# Patient Record
Sex: Male | Born: 1965
Health system: Southern US, Community
[De-identification: ages and names within clinical notes are randomized; demographics above are authoritative.]

## PROBLEM LIST (undated history)

## (undated) DIAGNOSIS — M109 Gout, unspecified: Secondary | ICD-10-CM

## (undated) DIAGNOSIS — G43909 Migraine, unspecified, not intractable, without status migrainosus: Secondary | ICD-10-CM

## (undated) DIAGNOSIS — F32A Depression, unspecified: Secondary | ICD-10-CM

## (undated) DIAGNOSIS — K589 Irritable bowel syndrome without diarrhea: Secondary | ICD-10-CM

## (undated) DIAGNOSIS — M67979 Unspecified disorder of synovium and tendon, unspecified ankle and foot: Secondary | ICD-10-CM

## (undated) DIAGNOSIS — E785 Hyperlipidemia, unspecified: Secondary | ICD-10-CM

## (undated) DIAGNOSIS — B019 Varicella without complication: Secondary | ICD-10-CM

## (undated) DIAGNOSIS — M545 Low back pain, unspecified: Secondary | ICD-10-CM

## (undated) DIAGNOSIS — M5000 Cervical disc disorder with myelopathy, unspecified cervical region: Secondary | ICD-10-CM

## (undated) DIAGNOSIS — G4700 Insomnia, unspecified: Secondary | ICD-10-CM

## (undated) DIAGNOSIS — G8929 Other chronic pain: Secondary | ICD-10-CM

## (undated) DIAGNOSIS — E119 Type 2 diabetes mellitus without complications: Secondary | ICD-10-CM

## (undated) DIAGNOSIS — Z8051 Family history of malignant neoplasm of kidney: Secondary | ICD-10-CM

## (undated) DIAGNOSIS — K219 Gastro-esophageal reflux disease without esophagitis: Secondary | ICD-10-CM

## (undated) DIAGNOSIS — Z8052 Family history of malignant neoplasm of bladder: Secondary | ICD-10-CM

## (undated) HISTORY — DX: Hyperlipidemia, unspecified: E78.5

## (undated) HISTORY — DX: Depression, unspecified: F32.A

## (undated) HISTORY — DX: Migraine, unspecified, not intractable, without status migrainosus: G43.909

## (undated) HISTORY — PX: BACK SURGERY: SHX140

## (undated) HISTORY — DX: Other chronic pain: G89.29

## (undated) HISTORY — DX: Cervical disc disorder with myelopathy, unspecified cervical region: M50.00

## (undated) HISTORY — DX: Gout, unspecified: M10.9

## (undated) HISTORY — DX: Low back pain, unspecified: M54.50

## (undated) HISTORY — DX: Insomnia, unspecified: G47.00

## (undated) HISTORY — DX: Type 2 diabetes mellitus without complications: E11.9

## (undated) HISTORY — DX: Unspecified disorder of synovium and tendon, unspecified ankle and foot: M67.979

## (undated) HISTORY — DX: Family history of malignant neoplasm of bladder: Z80.52

## (undated) HISTORY — DX: Family history of malignant neoplasm of kidney: Z80.51

## (undated) HISTORY — DX: Irritable bowel syndrome, unspecified: K58.9

## (undated) HISTORY — DX: Gastro-esophageal reflux disease without esophagitis: K21.9

## (undated) HISTORY — PX: OTHER SURGICAL HISTORY: SHX169

---
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
## 2001-03-16 HISTORY — PX: MOUTH SURGERY: SHX715

## 2001-04-22 DIAGNOSIS — M5106 Intervertebral disc disorders with myelopathy, lumbar region: Secondary | ICD-10-CM | POA: Insufficient documentation

## 2003-03-17 HISTORY — PX: ESOPHAGOGASTRODUODENOSCOPY: SHX1529

## 2003-09-27 DIAGNOSIS — M5430 Sciatica, unspecified side: Secondary | ICD-10-CM | POA: Insufficient documentation

## 2006-11-04 DIAGNOSIS — K219 Gastro-esophageal reflux disease without esophagitis: Secondary | ICD-10-CM | POA: Insufficient documentation

## 2006-11-04 DIAGNOSIS — G43909 Migraine, unspecified, not intractable, without status migrainosus: Secondary | ICD-10-CM | POA: Insufficient documentation

## 2012-03-16 HISTORY — PX: LUMBAR LAMINECTOMY: SHX95

## 2012-03-16 HISTORY — PX: LUMBAR EPIDURAL INJECTION: SHX1980

## 2014-01-31 DIAGNOSIS — F5101 Primary insomnia: Secondary | ICD-10-CM | POA: Insufficient documentation

## 2014-02-28 DIAGNOSIS — F320 Major depressive disorder, single episode, mild: Secondary | ICD-10-CM | POA: Insufficient documentation

## 2014-02-28 DIAGNOSIS — F411 Generalized anxiety disorder: Secondary | ICD-10-CM | POA: Insufficient documentation

## 2014-12-05 DIAGNOSIS — Z5181 Encounter for therapeutic drug level monitoring: Secondary | ICD-10-CM

## 2017-02-25 NOTE — Progress Notes (Deleted)
[facility]    Provider:  Dr Jaynee Eagles Referring Provider: Rowland Lathe, MD Primary Care Physician:  No primary care provider on file.  CC: Chronic migraine  HPI:  Carlos Ford is a 51 y.o. male here as a referral from Dr. Arnoldo Morale for chronic migraine.  Past medical history chronic migraine, insomnia, anxiety, depression, intervertebral cervical disc disorder with myelopathy, cervical region.  Patient's migraine started at the age of 40, significant triggers include shift work, food triggers, sleep and fatigue in addition to environmental allergies.  He is tried multiple classes of medications as documented below.  Patient reported the pain was located in the right temple, throbbing quality, max intensity 10 out of 10, duration was between 1-2 hours with treatment but up to 48 hours but not responding to treatment.  Initial frequency was 4-6/month, associated photophobia, phonophobia, osmophobia increased intensity with activity and nausea with rare vomiting.  No significant aura possibly on one occasion otherwise no aura.  Over the years he has had a gradual increase in the severity and frequency of his headaches up to 15 a month, he had one episode in 2004 of diplopia, unilateral weakness lasting 24 hours with a negative workup and no further episodes like this afterwards.  He has a family history of headaches.  Reviewed notes, labs and imaging from outside physicians, which showed:  Reviewed referral notes, patient has chronic migraines.  [facility] [place]  His last appointment was November 12, 2016 and prior to that April 2018.  It appears his migraine frequency varies from maximum of 15 days/month and a minimum of 2-4 days/month.  Unclear documentation, multiple notations for headache days per month, without an associated timeframe so documentation is very unclear, reviewed at least 20 pages of notes.  Patient reported  insomnia, lumbar surgery, chronic low back pain.  Medications tried: Gabapentin due to significant forgetfulness (forgot to pick up his kids from school) however it did help with both headache and lumbar pain, trial of nortriptyline for several weeks and had difficulties with his shift gradual, he has a history of caffeine overuse which he stopped in the past.  He continued to find food triggers, he is taking coenzyme Q10 and magnesium but the magnesium caused gastric upset, tolerated topiramate, was taking 2 Aleve with naratriptan as abortive therapy but had 2 episodes of gastric ulcer "flareups", also taking Aleve more than twice a week for chronic low back pain, complicated by shift work, trigger point injections, ibuprofen, ketorolac, Aleve, Flexeril, albuterol, diazepam, rarely hydrocodone and prednisone, he has tried sumatriptan hand with 100% resolution in 1-2 hours, frovatriptan and rizatriptan, Prozac, melatonin, Compazine, topiramate 100 mg Qudexy    MRI of the brain in 04, reviewed report which was unremarkable.  MRI of the cervical spine in 04 showed mild to moderate foraminal narrowing, reversal of cervical lordosis, spinal cord appeared normal, no significant stenosis.    Review of Systems: Patient complains of symptoms per HPI as well as the following symptoms ***. Pertinent negatives and positives per HPI. All others negative.   Social History   Socioeconomic History  . Marital status: Not on file    Spouse name: Not on file  . Number of children: Not on file  . Years of education: Not on file  . Highest education level: Not on file  Social Needs  . Financial resource strain: Not on file  . Food insecurity - worry: Not on file  .  Food insecurity - inability: Not on file  . Transportation needs - medical: Not on file  . Transportation needs - non-medical: Not on file  Occupational History  . Not on file  Tobacco Use  . Smoking status: Not on file  Substance and Sexual  Activity  . Alcohol use: Not on file  . Drug use: Not on file  . Sexual activity: Not on file  Other Topics Concern  . Not on file  Social History Narrative  . Not on file    No family history on file.  No past medical history on file.  *** The histories are not reviewed yet. Please review them in the "History" navigator section and refresh this [place].  No current outpatient medications on file.   No current facility-administered medications for this visit.     Allergies as of 02/26/2017  . (Not on File)    Vitals: There were no vitals taken for this visit. Last Weight:  Wt Readings from Last 1 Encounters:  No data found for Wt   Last Height:   Ht Readings from Last 1 Encounters:  No data found for Ht         Assessment/Plan:    @ordernmenc @  Sarina Ill, MD  [facility] [address] [place], [place] [address]  Phone [phone] Fax [phone]

## 2017-02-26 ENCOUNTER — Ambulatory Visit: Payer: Self-pay | Admitting: Neurology

## 2017-03-04 ENCOUNTER — Encounter: Payer: Self-pay | Admitting: Neurology

## 2017-03-16 DIAGNOSIS — Z860101 Personal history of adenomatous and serrated colon polyps: Secondary | ICD-10-CM

## 2017-03-16 DIAGNOSIS — Z86010 Personal history of colonic polyps: Secondary | ICD-10-CM

## 2017-03-16 HISTORY — DX: Personal history of colonic polyps: Z86.010

## 2017-03-16 HISTORY — DX: Personal history of adenomatous and serrated colon polyps: Z86.0101

## 2017-03-31 DIAGNOSIS — L03011 Cellulitis of right finger: Secondary | ICD-10-CM | POA: Diagnosis not present

## 2017-03-31 DIAGNOSIS — L03119 Cellulitis of unspecified part of limb: Secondary | ICD-10-CM | POA: Diagnosis not present

## 2017-04-20 ENCOUNTER — Telehealth: Payer: Self-pay | Admitting: *Deleted

## 2017-04-20 NOTE — Telephone Encounter (Signed)
Called patient and LVM informing patient that appt scheduled for Wednesday 2/6 @ 7:45 will need to be r/s. Left office number for patient to call to r/s.

## 2017-04-21 ENCOUNTER — Ambulatory Visit: Payer: Self-pay | Admitting: Neurology

## 2017-04-30 ENCOUNTER — Ambulatory Visit: Payer: Self-pay | Admitting: Neurology

## 2017-05-06 NOTE — Progress Notes (Signed)
[facility]    Provider:  Dr Jaynee Eagles Referring Provider: Rowland Lathe, MD Primary Care Physician:  Rowland Lathe, MD  CC:  Chronic migraines  HPI:  Carlos Ford is a 52 y.o. male here as a referral from Dr. Arnoldo Morale for headaches. 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 But in retrospect he has had migraines since a teenager. Imitrex helped initially, the migraines worsened, [facility]. No medication overuse. Vitamins have helped. He tried many medications (see below) and botox is the only thing that helped. Botox was tremendously helpful and he was completely resolved on the botox, 100% improvement. He can feel when it is wearing off the last week of the cycle. He has been off botox since November and the migraines are returning, he has 4-5 migraine days a week so approx 16 a month. He has one migraine that "crawls up the back", they may also start in the face and radiate, pulsating behind the eyes, light and sound sensitivity, nausea, vomiting. If untreated they can last more than 24 hours. No aura (extremely rare). Extensive family history of migraines. He gained 90 pounds due to migraines and back pain but no symptoms of sleep apnea.  Reviewed notes, labs and imaging from outside physicians, which showed:  Medications used: Acetaminophen, naratriptan, sumatriptan hand, Zomig nasal spray, bupropion, fluoxetine, sertraline, Aleve (gastric ulcers), Cambia (benefit on only one occasion), cyclobenzaprine, prochlorperazine, baclofen, sumatriptan, frovatriptan, rizatriptan,  Magnesium, coenzyme Q 10, melatonin at night, topiramate (paresthesias and cognitive issues switched to topiramate XR 100 mg with some mild word finding difficulty), gabapentin 100 mg twice daily, has not tried verapamil, nortriptyline (alterations in sleep cycle drowsiness), protriptyline at 2.5 mg (constipation), he tried gabapentin 300 mg at night which made  him forgetful, he is tried prednisone 12-day taper had no benefit but the second.  Botox 8 times with significant response.  No peripheral nerve blocks.  It appears patient's been working extensively with physician for migraines.  He is tried multiple medications as listed above.  He has been counseled about avoiding opioids, caffeine and tobacco use, limiting triptan's and over-the-counter medications.  He has had head pain since the age of 87, located in the right temple, throbbing with 10 out of 10 pain, last up to 48 hours but not responding to treatment, associated photophobia, phonophobia, osmophobia increased with intensity with activity and nausea with rare vomiting.  He sometimes has a 1 minute long bright circle of visual distortion just prior to the onset of headaches no other neurologic deficits.  He did have one episode in 2004 of diplopia, unilateral weakness lasting 24 hours with a negative workup and no further episodes.  He has bifacial pain nearly daily that is triggered by weather changes, no autonomic features started in 99.  It appears he only sleeps 5 hours per night, he has gained 90 pounds in the past 20 years.  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 At C3-4, this causes mild to moderate right foraminal  narrowing. Underlying disk protrusion at this level associated with  the uncovertebral degeneration is not excluded.  2) Reversal of cervical lordosis, likely related to muscular spasm.  3) Spinal cord appears normal.   MR brain ('04) IMPRESSIONS: No intracranial abnormalities.   PSG ('01) Mild OSA      His vitamin D level was low at 22.3, his  B12 level was 394 and he is now on thousand micrograms of B12 daily.  Review of Systems: Patient complains of symptoms per HPI as well as the following symptoms: migraines. Pertinent negatives and  positives per HPI. All others negative.   Social History   Socioeconomic History  . Marital status: Married    Spouse name: Not on file  . Number of children: Not on file  . Years of education: Not on file  . Highest education level: Not on file  Social Needs  . Financial resource strain: Not on file  . Food insecurity - worry: Not on file  . Food insecurity - inability: Not on file  . Transportation needs - medical: Not on file  . Transportation needs - non-medical: Not on file  Occupational History  . Not on file  Tobacco Use  . Smoking status: Never Smoker  . Smokeless tobacco: Former Network engineer and Sexual Activity  . Alcohol use: Yes    Comment: occasional  . Drug use: No  . Sexual activity: Not on file  Other Topics Concern  . Not on file  Social History Narrative  . Not on file    Family History  Problem Relation Age of Onset  . Migraines Mother   . Diabetes Father   . CAD Father   . Migraines Sister   . Migraines Sister     Past Medical History:  Diagnosis Date  . Anxiety   . GERD (gastroesophageal reflux disease)   . Hyperlipidemia   . IBS (irritable bowel syndrome)   . Insomnia   . Intervertebral cervical disc disorder with myelopathy, cervical region   . Major depressive disorder   . Migraines     Past Surgical History:  Procedure Laterality Date  . BACK SURGERY    . ESOPHAGOGASTRODUODENOSCOPY  2005  . LUMBAR EPIDURAL INJECTION  2014   L4-5  . LUMBAR LAMINECTOMY  2014  . MOUTH SURGERY  2003  . STOMACH SURGERY     Age 53. Traumatic abdominal wall wound - required blood transfusion    Current Outpatient Medications  Medication Sig Dispense Refill  . Cholecalciferol (VITAMIN D3) 1000 units CAPS Take by mouth.    . cyclobenzaprine (FLEXERIL) 10 MG tablet Take 1 tablet (10 mg total) by mouth 3 (three) times daily as needed for muscle spasms. 30 tablet 3  . gabapentin (NEURONTIN) 100 MG capsule Take 1 capsule (100 mg total) by mouth 2  (two) times daily. 180 capsule 3  . MAGNESIUM PO Take by mouth.    . naratriptan (AMERGE) 2.5 MG tablet Take one (1) tablet at onset of headache; if returns or does not resolve, may repeat after 2 hours; do not exceed five (5) mg in 24 hours. 10 tablet 11  . omeprazole (PRILOSEC) 20 MG capsule Take 20 mg by mouth daily.    . Topiramate ER (QUDEXY XR) 100 MG CS24 sprinkle capsule Take 1 capsule by mouth at bedtime. 90 each 4  . vitamin B-12 (CYANOCOBALAMIN) 500 MCG tablet Take 1,000 mcg by mouth daily.    . OnabotulinumtoxinA (BOTOX IJ) Inject as directed.    . prochlorperazine (COMPAZINE) 10 MG tablet Take 1 tablet (10 mg total) by mouth every 8 (eight) hours as needed for nausea or vomiting. 30 tablet 11   No current facility-administered medications for this visit.     Allergies as of 05/07/2017 - Review Complete 05/07/2017  Allergen Reaction Noted  . Pseudoephedrine Palpitations 05/06/2017  . Sulfa antibiotics Rash  05/06/2017  . Wellbutrin [bupropion] Other (See Comments) 05/06/2017    Vitals: BP 126/85   Pulse 75   Ht 5\' 9"  (1.753 m)   Wt 231 lb (104.8 kg)   BMI 34.11 kg/m  Last Weight:  Wt Readings from Last 1 Encounters:  05/07/17 231 lb (104.8 kg)   Last Height:   Ht Readings from Last 1 Encounters:  05/07/17 5\' 9"  (1.753 m)    Physical exam: Exam: Gen: NAD, conversant, well nourised, obese, well groomed                     CV: RRR, no MRG. No Carotid Bruits. No peripheral edema, warm, nontender Eyes: Conjunctivae clear without exudates or hemorrhage  Neuro: Detailed Neurologic Exam  Speech:    Speech is normal; fluent and spontaneous with normal comprehension.  Cognition:    The patient is oriented to person, place, and time;     recent and remote memory intact;     language fluent;     normal attention, concentration,     fund of knowledge Cranial Nerves:    The pupils are equal, round, and reactive to light. The fundi are normal and spontaneous venous  pulsations are present. Visual fields are full to finger confrontation. Extraocular movements are intact. Trigeminal sensation is intact and the muscles of mastication are normal. The face is symmetric. The palate elevates in the midline. Hearing intact. Voice is normal. Shoulder shrug is normal. The tongue has normal motion without fasciculations.   Coordination:    Normal finger to nose and heel to shin. Normal rapid alternating movements.   Gait:    Heel-toe and tandem gait are normal.   Motor Observation:    No asymmetry, no atrophy, and no involuntary movements noted. Tone:    Normal muscle tone.    Posture:    Posture is normal. normal erect    Strength:    Strength is V/V in the upper and lower limbs.      Sensation: intact to LT     Reflex Exam:  DTR's:    Deep tendon reflexes in the upper and lower extremities are normal bilaterally.   Toes:    The toes are downgoing bilaterally.   Clonus:    Clonus is absent.      Assessment/Plan:   Patient with Chronic migraines failed all first, second and many third-line medications. Did extremely well with Botox almost 100% resolution will restart.  - Botox for migraines - Discussed Ajovy and the new CGRP medications (provided samples today and education)  Discussed: To prevent or relieve headaches, try the following: Cool Compress. Lie down and place a cool compress on your head.  Avoid headache triggers. If certain foods or odors seem to have triggered your migraines in the past, avoid them. A headache diary might help you identify triggers.  Include physical activity in your daily routine. Try a daily walk or other moderate aerobic exercise.  Manage stress. Find healthy ways to cope with the stressors, such as delegating tasks on your to-do list.  Practice relaxation techniques. Try deep breathing, yoga, massage and visualization.  Eat regularly. Eating regularly scheduled meals and maintaining a healthy diet might help  prevent headaches. Also, drink plenty of fluids.  Follow a regular sleep schedule. Sleep deprivation might contribute to headaches Consider biofeedback. With this mind-body technique, you learn to control certain bodily functions - such as muscle tension, heart rate and blood pressure - to prevent headaches or  reduce headache pain.    Proceed to emergency room if you experience new or worsening symptoms or symptoms do not resolve, if you have new neurologic symptoms or if headache is severe, or for any concerning symptom.   Provided education and documentation from American headache Society toolbox including articles on: chronic migraine medication overuse headache, chronic migraines, prevention of migraines, behavioral and other nonpharmacologic treatments for headache.   Cc: [place] Lathe, MD    Sarina Ill, MD  [facility] [address] [place], [place] [address]  Phone [phone] Fax [phone]

## 2017-05-07 ENCOUNTER — Encounter: Payer: Self-pay | Admitting: Neurology

## 2017-05-07 ENCOUNTER — Ambulatory Visit: Payer: BLUE CROSS/BLUE SHIELD | Admitting: Neurology

## 2017-05-07 ENCOUNTER — Telehealth: Payer: Self-pay | Admitting: Neurology

## 2017-05-07 VITALS — BP 126/85 | HR 75 | Ht 69.0 in | Wt 231.0 lb

## 2017-05-07 DIAGNOSIS — G43709 Chronic migraine without aura, not intractable, without status migrainosus: Secondary | ICD-10-CM | POA: Diagnosis not present

## 2017-05-07 MED ORDER — TOPIRAMATE ER 100 MG PO SPRINKLE CAP24: 1.0000 | EXTENDED_RELEASE_CAPSULE | Freq: Every day | ORAL | 4 refills | Status: DC

## 2017-05-07 MED ORDER — CYCLOBENZAPRINE HCL 10 MG PO TABS: 10.0000 mg | ORAL_TABLET | Freq: Three times a day (TID) | ORAL | 3 refills | Status: DC | PRN

## 2017-05-07 MED ORDER — PROCHLORPERAZINE MALEATE 10 MG PO TABS: 10.0000 mg | ORAL_TABLET | Freq: Three times a day (TID) | ORAL | 11 refills | Status: DC | PRN

## 2017-05-07 MED ORDER — GABAPENTIN 100 MG PO CAPS: 100.0000 mg | ORAL_CAPSULE | Freq: Two times a day (BID) | ORAL | 3 refills | Status: DC

## 2017-05-07 MED ORDER — NARATRIPTAN HCL 2.5 MG PO TABS: 2.5000 mg | ORAL_TABLET | ORAL | 11 refills | Status: DC | PRN

## 2017-05-07 MED ORDER — NARATRIPTAN HCL 2.5 MG PO TABS: ORAL_TABLET | ORAL | 11 refills | Status: DC

## 2017-05-07 NOTE — Patient Instructions (Addendum)
Will initiate botox Continue current meds Ajovy  Fremanezumab: Patient drug information Access [employer] Online here. Copyright [phone] [employer] All rights reserved. (For additional information see "Fremanezumab: Drug information") Brand Names: [place]  Ajovy  What is this drug used for?   It is used to prevent migraine headaches.  What do I need to tell my doctor BEFORE I take this drug?   If you have an allergy to this drug or any part of this drug.   If you are allergic to any drugs like this one, any other drugs, foods, or other substances. Tell your doctor about the allergy and what signs you had, like rash; hives; itching; shortness of breath; wheezing; cough; swelling of face, lips, tongue, or throat; or any other signs.   This drug may interact with other drugs or health problems.   Tell your doctor and pharmacist about all of your drugs (prescription or OTC, natural products, vitamins) and health problems. You must check to make sure that it is safe for you to take this drug with all of your drugs and health problems. Do not start, stop, or change the dose of any drug without checking with your doctor.  What are some things I need to know or do while I take this drug?   Tell all of your health care providers that you take this drug. This includes your doctors, nurses, pharmacists, and dentists.   Allergic reactions have happened within hours and up to 1 month after this drug was given. Tell your doctor right away about any bad effects after getting this drug.   Tell your doctor if you are pregnant or plan on getting pregnant. You will need to talk about the benefits and risks of using this drug while you are pregnant.   Tell your doctor if you are breast-feeding. You will need to talk about any risks to your baby.  What are some side effects that I need to call my doctor about right away?   WARNING/CAUTION: Even though it may be rare, some people may have very bad and sometimes  deadly side effects when taking a drug. Tell your doctor or get medical help right away if you have any of the following signs or symptoms that may be related to a very bad side effect:   Signs of an allergic reaction, like rash; hives; itching; red, swollen, blistered, or peeling skin with or without fever; wheezing; tightness in the chest or throat; trouble breathing, swallowing, or talking; unusual hoarseness; or swelling of the mouth, face, lips, tongue, or throat.   Very bad irritation where the shot was given.  What are some other side effects of this drug?   All drugs may cause side effects. However, many people have no side effects or only have minor side effects. Call your doctor or get medical help if any of these side effects or any other side effects bother you or do not go away:   Irritation where the shot is given.   These are not all of the side effects that may occur. If you have questions about side effects, call your doctor. Call your doctor for medical advice about side effects.   You may report side effects to your national health agency.  How is this drug best taken?   Use this drug as ordered by your doctor. Read all information given to you. Follow all instructions closely.   It is given as a shot into the fatty part of the skin  on the top of the thigh, belly area, or upper arm.   If you will be giving yourself the shot, your doctor or nurse will teach you how to give the shot.   Follow how to use as you have been told by the doctor or read the package insert.   Wash your hands before use.   If stored in a refrigerator, let this drug come to room temperature before using it. Leave it at room temperature for at least 30 minutes. Do not heat this drug.   Do not shake.   Do not give into skin that is irritated, bruised, red, infected, or scarred.   Do not give into the same place as another shot.   If your dose is more than 1 injection, you may give it in the same body part.  Make sure you do not give injections in the same spot you used for the other injections.   Do not use if the solution is cloudy, leaking, or has particles.   This drug is colorless to a faint yellow. Do not use if the solution changes color.   Each prefilled syringe is for one use only.   Throw away needles in a needle/sharp disposal box. Do not reuse needles or other items. When the box is full, follow all local rules for getting rid of it. Talk with a doctor or pharmacist if you have any questions.  What do I do if I miss a dose?   Take a missed dose as soon as you think about it.   After taking a missed dose, start a new schedule based on when the dose is taken.  How do I store and/or throw out this drug?   Store in a refrigerator. Do not freeze.   Store in the carton to protect from light.   If needed, this drug can be left out at room temperature for up to 24 hours. Throw away drug if left at room temperature for more than 24 hours.   Protect from heat and sunlight.   Keep all drugs in a safe place. Keep all drugs out of the reach of children and pets.   Throw away unused or expired drugs. Do not flush down a toilet or pour down a drain unless you are told to do so. Check with your pharmacist if you have questions about the best way to throw out drugs. There may be drug take-back programs in your area.  General drug facts   If your symptoms or health problems do not get better or if they become worse, call your doctor.   Do not share your drugs with others and do not take anyone else's drugs.   Keep a list of all your drugs (prescription, natural products, vitamins, OTC) with you. Give this list to your doctor.   Talk with the doctor before starting any new drug, including prescription or OTC, natural products, or vitamins.   Some drugs may have another patient information leaflet. If you have any questions about this drug, please talk with your doctor, nurse, pharmacist, or other health  care provider.   If you think there has been an overdose, call your poison control center or get medical care right away. Be ready to tell or show what was taken, how much, and when it happened.

## 2017-05-07 NOTE — Telephone Encounter (Signed)
I called to schedule the patient for his botox injection, he did not answer so I left a VM asking him to call me back.

## 2017-05-27 ENCOUNTER — Telehealth: Payer: Self-pay | Admitting: Neurology

## 2017-05-27 NOTE — Telephone Encounter (Signed)
I called to schedule the patient patient but he did not answer so I left a VM asking him to call me back.

## 2017-05-27 NOTE — Telephone Encounter (Signed)
Pt returning call please contact at [phone] or [phone]

## 2017-06-17 NOTE — Telephone Encounter (Signed)
I called the patient to make him aware he needed to call [facility] to give consent for his medication. He did not answer so I left a VM asking him to call [facility] and giving him their phone number (ok per DPR). If he happens to call back the phone number is [phone].

## 2017-06-17 NOTE — Telephone Encounter (Signed)
Botox authorization [id number] and [id number] valid through (12/11/17).   Called to check status of medication at [facility] and it was still pending.

## 2017-06-22 ENCOUNTER — Encounter: Payer: Self-pay | Admitting: Neurology

## 2017-06-22 ENCOUNTER — Encounter (INDEPENDENT_AMBULATORY_CARE_PROVIDER_SITE_OTHER): Payer: Self-pay

## 2017-06-22 ENCOUNTER — Telehealth: Payer: Self-pay | Admitting: Neurology

## 2017-06-22 ENCOUNTER — Ambulatory Visit: Payer: BLUE CROSS/BLUE SHIELD | Admitting: Neurology

## 2017-06-22 VITALS — BP 131/86 | HR 81

## 2017-06-22 DIAGNOSIS — G43709 Chronic migraine without aura, not intractable, without status migrainosus: Secondary | ICD-10-CM

## 2017-06-22 NOTE — Progress Notes (Signed)
+temples. Not masseters. +levator scapulae. Not eyes.  Consent Form Botulism Toxin Injection For Chronic Migraine    Reviewed orally with patient, additionally signature is on file:  Botulism toxin has been approved by the Federal drug administration for treatment of chronic migraine. Botulism toxin does not cure chronic migraine and it may not be effective in some patients.  The administration of botulism toxin is accomplished by injecting a small amount of toxin into the muscles of the neck and head. Dosage must be titrated for each individual. Any benefits resulting from botulism toxin tend to wear off after 3 months with a repeat injection required if benefit is to be maintained. Injections are usually done every 3-4 months with maximum effect peak achieved by about 2 or 3 weeks. Botulism toxin is expensive and you should be sure of what costs you will incur resulting from the injection.  The side effects of botulism toxin use for chronic migraine may include:   -Transient, and usually mild, facial weakness with facial injections  -Transient, and usually mild, head or neck weakness with head/neck injections  -Reduction or loss of forehead facial animation due to forehead muscle weakness  -Eyelid drooping  -Dry eye  -Pain at the site of injection or bruising at the site of injection  -Double vision  -Potential unknown long term risks  Contraindications: You should not have Botox if you are pregnant, nursing, allergic to albumin, have an infection, skin condition, or muscle weakness at the site of the injection, or have myasthenia gravis, Lambert-Eaton syndrome, or ALS.  It is also possible that as with any injection, there may be an allergic reaction or no effect from the medication. Reduced effectiveness after repeated injections is sometimes seen and rarely infection at the injection site may occur. All care will be taken to prevent these side effects. If therapy is given over a  long time, atrophy and wasting in the muscle injected may occur. Occasionally the patient's become refractory to treatment because they develop antibodies to the toxin. In this event, therapy needs to be modified.  I have read the above information and consent to the administration of botulism toxin.    BOTOX PROCEDURE NOTE FOR MIGRAINE HEADACHE    Contraindications and precautions discussed with patient(above). Aseptic procedure was observed and patient tolerated procedure. Procedure performed by Dr. Georgia Dom  The condition has existed for more than 6 months, and pt does not have a diagnosis of ALS, Myasthenia Gravis or Lambert-Eaton Syndrome.  Risks and benefits of injections discussed and pt agrees to proceed with the procedure.  Written consent obtained  These injections are medically necessary. Pt  receives good benefits from these injections. These injections do not cause sedations or hallucinations which the oral therapies may cause.  Indication/Diagnosis: chronic migraine BOTOX(J0585) injection was performed according to protocol by [employer]. 200 units of BOTOX was dissolved into 4 cc NS.   NDC: [address]-15   Description of procedure:  The patient was placed in a sitting position. The standard protocol was used for Botox as follows, with 5 units of Botox injected at each site:   -Procerus muscle, midline injection  -Corrugator muscle, bilateral injection  -Frontalis muscle, bilateral injection, with 2 sites each side, medial injection was performed in the upper one third of the frontalis muscle, in the region vertical from the medial inferior edge of the superior orbital rim. The lateral injection was again in the upper one third of the forehead vertically above the lateral limbus of  the cornea, 1.5 cm lateral to the medial injection site.  -Temporalis muscle injection, 4 sites, bilaterally. The first injection was 3 cm above the tragus of the ear, second injection site  was 1.5 cm to 3 cm up from the first injection site in line with the tragus of the ear. The third injection site was 1.5-3 cm forward between the first 2 injection sites. The fourth injection site was 1.5 cm posterior to the second injection site.  -Occipitalis muscle injection, 3 sites, bilaterally. The first injection was done one half way between the occipital protuberance and the tip of the mastoid process behind the ear. The second injection site was done lateral and superior to the first, 1 fingerbreadth from the first injection. The third injection site was 1 fingerbreadth superiorly and medially from the first injection site.  -Cervical paraspinal muscle injection, 2 sites, bilateral knee first injection site was 1 cm from the midline of the cervical spine, 3 cm inferior to the lower border of the occipital protuberance. The second injection site was 1.5 cm superiorly and laterally to the first injection site.  -Trapezius muscle injection was performed at 3 sites, bilaterally. The first injection site was in the upper trapezius muscle halfway between the inflection point of the neck, and the acromion. The second injection site was one half way between the acromion and the first injection site. The third injection was done between the first injection site and the inflection point of the neck.   Will return for repeat injection in 3 months.   A 200 unit sof Botox was used, 155 units were injected, the rest of the Botox was wasted. The patient tolerated the procedure well, there were no complications of the above procedure.

## 2017-06-22 NOTE — Telephone Encounter (Signed)
12 wk Botox inj °

## 2017-06-22 NOTE — Progress Notes (Signed)
Botox- 100 units x 2 vials Lot: [id number] Expiration: 11/2019 NDC: 6190-1222-41  Bacteriostatic 0.9% Sodium Chloride- 3mL total Lot: [id number] Expiration: 07/15/2018 NDC: 4276-7011-00  Dx: P49.611 B/B //BCrn

## 2017-06-24 NOTE — Telephone Encounter (Signed)
I tried to call the patient to schedule the next injection. His phone number gave me a disconnected error.

## 2017-07-14 NOTE — Telephone Encounter (Signed)
Scheduled patient for Botox injection with Dr. Jaynee Eagles on 09-27-17 at 3:30pm check in at 3pm. I checked his telephone number and it is correct.

## 2017-07-20 NOTE — Telephone Encounter (Signed)
Noted, thank you

## 2017-09-13 NOTE — Telephone Encounter (Signed)
I called to initiate refill request for the patients Botox medication. I spoke with pharmacist Sam who started the request.

## 2017-09-20 NOTE — Telephone Encounter (Signed)
I called to check status of the medication but it was still pending insurance verification.

## 2017-09-21 DIAGNOSIS — G43709 Chronic migraine without aura, not intractable, without status migrainosus: Secondary | ICD-10-CM | POA: Diagnosis not present

## 2017-09-21 NOTE — Telephone Encounter (Signed)
[facility] call and we scheduled the patient's medication for delivery.

## 2017-09-27 ENCOUNTER — Ambulatory Visit (INDEPENDENT_AMBULATORY_CARE_PROVIDER_SITE_OTHER): Payer: BLUE CROSS/BLUE SHIELD | Admitting: Neurology

## 2017-09-27 ENCOUNTER — Telehealth: Payer: Self-pay | Admitting: Neurology

## 2017-09-27 ENCOUNTER — Encounter: Payer: Self-pay | Admitting: Neurology

## 2017-09-27 VITALS — BP 122/80 | HR 84

## 2017-09-27 DIAGNOSIS — R339 Retention of urine, unspecified: Secondary | ICD-10-CM

## 2017-09-27 DIAGNOSIS — G43709 Chronic migraine without aura, not intractable, without status migrainosus: Secondary | ICD-10-CM

## 2017-09-27 DIAGNOSIS — R200 Anesthesia of skin: Secondary | ICD-10-CM

## 2017-09-27 DIAGNOSIS — M5416 Radiculopathy, lumbar region: Secondary | ICD-10-CM | POA: Diagnosis not present

## 2017-09-27 NOTE — Progress Notes (Signed)
+temples. Not masseters. +levator scapulae. Not eyes.   Second injection, >50% reduction in migraine frequency, excellent res[ponse  Consent Form Botulism Toxin Injection For Chronic Migraine    Reviewed orally with patient, additionally signature is on file:  Botulism toxin has been approved by the Federal drug administration for treatment of chronic migraine. Botulism toxin does not cure chronic migraine and it may not be effective in some patients.  The administration of botulism toxin is accomplished by injecting a small amount of toxin into the muscles of the neck and head. Dosage must be titrated for each individual. Any benefits resulting from botulism toxin tend to wear off after 3 months with a repeat injection required if benefit is to be maintained. Injections are usually done every 3-4 months with maximum effect peak achieved by about 2 or 3 weeks. Botulism toxin is expensive and you should be sure of what costs you will incur resulting from the injection.  The side effects of botulism toxin use for chronic migraine may include:   -Transient, and usually mild, facial weakness with facial injections  -Transient, and usually mild, head or neck weakness with head/neck injections  -Reduction or loss of forehead facial animation due to forehead muscle weakness  -Eyelid drooping  -Dry eye  -Pain at the site of injection or bruising at the site of injection  -Double vision  -Potential unknown long term risks  Contraindications: You should not have Botox if you are pregnant, nursing, allergic to albumin, have an infection, skin condition, or muscle weakness at the site of the injection, or have myasthenia gravis, Lambert-Eaton syndrome, or ALS.  It is also possible that as with any injection, there may be an allergic reaction or no effect from the medication. Reduced effectiveness after repeated injections is sometimes seen and rarely infection at the injection site may occur. All  care will be taken to prevent these side effects. If therapy is given over a long time, atrophy and wasting in the muscle injected may occur. Occasionally the patient's become refractory to treatment because they develop antibodies to the toxin. In this event, therapy needs to be modified.  I have read the above information and consent to the administration of botulism toxin.    BOTOX PROCEDURE NOTE FOR MIGRAINE HEADACHE    Contraindications and precautions discussed with patient(above). Aseptic procedure was observed and patient tolerated procedure. Procedure performed by Dr. Georgia Dom  The condition has existed for more than 6 months, and pt does not have a diagnosis of ALS, Myasthenia Gravis or Lambert-Eaton Syndrome.  Risks and benefits of injections discussed and pt agrees to proceed with the procedure.  Written consent obtained  These injections are medically necessary. Pt  receives good benefits from these injections. These injections do not cause sedations or hallucinations which the oral therapies may cause.  Indication/Diagnosis: chronic migraine BOTOX(J0585) injection was performed according to protocol by [employer]. 200 units of BOTOX was dissolved into 4 cc NS.   NDC: [address]-82   Description of procedure:  The patient was placed in a sitting position. The standard protocol was used for Botox as follows, with 5 units of Botox injected at each site:   -Procerus muscle, midline injection  -Corrugator muscle, bilateral injection  -Frontalis muscle, bilateral injection, with 2 sites each side, medial injection was performed in the upper one third of the frontalis muscle, in the region vertical from the medial inferior edge of the superior orbital rim. The lateral injection was again in the upper  one third of the forehead vertically above the lateral limbus of the cornea, 1.5 cm lateral to the medial injection site.  -Temporalis muscle injection, 4 sites, bilaterally. The  first injection was 3 cm above the tragus of the ear, second injection site was 1.5 cm to 3 cm up from the first injection site in line with the tragus of the ear. The third injection site was 1.5-3 cm forward between the first 2 injection sites. The fourth injection site was 1.5 cm posterior to the second injection site.  -Occipitalis muscle injection, 3 sites, bilaterally. The first injection was done one half way between the occipital protuberance and the tip of the mastoid process behind the ear. The second injection site was done lateral and superior to the first, 1 fingerbreadth from the first injection. The third injection site was 1 fingerbreadth superiorly and medially from the first injection site.  -Cervical paraspinal muscle injection, 2 sites, bilateral knee first injection site was 1 cm from the midline of the cervical spine, 3 cm inferior to the lower border of the occipital protuberance. The second injection site was 1.5 cm superiorly and laterally to the first injection site.  -Trapezius muscle injection was performed at 3 sites, bilaterally. The first injection site was in the upper trapezius muscle halfway between the inflection point of the neck, and the acromion. The second injection site was one half way between the acromion and the first injection site. The third injection was done between the first injection site and the inflection point of the neck.   Will return for repeat injection in 3 months.   A 200 unit sof Botox was used, 155 units were injected, the rest of the Botox was wasted. The patient tolerated the procedure well, there were no complications of the above procedure.

## 2017-09-27 NOTE — Telephone Encounter (Signed)
12 wk botox inj °

## 2017-09-27 NOTE — Progress Notes (Signed)
[facility]    Provider:  Dr Jaynee Eagles Referring Provider: No ref. provider found Primary Care Physician:  Rowland Lathe, MD  CC:  Chronic migraines  Interval history: Patient returns today for a new problem, chronic low back pain, radicular symptoms, new saddle anesthesia and urinary retention. He has a PMHx of l4-l5 surgery and back is progressively worsening, has trialed >6 months of conservative measures but now has concerning symptoms feel need MRI lumbar spine to evaluate for cauda equina.  HPI:  Carlos Ford is a 52 y.o. male here as a referral from Dr. No ref. provider found for headaches. 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 But in retrospect he has had migraines since a teenager. Imitrex helped initially, the migraines worsened, [facility]. No medication overuse. Vitamins have helped. He tried many medications (see below) and botox is the only thing that helped. Botox was tremendously helpful and he was completely resolved on the botox, 100% improvement. He can feel when it is wearing off the last week of the cycle. He has been off botox since November and the migraines are returning, he has 4-5 migraine days a week so approx 16 a month. He has one migraine that "crawls up the back", they may also start in the face and radiate, pulsating behind the eyes, light and sound sensitivity, nausea, vomiting. If untreated they can last more than 24 hours. No aura (extremely rare). Extensive family history of migraines. He gained 90 pounds due to migraines and back pain but no symptoms of sleep apnea.  Reviewed notes, labs and imaging from outside physicians, which showed:  Medications used: Acetaminophen, naratriptan, sumatriptan hand, Zomig nasal spray, bupropion, fluoxetine, sertraline, Aleve (gastric ulcers), Cambia (benefit on only one occasion), cyclobenzaprine, prochlorperazine, baclofen, sumatriptan, frovatriptan,  rizatriptan,  Magnesium, coenzyme Q 10, melatonin at night, topiramate (paresthesias and cognitive issues switched to topiramate XR 100 mg with some mild word finding difficulty), gabapentin 100 mg twice daily, has not tried verapamil, nortriptyline (alterations in sleep cycle drowsiness), protriptyline at 2.5 mg (constipation), he tried gabapentin 300 mg at night which made him forgetful, he is tried prednisone 12-day taper had no benefit but the second.  Botox 8 times with significant response.  No peripheral nerve blocks.  It appears patient's been working extensively with physician for migraines.  He is tried multiple medications as listed above.  He has been counseled about avoiding opioids, caffeine and tobacco use, limiting triptan's and over-the-counter medications.  He has had head pain since the age of 16, located in the right temple, throbbing with 10 out of 10 pain, last up to 48 hours but not responding to treatment, associated photophobia, phonophobia, osmophobia increased with intensity with activity and nausea with rare vomiting.  He sometimes has a 1 minute long bright circle of visual distortion just prior to the onset of headaches no other neurologic deficits.  He did have one episode in 2004 of diplopia, unilateral weakness lasting 24 hours with a negative workup and no further episodes.  He has bifacial pain nearly daily that is triggered by weather changes, no autonomic features started in 99.  It appears he only sleeps 5 hours per night, he has gained 90 pounds in the past 20 years.  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 At C3-4, this causes mild to moderate right foraminal  narrowing. Underlying disk  protrusion at this level associated with  the uncovertebral degeneration is not excluded.  2) Reversal of cervical lordosis, likely related to muscular spasm.   3) Spinal cord appears normal.   MR brain ('04) IMPRESSIONS: No intracranial abnormalities.   PSG ('01) Mild OSA      His vitamin D level was low at 22.3, his B12 level was 394 and he is now on thousand micrograms of B12 daily.  Review of Systems: Patient complains of symptoms per HPI as well as the following symptoms: migraines. Pertinent negatives and positives per HPI. All others negative.   Social History   Socioeconomic History  . Marital status: Married    Spouse name: Not on file  . Number of children: Not on file  . Years of education: Not on file  . Highest education level: Not on file  Occupational History  . Not on file  Social Needs  . Financial resource strain: Not on file  . Food insecurity:    Worry: Not on file    Inability: Not on file  . Transportation needs:    Medical: Not on file    Non-medical: Not on file  Tobacco Use  . Smoking status: Never Smoker  . Smokeless tobacco: Former Network engineer and Sexual Activity  . Alcohol use: Yes    Comment: occasional  . Drug use: No  . Sexual activity: Not on file  Lifestyle  . Physical activity:    Days per week: Not on file    Minutes per session: Not on file  . Stress: Not on file  Relationships  . Social connections:    Talks on phone: Not on file    Gets together: Not on file    Attends religious service: Not on file    Active member of club or organization: Not on file    Attends meetings of clubs or organizations: Not on file    Relationship status: Not on file  . Intimate partner violence:    Fear of current or ex partner: Not on file    Emotionally abused: Not on file    Physically abused: Not on file    Forced sexual activity: Not on file  Other Topics Concern  . Not on file  Social History Narrative  . Not on file    Family History  Problem Relation Age of Onset  . Migraines Mother   . Diabetes Father   . CAD Father   . Migraines Sister   . Migraines Sister      Past Medical History:  Diagnosis Date  . Anxiety   . GERD (gastroesophageal reflux disease)   . Hyperlipidemia   . IBS (irritable bowel syndrome)   . Insomnia   . Intervertebral cervical disc disorder with myelopathy, cervical region   . Major depressive disorder   . Migraines     Past Surgical History:  Procedure Laterality Date  . BACK SURGERY    . ESOPHAGOGASTRODUODENOSCOPY  2005  . LUMBAR EPIDURAL INJECTION  2014   L4-5  . LUMBAR LAMINECTOMY  2014  . MOUTH SURGERY  2003  . STOMACH SURGERY     Age 85. Traumatic abdominal wall wound - required blood transfusion    Current Outpatient Medications  Medication Sig Dispense Refill  . Cholecalciferol (VITAMIN D3) 1000 units CAPS Take by mouth.    . Coenzyme Q10 (CO Q 10 PO) Take 500 mg by mouth daily.    . cyclobenzaprine (FLEXERIL) 10 MG tablet Take 1 tablet (  10 mg total) by mouth 3 (three) times daily as needed for muscle spasms. 30 tablet 3  . gabapentin (NEURONTIN) 100 MG capsule Take 1 capsule (100 mg total) by mouth 2 (two) times daily. 180 capsule 3  . MAGNESIUM PO Take 1,000 mg by mouth.     . naratriptan (AMERGE) 2.5 MG tablet Take one (1) tablet at onset of headache; if returns or does not resolve, may repeat after 2 hours; do not exceed five (5) mg in 24 hours. 10 tablet 11  . omeprazole (PRILOSEC) 20 MG capsule Take 20 mg by mouth daily.    . OnabotulinumtoxinA (BOTOX IJ) Inject as directed.    . prochlorperazine (COMPAZINE) 10 MG tablet Take 1 tablet (10 mg total) by mouth every 8 (eight) hours as needed for nausea or vomiting. 30 tablet 11  . Topiramate ER (QUDEXY XR) 100 MG CS24 sprinkle capsule Take 1 capsule by mouth at bedtime. 90 each 4  . vitamin B-12 (CYANOCOBALAMIN) 500 MCG tablet Take 1,000 mcg by mouth daily.     No current facility-administered medications for this visit.     Allergies as of 09/27/2017 - Review Complete 09/27/2017  Allergen Reaction Noted  . Pseudoephedrine Palpitations 05/06/2017   . Sulfa antibiotics Rash 05/06/2017  . Wellbutrin [bupropion] Other (See Comments) 05/06/2017    Vitals: BP 122/80 (BP Location: Left Arm, Patient Position: Sitting)   Pulse 84  Last Weight:  Wt Readings from Last 1 Encounters:  05/07/17 231 lb (104.8 kg)   Last Height:   Ht Readings from Last 1 Encounters:  05/07/17 5\' 9"  (1.753 m)    Physical exam: Exam: Gen: NAD, conversant, well nourised, obese, well groomed                     CV: RRR, no MRG. No Carotid Bruits. No peripheral edema, warm, nontender Eyes: Conjunctivae clear without exudates or hemorrhage  Neuro: Detailed Neurologic Exam  Speech:    Speech is normal; fluent and spontaneous with normal comprehension.  Cognition:    The patient is oriented to person, place, and time;     recent and remote memory intact;     language fluent;     normal attention, concentration,     fund of knowledge Cranial Nerves:    The pupils are equal, round, and reactive to light. The fundi are normal and spontaneous venous pulsations are present. Visual fields are full to finger confrontation. Extraocular movements are intact. Trigeminal sensation is intact and the muscles of mastication are normal. The face is symmetric. The palate elevates in the midline. Hearing intact. Voice is normal. Shoulder shrug is normal. The tongue has normal motion without fasciculations.   Coordination:    Normal finger to nose and heel to shin. Normal rapid alternating movements.   Gait:    Heel-toe and tandem gait are normal.   Motor Observation:    No asymmetry, no atrophy, and no involuntary movements noted. Tone:    Normal muscle tone.    Posture:    Posture is normal. normal erect    Strength: weakness 5- left foot dorsiflexion otherwise strength is V/V in the upper and lower limbs.      Sensation: intact to LT     Reflex Exam:  DTR's:    Left Aj absent Toes:    The toes are downgoing bilaterally.   Clonus:    Clonus is  absent.      Assessment/Plan:   Patient with Chronic migraines  failed all first, second and many third-line medications. Did extremely well with Botox almost 100% resolution will restart. New problem acute on chronic lumbar radiculopathy.   Patient returns today for a new problem, chronic low back pain, radicular symptoms, new saddle anesthesia and urinary retention. He has a PMHx of l4-l5 surgery and back is progressively worsening, has trialed >6 months of conservative measures but now has concerning symptoms feel need MRI lumbar spine to evaluate for cauda equina.    - Botox for migraines - Discussed Ajovy and the new CGRP medications (provided samples today and education)  Discussed: To prevent or relieve headaches, try the following: Cool Compress. Lie down and place a cool compress on your head.  Avoid headache triggers. If certain foods or odors seem to have triggered your migraines in the past, avoid them. A headache diary might help you identify triggers.  Include physical activity in your daily routine. Try a daily walk or other moderate aerobic exercise.  Manage stress. Find healthy ways to cope with the stressors, such as delegating tasks on your to-do list.  Practice relaxation techniques. Try deep breathing, yoga, massage and visualization.  Eat regularly. Eating regularly scheduled meals and maintaining a healthy diet might help prevent headaches. Also, drink plenty of fluids.  Follow a regular sleep schedule. Sleep deprivation might contribute to headaches Consider biofeedback. With this mind-body technique, you learn to control certain bodily functions - such as muscle tension, heart rate and blood pressure - to prevent headaches or reduce headache pain.    Proceed to emergency room if you experience new or worsening symptoms or symptoms do not resolve, if you have new neurologic symptoms or if headache is severe, or for any concerning symptom.   Provided education and  documentation from American headache Society toolbox including articles on: chronic migraine medication overuse headache, chronic migraines, prevention of migraines, behavioral and other nonpharmacologic treatments for headache.   Cc: Rowland Lathe, MD  A total of 25 minutes was spent face-to-face with this patient. Over half this time was spent on counseling patient on the lumbar radiculopathy diagnosis and different diagnostic and therapeutic options, counseling and coordination of care, risks ans benefits of management, compliance, or risk factor reduction and education.  This does not include time spent on botox injections today.    Sarina Ill, MD  [facility] [address] [place], [place] [address]  Phone [phone] Fax [phone]

## 2017-09-27 NOTE — Progress Notes (Signed)
Botox- 100 units x 2 vials Lot: [id number] Expiration: 01/22 NDC: 5449-2010-07  Bacteriostatic 0.9% Sodium Chloride- 83mL total Lot: [id number] Expiration: 07/15/2018 NDC: 8832-5498-26  Dx: E15.830 S/P

## 2017-09-28 ENCOUNTER — Telehealth: Payer: Self-pay | Admitting: Neurology

## 2017-09-28 NOTE — Telephone Encounter (Signed)
lvm for pt to call back about scheduling  [employer] Auth: [id number] (exp. 09/28/17 to 10/27/17)

## 2017-09-30 NOTE — Telephone Encounter (Signed)
I called to schedule the patient but he did not answer so I left a VM asking him to call me back.

## 2017-10-05 NOTE — Telephone Encounter (Signed)
Pt returning Danielle's call  °

## 2017-10-06 NOTE — Telephone Encounter (Signed)
I called the patient back and scheduled his apt.

## 2017-11-03 DIAGNOSIS — G43009 Migraine without aura, not intractable, without status migrainosus: Secondary | ICD-10-CM | POA: Diagnosis not present

## 2017-11-03 DIAGNOSIS — Z6835 Body mass index (BMI) 35.0-35.9, adult: Secondary | ICD-10-CM | POA: Diagnosis not present

## 2017-11-03 DIAGNOSIS — E782 Mixed hyperlipidemia: Secondary | ICD-10-CM | POA: Diagnosis not present

## 2017-11-03 DIAGNOSIS — Z0000 Encounter for general adult medical examination without abnormal findings: Secondary | ICD-10-CM | POA: Diagnosis not present

## 2017-11-03 DIAGNOSIS — R7301 Impaired fasting glucose: Secondary | ICD-10-CM | POA: Diagnosis not present

## 2017-11-03 DIAGNOSIS — M5410 Radiculopathy, site unspecified: Secondary | ICD-10-CM | POA: Diagnosis not present

## 2017-11-04 DIAGNOSIS — M7662 Achilles tendinitis, left leg: Secondary | ICD-10-CM | POA: Diagnosis not present

## 2017-11-17 DIAGNOSIS — R7301 Impaired fasting glucose: Secondary | ICD-10-CM | POA: Diagnosis not present

## 2017-11-17 DIAGNOSIS — E782 Mixed hyperlipidemia: Secondary | ICD-10-CM | POA: Diagnosis not present

## 2017-11-17 DIAGNOSIS — Z0001 Encounter for general adult medical examination with abnormal findings: Secondary | ICD-10-CM | POA: Diagnosis not present

## 2017-11-17 DIAGNOSIS — G43109 Migraine with aura, not intractable, without status migrainosus: Secondary | ICD-10-CM | POA: Diagnosis not present

## 2017-11-18 ENCOUNTER — Other Ambulatory Visit (HOSPITAL_COMMUNITY): Payer: Self-pay | Admitting: Internal Medicine

## 2017-11-18 DIAGNOSIS — M5416 Radiculopathy, lumbar region: Secondary | ICD-10-CM

## 2017-11-18 DIAGNOSIS — R339 Retention of urine, unspecified: Secondary | ICD-10-CM

## 2017-11-18 DIAGNOSIS — R200 Anesthesia of skin: Secondary | ICD-10-CM

## 2017-11-25 ENCOUNTER — Ambulatory Visit (HOSPITAL_COMMUNITY)
Admission: RE | Admit: 2017-11-25 | Discharge: 2017-11-25 | Disposition: A | Discharge status: Home / Self Care | Payer: BLUE CROSS/BLUE SHIELD | Source: Ambulatory Visit | Attending: Internal Medicine | Admitting: Internal Medicine

## 2017-11-25 DIAGNOSIS — R200 Anesthesia of skin: Secondary | ICD-10-CM | POA: Insufficient documentation

## 2017-11-25 DIAGNOSIS — M5416 Radiculopathy, lumbar region: Secondary | ICD-10-CM | POA: Diagnosis not present

## 2017-11-25 DIAGNOSIS — R339 Retention of urine, unspecified: Secondary | ICD-10-CM

## 2017-11-25 DIAGNOSIS — M48061 Spinal stenosis, lumbar region without neurogenic claudication: Secondary | ICD-10-CM | POA: Insufficient documentation

## 2017-11-25 DIAGNOSIS — M4726 Other spondylosis with radiculopathy, lumbar region: Secondary | ICD-10-CM | POA: Diagnosis not present

## 2017-11-25 MED ORDER — GADOBUTROL 1 MMOL/ML IV SOLN
10.0000 mL | Freq: Once | INTRAVENOUS | Status: AC | PRN
  Administered 2017-11-25: 10 mL via INTRAVENOUS

## 2017-12-21 ENCOUNTER — Telehealth: Payer: Self-pay | Admitting: Neurology

## 2017-12-21 NOTE — Telephone Encounter (Signed)
[facility] is calling and they need a PA before they ship Botox please call and obtain [facility] . [phone] . Patient 's apt 12/29/2017.

## 2017-12-21 NOTE — Telephone Encounter (Signed)
I called a refill request into [facility] [phone].

## 2017-12-23 NOTE — Telephone Encounter (Signed)
Authorization has been submitted. DW

## 2017-12-23 NOTE — Telephone Encounter (Signed)
Noted, will obtain PA today.

## 2017-12-27 NOTE — Telephone Encounter (Signed)
Botox PA [id number] (12/23/18). DW

## 2017-12-28 DIAGNOSIS — G43709 Chronic migraine without aura, not intractable, without status migrainosus: Secondary | ICD-10-CM | POA: Diagnosis not present

## 2017-12-29 ENCOUNTER — Ambulatory Visit (INDEPENDENT_AMBULATORY_CARE_PROVIDER_SITE_OTHER): Payer: Self-pay

## 2017-12-29 ENCOUNTER — Ambulatory Visit (INDEPENDENT_AMBULATORY_CARE_PROVIDER_SITE_OTHER): Payer: BLUE CROSS/BLUE SHIELD | Admitting: Neurology

## 2017-12-29 DIAGNOSIS — G43709 Chronic migraine without aura, not intractable, without status migrainosus: Secondary | ICD-10-CM

## 2017-12-29 DIAGNOSIS — Z1211 Encounter for screening for malignant neoplasm of colon: Secondary | ICD-10-CM

## 2017-12-29 MED ORDER — PEG 3350-KCL-NA BICARB-NACL 420 G PO SOLR: 4000.0000 mL | ORAL | 0 refills | Status: DC

## 2017-12-29 NOTE — Progress Notes (Signed)
Botox- 100 units x 2 vials Lot: [id number] Expiration: 06/2020 NDC: 0931-1216-24  Bacteriostatic 0.9% Sodium Chloride- 30mL total Lot: [id number] Expiration: 12/15/2018 NDC: 2257-5051-83  Dx: F58.251 S/P

## 2017-12-29 NOTE — Progress Notes (Signed)
+temples. +levator scapulae.   Second injection, >50% reduction in migraine frequency, excellent response, patient endorses being pleased with the results.   Consent Form Botulism Toxin Injection For Chronic Migraine    Reviewed orally with patient, additionally signature is on file:  Botulism toxin has been approved by the Federal drug administration for treatment of chronic migraine. Botulism toxin does not cure chronic migraine and it may not be effective in some patients.  The administration of botulism toxin is accomplished by injecting a small amount of toxin into the muscles of the neck and head. Dosage must be titrated for each individual. Any benefits resulting from botulism toxin tend to wear off after 3 months with a repeat injection required if benefit is to be maintained. Injections are usually done every 3-4 months with maximum effect peak achieved by about 2 or 3 weeks. Botulism toxin is expensive and you should be sure of what costs you will incur resulting from the injection.  The side effects of botulism toxin use for chronic migraine may include:   -Transient, and usually mild, facial weakness with facial injections  -Transient, and usually mild, head or neck weakness with head/neck injections  -Reduction or loss of forehead facial animation due to forehead muscle weakness  -Eyelid drooping  -Dry eye  -Pain at the site of injection or bruising at the site of injection  -Double vision  -Potential unknown long term risks  Contraindications: You should not have Botox if you are pregnant, nursing, allergic to albumin, have an infection, skin condition, or muscle weakness at the site of the injection, or have myasthenia gravis, Lambert-Eaton syndrome, or ALS.  It is also possible that as with any injection, there may be an allergic reaction or no effect from the medication. Reduced effectiveness after repeated injections is sometimes seen and rarely infection at the  injection site may occur. All care will be taken to prevent these side effects. If therapy is given over a long time, atrophy and wasting in the muscle injected may occur. Occasionally the patient's become refractory to treatment because they develop antibodies to the toxin. In this event, therapy needs to be modified.  I have read the above information and consent to the administration of botulism toxin.    BOTOX PROCEDURE NOTE FOR MIGRAINE HEADACHE    Contraindications and precautions discussed with patient(above). Aseptic procedure was observed and patient tolerated procedure. Procedure performed by Dr. Georgia Dom  The condition has existed for more than 6 months, and pt does not have a diagnosis of ALS, Myasthenia Gravis or Lambert-Eaton Syndrome.  Risks and benefits of injections discussed and pt agrees to proceed with the procedure.  Written consent obtained  These injections are medically necessary. Pt  receives good benefits from these injections. These injections do not cause sedations or hallucinations which the oral therapies may cause.  Indication/Diagnosis: chronic migraine BOTOX(J0585) injection was performed according to protocol by [employer]. 200 units of BOTOX was dissolved into 4 cc NS.   NDC: [address]-33   Description of procedure:  The patient was placed in a sitting position. The standard protocol was used for Botox as follows, with 5 units of Botox injected at each site:   -Procerus muscle, midline injection  -Corrugator muscle, bilateral injection  -Frontalis muscle, bilateral injection, with 2 sites each side, medial injection was performed in the upper one third of the frontalis muscle, in the region vertical from the medial inferior edge of the superior orbital rim. The lateral injection was  again in the upper one third of the forehead vertically above the lateral limbus of the cornea, 1.5 cm lateral to the medial injection site.  -Temporalis muscle  injection, 5 sites, bilaterally. The first injection was 3 cm above the tragus of the ear, second injection site was 1.5 cm to 3 cm up from the first injection site in line with the tragus of the ear. The third injection site was 1.5-3 cm forward between the first 2 injection sites. The fourth injection site was 1.5 cm posterior to the second injection site. 5th site laterally in the temporalis at the level of the outer canthus.  -Occipitalis muscle injection, 3 sites, bilaterally. The first injection was done one half way between the occipital protuberance and the tip of the mastoid process behind the ear. The second injection site was done lateral and superior to the first, 1 fingerbreadth from the first injection. The third injection site was 1 fingerbreadth superiorly and medially from the first injection site.  -Cervical paraspinal muscle injection, 2 sites, bilateral knee first injection site was 1 cm from the midline of the cervical spine, 3 cm inferior to the lower border of the occipital protuberance. The second injection site was 1.5 cm superiorly and laterally to the first injection site.  - Levator Scapulae: 5 units bilaterally  -Trapezius muscle injection was performed at 3 sites, bilaterally. The first injection site was in the upper trapezius muscle halfway between the inflection point of the neck, and the acromion. The second injection site was one half way between the acromion and the first injection site. The third injection was done between the first injection site and the inflection point of the neck.   Will return for repeat injection in 3 months.   A 200 unit sof Botox was used, 175 units were injected, the rest of the Botox was wasted. The patient tolerated the procedure well, there were no complications of the above procedure.

## 2017-12-29 NOTE — Progress Notes (Signed)
Gastroenterology Pre-Procedure Review  Request Date:12/29/17 Requesting Physician: Inetta Fermo- no previous tcs  PATIENT REVIEW QUESTIONS: The patient responded to the following health history questions as indicated:    1. Diabetes Melitis: no 2. Joint replacements in the past 12 months: no 3. Major health problems in the past 3 months: no 4. Has an artificial valve or MVP: no 5. Has a defibrillator: no 6. Has been advised in past to take antibiotics in advance of a procedure like teeth cleaning: no 7. Family history of colon cancer: no  8. Alcohol Use: yes (occasionally) 9. History of sleep apnea: no  10. History of coronary artery or other vascular stents placed within the last 12 months: no 11. History of any prior anesthesia complications: no    MEDICATIONS & ALLERGIES:    Patient reports the following regarding taking any blood thinners:   Plavix? no Aspirin? no Coumadin? no Brilinta? no Xarelto? no Eliquis? no Pradaxa? no Savaysa? no Effient? no  Patient confirms/reports the following medications:  Current Outpatient Medications  Medication Sig Dispense Refill  . Cholecalciferol (VITAMIN D3) 1000 units CAPS Take by mouth.    . Coenzyme Q10 (CO Q 10 PO) Take 500 mg by mouth daily.    Marland Kitchen gabapentin (NEURONTIN) 100 MG capsule Take 1 capsule (100 mg total) by mouth 2 (two) times daily. 180 capsule 3  . MAGNESIUM PO Take 1,000 mg by mouth.     . naratriptan (AMERGE) 2.5 MG tablet Take one (1) tablet at onset of headache; if returns or does not resolve, may repeat after 2 hours; do not exceed five (5) mg in 24 hours. 10 tablet 11  . OnabotulinumtoxinA (BOTOX IJ) Inject as directed.    . vitamin B-12 (CYANOCOBALAMIN) 500 MCG tablet Take 1,000 mcg by mouth daily.     No current facility-administered medications for this visit.     Patient confirms/reports the following allergies:  Allergies  Allergen Reactions  . Pseudoephedrine Palpitations  . Sulfa Antibiotics Rash   . Wellbutrin [Bupropion] Other (See Comments)    Insomnia, anxiety, nausea, paranoia    No orders of the defined types were placed in this encounter.   AUTHORIZATION INFORMATION Primary Insurance: [place],  ID #: [id number] Pre-Cert / Auth required: no   SCHEDULE INFORMATION: Procedure has been scheduled as follows:  Date: 03/07/18, Time: 10:30  Location: [facility]  This Gastroenterology Pre-Precedure Review Form is being routed to the following provider(s): Neil Crouch, PA

## 2017-12-29 NOTE — Patient Instructions (Signed)
Carlos Ford   07/05/1965 MRN: [id number]    Procedure Date: 03/07/18 Time to register: 9:30am Place to register: [facility] Procedure Time: 10:30am Scheduled provider: Barney Drain, MD  PREPARATION FOR COLONOSCOPY WITH TRI-LYTE SPLIT PREP  Please notify us immediately if you are diabetic, take iron supplements, or if you are on Coumadin or any other blood thinners.   You will need to purchase 1 fleet enema and 1 box of Bisacodyl 73m tablets.  1 DAY BEFORE PROCEDURE:  DATE: 03/06/18  DAY: Sunday Continue clear liquids the entire day - NO SOLID FOOD.   At 2:00 pm:  Take 2 Bisacodyl tablets.   At 4:00pm:  Start drinking your solution. Make sure you mix well per instructions on the bottle. Try to drink 1 (one) 8 ounce glass every 10-15 minutes until you have consumed HALF the jug. You should complete by 6:00pm.You must keep the left over solution refrigerated until completed next day.  Continue clear liquids. You must drink plenty of clear liquids to prevent dehyration and kidney failure.     DAY OF PROCEDURE:   DATE: 03/07/18   DAY: Monday If you take medications for your heart, blood pressure or breathing, you may take these medications.   Five hours before your procedure time @ 5:30am:  Finish remaining amout of bowel prep, drinking 1 (one) 8 ounce glass every 10-15 minutes until complete. You have two hours to consume remaining prep.   Three hours before your procedure time @ 7:30am:  Nothing by mouth.   At least one hour before going to the hospital:  Give yourself one Fleet enema.  You may take your morning medications with sip of water unless we have instructed otherwise.      Please see below for Dietary Information.  CLEAR LIQUIDS INCLUDE:  Water Jello (NOT red in color)   Ice Popsicles (NOT red in color)   Tea (sugar ok, no milk/cream) Powdered fruit flavored drinks  Coffee (sugar ok, no milk/cream) Gatorade/ Lemonade/ Kool-Aid  (NOT red in color)    Juice: apple, white grape, white cranberry Soft drinks  Clear bullion, consomme, broth (fat free beef/chicken/vegetable)  Carbonated beverages (any kind)  Strained chicken noodle soup Hard Candy   Remember: Clear liquids are liquids that will allow you to see your fingers on the other side of a clear glass. Be sure liquids are NOT red in color, and not cloudy, but CLEAR.  DO NOT EAT OR DRINK ANY OF THE FOLLOWING:  Dairy products of any kind   Cranberry juice Tomato juice / V8 juice   Grapefruit juice Orange juice     Red grape juice  Do not eat any solid foods, including such foods as: cereal, oatmeal, yogurt, fruits, vegetables, creamed soups, eggs, bread, crackers, pureed foods in a blender, etc.   HELPFUL HINTS FOR DRINKING PREP SOLUTION:   Make sure prep is extremely cold. Mix and refrigerate the the morning of the prep. You may also put in the freezer.   You may try mixing some Crystal Light or Country Time Lemonade if you prefer. Mix in small amounts; add more if necessary.  Try drinking through a straw  Rinse mouth with water or a mouthwash between glasses, to remove after-taste.  Try sipping on a cold beverage /ice/ popsicles between glasses of prep.  Place a piece of sugar-free hard candy in mouth between glasses.  If you become nauseated, try consuming smaller amounts, or stretch out the time between glasses. Stop for  30-60 minutes, then slowly start back drinking.        OTHER INSTRUCTIONS  You will need a responsible adult at least 51 years of age to accompany you and drive you home. This person must remain in the waiting room during your procedure. The hospital will cancel your procedure if you do not have a responsible adult with you.   1. Wear loose fitting clothing that is easily removed. 2. Leave jewelry and other valuables at home.  3. Remove all body piercing jewelry and leave at home. 4. Total time from sign-in until discharge is approximately 2-3  hours. 5. You should go home directly after your procedure and rest. You can resume normal activities the day after your procedure. 6. The day of your procedure you should not:  Drive  Make legal decisions  Operate machinery  Drink alcohol  Return to work   You may call the office (Dept: [phone]) before 5:00pm, or page the doctor on call [phone]) after 5:00pm, for further instructions, if necessary.   Insurance Information YOU WILL NEED TO CHECK WITH YOUR INSURANCE COMPANY FOR THE BENEFITS OF COVERAGE YOU HAVE FOR THIS PROCEDURE.  UNFORTUNATELY, NOT ALL INSURANCE COMPANIES HAVE BENEFITS TO COVER ALL OR PART OF THESE TYPES OF PROCEDURES.  IT IS YOUR RESPONSIBILITY TO CHECK YOUR BENEFITS, HOWEVER, WE WILL BE GLAD TO ASSIST YOU WITH ANY CODES YOUR INSURANCE COMPANY MAY NEED.    PLEASE NOTE THAT MOST INSURANCE COMPANIES WILL NOT COVER A SCREENING COLONOSCOPY FOR PEOPLE UNDER THE AGE OF 51  IF YOU HAVE BCBS INSURANCE, YOU MAY HAVE BENEFITS FOR A SCREENING COLONOSCOPY BUT IF POLYPS ARE FOUND THE DIAGNOSIS WILL CHANGE AND THEN YOU MAY HAVE A DEDUCTIBLE THAT WILL NEED TO BE MET. SO PLEASE MAKE SURE YOU CHECK YOUR BENEFITS FOR A SCREENING COLONOSCOPY AS WELL AS A DIAGNOSTIC COLONOSCOPY.

## 2018-01-06 NOTE — Progress Notes (Signed)
Ok to schedule.

## 2018-03-07 ENCOUNTER — Ambulatory Visit (HOSPITAL_COMMUNITY)
Admission: RE | Admit: 2018-03-07 | Discharge: 2018-03-07 | Disposition: A | Discharge status: Home / Self Care | Payer: BLUE CROSS/BLUE SHIELD | Attending: Gastroenterology | Admitting: Gastroenterology

## 2018-03-07 ENCOUNTER — Encounter (HOSPITAL_COMMUNITY)
Admission: RE | Disposition: A | Discharge status: Home / Self Care | Payer: Self-pay | Source: Home / Self Care | Attending: Gastroenterology

## 2018-03-07 ENCOUNTER — Encounter (HOSPITAL_COMMUNITY): Payer: Self-pay | Admitting: *Deleted

## 2018-03-07 ENCOUNTER — Other Ambulatory Visit: Payer: Self-pay

## 2018-03-07 DIAGNOSIS — Z820 Family history of epilepsy and other diseases of the nervous system: Secondary | ICD-10-CM | POA: Diagnosis not present

## 2018-03-07 DIAGNOSIS — Z79899 Other long term (current) drug therapy: Secondary | ICD-10-CM | POA: Insufficient documentation

## 2018-03-07 DIAGNOSIS — K648 Other hemorrhoids: Secondary | ICD-10-CM | POA: Insufficient documentation

## 2018-03-07 DIAGNOSIS — G4700 Insomnia, unspecified: Secondary | ICD-10-CM | POA: Diagnosis not present

## 2018-03-07 DIAGNOSIS — Q438 Other specified congenital malformations of intestine: Secondary | ICD-10-CM

## 2018-03-07 DIAGNOSIS — E785 Hyperlipidemia, unspecified: Secondary | ICD-10-CM | POA: Diagnosis not present

## 2018-03-07 DIAGNOSIS — Z833 Family history of diabetes mellitus: Secondary | ICD-10-CM | POA: Diagnosis not present

## 2018-03-07 DIAGNOSIS — Z8249 Family history of ischemic heart disease and other diseases of the circulatory system: Secondary | ICD-10-CM | POA: Insufficient documentation

## 2018-03-07 DIAGNOSIS — D125 Benign neoplasm of sigmoid colon: Secondary | ICD-10-CM

## 2018-03-07 DIAGNOSIS — K589 Irritable bowel syndrome without diarrhea: Secondary | ICD-10-CM | POA: Insufficient documentation

## 2018-03-07 DIAGNOSIS — K219 Gastro-esophageal reflux disease without esophagitis: Secondary | ICD-10-CM | POA: Insufficient documentation

## 2018-03-07 DIAGNOSIS — Z5181 Encounter for therapeutic drug level monitoring: Secondary | ICD-10-CM

## 2018-03-07 DIAGNOSIS — Z1211 Encounter for screening for malignant neoplasm of colon: Secondary | ICD-10-CM | POA: Diagnosis not present

## 2018-03-07 HISTORY — PX: POLYPECTOMY: SHX5525

## 2018-03-07 HISTORY — PX: COLONOSCOPY: SHX5424

## 2018-03-07 SURGERY — COLONOSCOPY
Anesthesia: Moderate Sedation

## 2018-03-07 MED ORDER — MEPERIDINE HCL 100 MG/ML IJ SOLN
INTRAMUSCULAR | Status: DC | PRN
  Administered 2018-03-07 (×3): 25 mg

## 2018-03-07 MED ORDER — STERILE WATER FOR IRRIGATION IR SOLN
Status: DC | PRN
  Administered 2018-03-07: 1.5 mL

## 2018-03-07 MED ORDER — SODIUM CHLORIDE 0.9 % IV SOLN
INTRAVENOUS | Status: DC
  Administered 2018-03-07: 10:00:00 via INTRAVENOUS

## 2018-03-07 MED ORDER — MEPERIDINE HCL 100 MG/ML IJ SOLN
INTRAMUSCULAR | Status: AC
  Filled 2018-03-07: qty 2

## 2018-03-07 MED ORDER — MIDAZOLAM HCL 5 MG/5ML IJ SOLN
INTRAMUSCULAR | Status: DC | PRN
  Administered 2018-03-07: 1 mg via INTRAVENOUS
  Administered 2018-03-07 (×2): 2 mg via INTRAVENOUS

## 2018-03-07 MED ORDER — MIDAZOLAM HCL 5 MG/5ML IJ SOLN
INTRAMUSCULAR | Status: AC
  Filled 2018-03-07: qty 10

## 2018-03-07 NOTE — Op Note (Signed)
[facility] Patient Name: Carlos Ford Procedure Date: 03/07/2018 10:42 AM MRN: [id number] Date of Birth: 1966/01/19 Attending MD: Barney Drain MD, MD CSN: [id number] Age: 52 Admit Type: Outpatient Procedure:                Colonoscopy WITH COLD SNARE POLYPECTOMY Indications:              Screening for colorectal malignant neoplasm Providers:                Barney Drain MD, MD, Gerome Sam, RN, Tammy                            Vaught, RN, Nelma Rothman, Technician Referring MD:             Edwinna Areola. [place] Crane MD Medicines:                Meperidine 75 mg IV, Midazolam 5 mg IV Complications:            No immediate complications. Estimated Blood Loss:     Estimated blood loss was minimal. Procedure:                Pre-Anesthesia Assessment:                           - Prior to the procedure, a History and Physical                            was performed, and patient medications and                            allergies were reviewed. The patient's tolerance of                            previous anesthesia was also reviewed. The risks                            and benefits of the procedure and the sedation                            options and risks were discussed with the patient.                            All questions were answered, and informed consent                            was obtained. Prior Anticoagulants: The patient has                            taken no previous anticoagulant or antiplatelet                            agents. ASA Grade Assessment: II - A patient with                            mild systemic disease. After reviewing the risks  and benefits, the patient was deemed in                            satisfactory condition to undergo the procedure.                            After obtaining informed consent, the colonoscope                            was passed under direct vision. Throughout the   procedure, the patient's blood pressure, pulse, and                            oxygen saturations were monitored continuously. The                            CF-[id number] ([id number]) scope was introduced through                            the anus and advanced to the the cecum, identified                            by appendiceal orifice and ileocecal valve. The                            colonoscopy was somewhat difficult due to a                            tortuous colon. Successful completion of the                            procedure was aided by straightening and shortening                            the scope to obtain bowel loop reduction and                            COLOWRAP. The patient tolerated the procedure                            fairly well. The quality of the bowel preparation                            was good. The ileocecal valve, appendiceal orifice,                            and rectum were photographed. Scope In: 11:01:23 AM Scope Out: 11:20:37 AM Scope Withdrawal Time: 0 hours 16 minutes 49 seconds  Total Procedure Duration: 0 hours 19 minutes 14 seconds  Findings:      A 4 mm polyp was found in the sigmoid colon. The polyp was sessile. The       polyp was removed with a cold snare. Resection and retrieval were       complete.      Internal  hemorrhoids were found. The hemorrhoids were small.      The recto-sigmoid colon and sigmoid colon were mildly redundant. Impression:               - One 4 mm polyp in the sigmoid colon, removed with                            a cold snare. Resected and retrieved.                           - Internal hemorrhoids.                           - Redundant colon. Moderate Sedation:      Moderate (conscious) sedation was administered by the endoscopy nurse       and supervised by the endoscopist. The following parameters were       monitored: oxygen saturation, heart rate, blood pressure, and response       to care. Total physician  intraservice time was 34 minutes. Recommendation:           - Patient has a contact number available for                            emergencies. The signs and symptoms of potential                            delayed complications were discussed with the                            patient. Return to normal activities tomorrow.                            Written discharge instructions were provided to the                            patient.                           - High fiber diet.                           - Continue present medications.                           - Await pathology results.                           - Repeat colonoscopy in 5-10 years for surveillance. Procedure Code(s):        --- Professional ---                           [phone], Colonoscopy, flexible; with removal of                            tumor(s), polyp(s), or other lesion(s) by snare  technique                           [id number], Moderate sedation; each additional 15                            minutes intraservice time                           G0500, Moderate sedation services provided by the                            same physician or other qualified health care                            professional performing a gastrointestinal                            endoscopic service that sedation supports,                            requiring the presence of an independent trained                            observer to assist in the monitoring of the                            patient's level of consciousness and physiological                            status; initial 15 minutes of intra-service time;                            patient age 53 years or older (additional time may                            be reported with [phone], as appropriate) Diagnosis Code(s):        --- Professional ---                           Z12.11, Encounter for screening for malignant                             neoplasm of colon                           D12.5, Benign neoplasm of sigmoid colon                           K64.8, Other hemorrhoids                           Q43.8, Other specified congenital malformations of                            intestine CPT copyright 2018 American Medical Association.  All rights reserved. The codes documented in this report are preliminary and upon coder review may  be revised to meet current compliance requirements. Barney Drain, MD Barney Drain MD, MD 03/07/2018 11:44:44 AM This report has been signed electronically. Number of Addenda: 0

## 2018-03-07 NOTE — H&P (Signed)
Primary Care Physician:  Celene Squibb, MD Primary Gastroenterologist:  Dr. Oneida Alar  Pre-Procedure History & Physical: HPI:  Carlos Ford is a 52 y.o. male here for COLON CANCER SCREENING.  Past Medical History:  Diagnosis Date  . GERD (gastroesophageal reflux disease)   . Hyperlipidemia   . IBS (irritable bowel syndrome)   . Insomnia   . Intervertebral cervical disc disorder with myelopathy, cervical region   . Migraines     Past Surgical History:  Procedure Laterality Date  . BACK SURGERY    . ESOPHAGOGASTRODUODENOSCOPY  2005  . LUMBAR EPIDURAL INJECTION  2014   L4-5  . LUMBAR LAMINECTOMY  2014  . MOUTH SURGERY  2003    Prior to Admission medications   Medication Sig Start Date End Date Taking? Authorizing Provider  Cholecalciferol (VITAMIN D3 PO) Take 1 capsule by mouth daily.    Yes [provider]  gabapentin (NEURONTIN) 100 MG capsule Take 1 capsule (100 mg total) by mouth 2 (two) times daily. Patient taking differently: Take 100 mg by mouth daily.  05/07/17  Yes Melvenia Beam, MD  Magnesium 500 MG TABS Take 500 mg by mouth daily.   Yes [provider]  naratriptan (AMERGE) 2.5 MG tablet Take one (1) tablet at onset of headache; if returns or does not resolve, may repeat after 2 hours; do not exceed five (5) mg in 24 hours. Patient taking differently: Take 2.5 mg by mouth as needed for migraine. If returns or does not resolve, may repeat after 2 hours; do not exceed five (5) mg in 24 hours. 05/07/17  Yes Melvenia Beam, MD  polyethylene glycol-electrolytes (TRILYTE) 420 g solution Take 4,000 mLs by mouth as directed. 12/29/17  Yes Mahala Menghini, PA-C    Allergies as of 12/29/2017 - Review Complete 12/29/2017  Allergen Reaction Noted  . Pseudoephedrine Palpitations 05/06/2017  . Sulfa antibiotics Rash 05/06/2017  . Wellbutrin [bupropion] Other (See Comments) 05/06/2017    Family History  Problem Relation Age of Onset  . Migraines  Mother   . Diabetes Father   . CAD Father   . Migraines Sister   . Migraines Sister   . Colon cancer Neg Hx   . Colon polyps Neg Hx     Social History   Socioeconomic History  . Marital status: Married    Spouse name: Not on file  . Number of children: Not on file  . Years of education: Not on file  . Highest education level: Not on file  Occupational History  . Not on file  Social Needs  . Financial resource strain: Not on file  . Food insecurity:    Worry: Not on file    Inability: Not on file  . Transportation needs:    Medical: Not on file    Non-medical: Not on file  Tobacco Use  . Smoking status: Never Smoker  . Smokeless tobacco: Former Network engineer and Sexual Activity  . Alcohol use: Yes    Comment: occasional  . Drug use: No  . Sexual activity: Not on file  Lifestyle  . Physical activity:    Days per week: Not on file    Minutes per session: Not on file  . Stress: Not on file  Relationships  . Social connections:    Talks on phone: Not on file    Gets together: Not on file    Attends religious service: Not on file    Active member of club or  organization: Not on file    Attends meetings of clubs or organizations: Not on file    Relationship status: Not on file  . Intimate partner violence:    Fear of current or ex partner: Not on file    Emotionally abused: Not on file    Physically abused: Not on file    Forced sexual activity: Not on file  Other Topics Concern  . Not on file  Social History Narrative   MARRIED. WORKS AT [employer]. HOBBIES; [place].    Review of Systems: See HPI, otherwise negative ROS   Physical Exam: BP (!) 125/91   Pulse 78   Temp 98 F (36.7 C) (Oral)   Resp 14   Ht 5\' 8"  (1.727 m)   Wt 104.3 kg   SpO2 94%   BMI 34.97 kg/m  General:   Alert,  pleasant and cooperative in NAD Head:  Normocephalic and atraumatic. Neck:  Supple; Lungs:  Clear throughout to auscultation.    Heart:  Regular rate and  rhythm. Abdomen:  Soft, nontender and nondistended. Normal bowel sounds, without guarding, and without rebound.   Neurologic:  Alert and  oriented x4;  grossly normal neurologically.  Impression/Plan:    SCREENING  Plan:  1. TCS TODAY DISCUSSED PROCEDURE, BENEFITS, & RISKS: < 1% chance of medication reaction, bleeding, perforation, or rupture of spleen/liver.

## 2018-03-07 NOTE — Discharge Instructions (Signed)
You had 1 small polyp removed. You have SMALL internal hemorrhoids.   DRINK WATER TO KEEP YOUR URINE LIGHT YELLOW.  CONTINUE YOUR WEIGHT LOSS EFFORTS. YOUR BODY MASS INDEX IS OVER 30 WHICH MEANS YOU ARE OBESE. OBESITY IS ASSOCIATED WITH AN INCREASED FOR CIRRHOSIS AND ALL CANCERS, INCLUDING ESOPHAGEAL AND COLON CANCER. A WEIGHT OF 195 LBS OR LESS  WILL GET YOUR BODY MASS INDEX(BMI) UNDER 30.   FOLLOW A HIGH FIBER DIET. AVOID ITEMS THAT CAUSE BLOATING & GAS. SEE INFO BELOW.  YOUR BIOPSY RESULTS WILL BE BACK IN 5 BUSINESS DAYS.  Next colonoscopy in between 2024 and 2026.    Colonoscopy Care After Read the instructions outlined below and refer to this sheet in the next week. These discharge instructions provide you with general information on caring for yourself after you leave the hospital. While your treatment has been planned according to the most current medical practices available, unavoidable complications occasionally occur. If you have any problems or questions after discharge, call DR. Sherlyne Crownover, [phone].  ACTIVITY  You may resume your regular activity, but move at a slower pace for the next 24 hours.   Take frequent rest periods for the next 24 hours.   Walking will help get rid of the air and reduce the bloated feeling in your belly (abdomen).   No driving for 24 hours (because of the medicine (anesthesia) used during the test).   You may shower.   Do not sign any important legal documents or operate any machinery for 24 hours (because of the anesthesia used during the test).    NUTRITION  Drink plenty of fluids.   You may resume your normal diet as instructed by your doctor.   Begin with a light meal and progress to your normal diet. Heavy or fried foods are harder to digest and may make you feel sick to your stomach (nauseated).   Avoid alcoholic beverages for 24 hours or as instructed.    MEDICATIONS  You may resume your normal medications.   WHAT YOU CAN  EXPECT TODAY  Some feelings of bloating in the abdomen.   Passage of more gas than usual.   Spotting of blood in your stool or on the toilet paper  .  IF YOU HAD POLYPS REMOVED DURING THE COLONOSCOPY:  Eat a soft diet IF YOU HAVE NAUSEA, BLOATING, ABDOMINAL PAIN, OR VOMITING.    FINDING OUT THE RESULTS OF YOUR TEST Not all test results are available during your visit. DR. Oneida Alar WILL CALL YOU WITHIN 14 DAYS OF YOUR PROCEDUE WITH YOUR RESULTS. Do not assume everything is normal if you have not heard from DR. Yuri Flener, CALL HER OFFICE AT [phone].  SEEK IMMEDIATE MEDICAL ATTENTION AND CALL THE OFFICE: [phone] IF:  You have more than a spotting of blood in your stool.   Your belly is swollen (abdominal distention).   You are nauseated or vomiting.   You have a temperature over 101F.   You have abdominal pain or discomfort that is severe or gets worse throughout the day.   High-Fiber Diet A high-fiber diet changes your normal diet to include more whole grains, legumes, fruits, and vegetables. Changes in the diet involve replacing refined carbohydrates with unrefined foods. The calorie level of the diet is essentially unchanged. The Dietary Reference Intake (recommended amount) for adult males is 38 grams per day. For adult females, it is 25 grams per day. Pregnant and lactating women should consume 28 grams of fiber per day. Fiber is the  intact part of a plant that is not broken down during digestion. Functional fiber is fiber that has been isolated from the plant to provide a beneficial effect in the body. PURPOSE  Increase stool bulk.   Ease and regulate bowel movements.   Lower cholesterol.   REDUCE RISK OF COLON CANCER  INDICATIONS THAT YOU NEED MORE FIBER  Constipation and hemorrhoids.   Uncomplicated diverticulosis (intestine condition) and irritable bowel syndrome.   Weight management.   As a protective measure against hardening of the arteries  (atherosclerosis), diabetes, and cancer.   GUIDELINES FOR INCREASING FIBER IN THE DIET  Start adding fiber to the diet slowly. A gradual increase of about 5 more grams (2 slices of whole-wheat bread, 2 servings of most fruits or vegetables, or 1 bowl of high-fiber cereal) per day is best. Too rapid an increase in fiber may result in constipation, flatulence, and bloating.   Drink enough water and fluids to keep your urine clear or pale yellow. Water, juice, or caffeine-free drinks are recommended. Not drinking enough fluid may cause constipation.   Eat a variety of high-fiber foods rather than one type of fiber.   Try to increase your intake of fiber through using high-fiber foods rather than fiber pills or supplements that contain small amounts of fiber.   The goal is to change the types of food eaten. Do not supplement your present diet with high-fiber foods, but replace foods in your present diet.   INCLUDE A VARIETY OF FIBER SOURCES  Replace refined and processed grains with whole grains, canned fruits with fresh fruits, and incorporate other fiber sources. White rice, white breads, and most bakery goods contain little or no fiber.   Brown whole-grain rice, buckwheat oats, and many fruits and vegetables are all good sources of fiber. These include: broccoli, Brussels sprouts, cabbage, cauliflower, beets, sweet potatoes, white potatoes (skin on), carrots, tomatoes, eggplant, squash, berries, fresh fruits, and dried fruits.   Cereals appear to be the richest source of fiber. Cereal fiber is found in whole grains and bran. Bran is the fiber-rich outer coat of cereal grain, which is largely removed in refining. In whole-grain cereals, the bran remains. In breakfast cereals, the largest amount of fiber is found in those with "bran" in their names. The fiber content is sometimes indicated on the label.   You may need to include additional fruits and vegetables each day.   In baking, for 1 cup  white flour, you may use the following substitutions:   1 cup whole-wheat flour minus 2 tablespoons.   1/2 cup white flour plus 1/2 cup whole-wheat flour.   Polyps, Colon  A polyp is extra tissue that grows inside your body. Colon polyps grow in the large intestine. The large intestine, also called the colon, is part of your digestive system. It is a long, hollow tube at the end of your digestive tract where your body makes and stores stool. Most polyps are not dangerous. They are benign. This means they are not cancerous. But over time, some types of polyps can turn into cancer. Polyps that are smaller than a pea are usually not harmful. But larger polyps could someday become or may already be cancerous. To be safe, doctors remove all polyps and test them.   WHO GETS POLYPS? Anyone can get polyps, but certain people are more likely than others. You may have a greater chance of getting polyps if:  You are over 50.   You have had polyps before.  Someone in your family has had polyps.   Someone in your family has had cancer of the large intestine.   Find out if someone in your family has had polyps. You may also be more likely to get polyps if you:   Eat a lot of fatty foods   Smoke   Drink alcohol   Do not exercise  Eat too much   PREVENTION There is not one sure way to prevent polyps. You might be able to lower your risk of getting them if you:  Eat more fruits and vegetables and less fatty food.   Do not smoke.   Avoid alcohol.   Exercise every day.   Lose weight if you are overweight.   Eating more calcium and folate can also lower your risk of getting polyps. Some foods that are rich in calcium are milk, cheese, and broccoli. Some foods that are rich in folate are chickpeas, kidney beans, and spinach.

## 2018-03-10 ENCOUNTER — Telehealth: Payer: Self-pay | Admitting: Gastroenterology

## 2018-03-10 NOTE — Telephone Encounter (Signed)
Please call pt. He had A simple adenoma removed.  DRINK WATER TO KEEP YOUR URINE LIGHT YELLOW.   CONTINUE YOUR WEIGHT LOSS EFFORTS.  A WEIGHT OF 195 LBS OR LESS  WILL GET YOUR BODY MASS INDEX(BMI) UNDER 30.  FOLLOW A HIGH FIBER DIET. AVOID ITEMS THAT CAUSE BLOATING & GAS.   Next colonoscopy in between 2024 and 2026.

## 2018-03-11 NOTE — Telephone Encounter (Signed)
Pt notified of results

## 2018-03-11 NOTE — Telephone Encounter (Signed)
Lmom, waiting on a return call.  

## 2018-03-14 ENCOUNTER — Encounter (HOSPITAL_COMMUNITY): Payer: Self-pay | Admitting: Gastroenterology

## 2018-03-14 NOTE — Telephone Encounter (Signed)
ON RECALL  °

## 2018-03-28 ENCOUNTER — Telehealth: Payer: Self-pay | Admitting: Neurology

## 2018-03-28 NOTE — Telephone Encounter (Signed)
I called [facility] [phone] to request a refill on the patients Botox.  Botox PA [id number] (12/23/18).

## 2018-03-29 NOTE — Telephone Encounter (Signed)
I called [facility] to check status of the botox medication. It is still pending insurance verification.

## 2018-03-30 NOTE — Telephone Encounter (Signed)
I called [facility] to check status, it is still pending. DW

## 2018-03-31 ENCOUNTER — Ambulatory Visit: Payer: BLUE CROSS/BLUE SHIELD | Admitting: Neurology

## 2018-03-31 DIAGNOSIS — G43709 Chronic migraine without aura, not intractable, without status migrainosus: Secondary | ICD-10-CM | POA: Diagnosis not present

## 2018-03-31 MED ORDER — ZOLPIDEM TARTRATE 10 MG PO TABS: 10.0000 mg | ORAL_TABLET | Freq: Every evening | ORAL | 0 refills | Status: DC | PRN

## 2018-03-31 NOTE — Progress Notes (Signed)
+temples. +levator scapulae. +occipitalis  Third injection, >60% reduction in migraine frequency, excellent response, patient endorses being pleased with the results.   Consent Form Botulism Toxin Injection For Chronic Migraine    Reviewed orally with patient, additionally signature is on file:  Botulism toxin has been approved by the Federal drug administration for treatment of chronic migraine. Botulism toxin does not cure chronic migraine and it may not be effective in some patients.  The administration of botulism toxin is accomplished by injecting a small amount of toxin into the muscles of the neck and head. Dosage must be titrated for each individual. Any benefits resulting from botulism toxin tend to wear off after 3 months with a repeat injection required if benefit is to be maintained. Injections are usually done every 3-4 months with maximum effect peak achieved by about 2 or 3 weeks. Botulism toxin is expensive and you should be sure of what costs you will incur resulting from the injection.  The side effects of botulism toxin use for chronic migraine may include:   -Transient, and usually mild, facial weakness with facial injections  -Transient, and usually mild, head or neck weakness with head/neck injections  -Reduction or loss of forehead facial animation due to forehead muscle weakness  -Eyelid drooping  -Dry eye  -Pain at the site of injection or bruising at the site of injection  -Double vision  -Potential unknown long term risks  Contraindications: You should not have Botox if you are pregnant, nursing, allergic to albumin, have an infection, skin condition, or muscle weakness at the site of the injection, or have myasthenia gravis, Lambert-Eaton syndrome, or ALS.  It is also possible that as with any injection, there may be an allergic reaction or no effect from the medication. Reduced effectiveness after repeated injections is sometimes seen and rarely infection  at the injection site may occur. All care will be taken to prevent these side effects. If therapy is given over a long time, atrophy and wasting in the muscle injected may occur. Occasionally the patient's become refractory to treatment because they develop antibodies to the toxin. In this event, therapy needs to be modified.  I have read the above information and consent to the administration of botulism toxin.    BOTOX PROCEDURE NOTE FOR MIGRAINE HEADACHE    Contraindications and precautions discussed with patient(above). Aseptic procedure was observed and patient tolerated procedure. Procedure performed by Dr. Georgia Dom  The condition has existed for more than 6 months, and pt does not have a diagnosis of ALS, Myasthenia Gravis or Lambert-Eaton Syndrome.  Risks and benefits of injections discussed and pt agrees to proceed with the procedure.  Written consent obtained  These injections are medically necessary. Pt  receives good benefits from these injections. These injections do not cause sedations or hallucinations which the oral therapies may cause.  Indication/Diagnosis: chronic migraine BOTOX(J0585) injection was performed according to protocol by [facility]. 200 units of BOTOX was dissolved into 4 cc NS.   NDC: [address]-56   Description of procedure:  The patient was placed in a sitting position. The standard protocol was used for Botox as follows, with 5 units of Botox injected at each site:   -Procerus muscle, midline injection  -Corrugator muscle, bilateral injection  -Frontalis muscle, bilateral injection, with 2 sites each side, medial injection was performed in the upper one third of the frontalis muscle, in the region vertical from the medial inferior edge of the superior orbital rim. The lateral injection was  again in the upper one third of the forehead vertically above the lateral limbus of the cornea, 1.5 cm lateral to the medial injection site.  -Temporalis muscle  injection, 5 sites, bilaterally. The first injection was 3 cm above the tragus of the ear, second injection site was 1.5 cm to 3 cm up from the first injection site in line with the tragus of the ear. The third injection site was 1.5-3 cm forward between the first 2 injection sites. The fourth injection site was 1.5 cm posterior to the second injection site. 5th site laterally in the temporalis at the level of the outer canthus.  -Occipitalis muscle injection, 3 sites, bilaterally. The first injection was done one half way between the occipital protuberance and the tip of the mastoid process behind the ear. The second injection site was done lateral and superior to the first, 1 fingerbreadth from the first injection. The third injection site was 1 fingerbreadth superiorly and medially from the first injection site.  -Cervical paraspinal muscle injection, 2 sites, bilateral knee first injection site was 1 cm from the midline of the cervical spine, 3 cm inferior to the lower border of the occipital protuberance. The second injection site was 1.5 cm superiorly and laterally to the first injection site.  - Levator Scapulae: 5 units bilaterally  -Trapezius muscle injection was performed at 3 sites, bilaterally. The first injection site was in the upper trapezius muscle halfway between the inflection point of the neck, and the acromion. The second injection site was one half way between the acromion and the first injection site. The third injection was done between the first injection site and the inflection point of the neck.   Will return for repeat injection in 3 months.   A 200 unit sof Botox was used, 175 units were injected, the rest of the Botox was wasted. The patient tolerated the procedure well, there were no complications of the above procedure.

## 2018-03-31 NOTE — Progress Notes (Signed)
Botox- 100 units x 2 vials Lot: [id number] Expiration: 07/2020 NDC: 9828-6751-98  Bacteriostatic 0.9% Sodium Chloride- 9mL total Lot: [id number] Expiration: 12/15/2018 NDC: 0699-9672-27  Dx: N37.505 B/B

## 2018-04-13 ENCOUNTER — Telehealth: Payer: Self-pay | Admitting: Neurology

## 2018-04-13 NOTE — Telephone Encounter (Signed)
Remo Lipps from [facility] called needing a PA update on pts BOTOX

## 2018-04-14 NOTE — Telephone Encounter (Signed)
Will obtain PA before next apt in April. DW

## 2018-04-19 ENCOUNTER — Telehealth: Payer: Self-pay | Admitting: Neurology

## 2018-04-19 NOTE — Telephone Encounter (Signed)
Noted, patient does not have current apt scheduled. Will take care of this change once new order is placed.

## 2018-04-19 NOTE — Telephone Encounter (Signed)
Annie Main from [facility] called stating that the PA is needing to have the Pharmacy's [phone] on it as the filling pharmacy for the [place]. Once this is done please call [facility] at [phone] to inform.

## 2018-05-16 DIAGNOSIS — M109 Gout, unspecified: Secondary | ICD-10-CM | POA: Diagnosis not present

## 2018-05-19 DIAGNOSIS — F5221 Male erectile disorder: Secondary | ICD-10-CM | POA: Diagnosis not present

## 2018-05-19 DIAGNOSIS — E6609 Other obesity due to excess calories: Secondary | ICD-10-CM | POA: Diagnosis not present

## 2018-05-19 DIAGNOSIS — E782 Mixed hyperlipidemia: Secondary | ICD-10-CM | POA: Diagnosis not present

## 2018-05-19 DIAGNOSIS — R7301 Impaired fasting glucose: Secondary | ICD-10-CM | POA: Diagnosis not present

## 2018-05-28 ENCOUNTER — Other Ambulatory Visit: Payer: Self-pay | Admitting: Neurology

## 2018-06-16 DIAGNOSIS — R7301 Impaired fasting glucose: Secondary | ICD-10-CM | POA: Diagnosis not present

## 2018-06-16 DIAGNOSIS — E782 Mixed hyperlipidemia: Secondary | ICD-10-CM | POA: Diagnosis not present

## 2018-06-16 DIAGNOSIS — M5410 Radiculopathy, site unspecified: Secondary | ICD-10-CM | POA: Diagnosis not present

## 2018-06-16 DIAGNOSIS — M545 Low back pain: Secondary | ICD-10-CM | POA: Diagnosis not present

## 2018-06-22 ENCOUNTER — Other Ambulatory Visit: Payer: Self-pay | Admitting: Neurology

## 2018-07-04 ENCOUNTER — Telehealth: Payer: Self-pay | Admitting: Neurology

## 2018-07-04 NOTE — Telephone Encounter (Signed)
Noted thank you

## 2018-07-04 NOTE — Telephone Encounter (Signed)
[place] or [place], we can schedule him for injections in the office. thanks

## 2018-07-04 NOTE — Telephone Encounter (Signed)
Pt called and was informed that due to the COVID-19 he would be called to r/s his BOTOX appt at a later date. Pt states that he can not go without his BOTOX or he will not be able to work. He states that something will have to be prescribed to him if he can not come in to get his BOTOX. Pleas advise.

## 2018-07-04 NOTE — Telephone Encounter (Signed)
Patient has been scheduled for 04/22 at 8:00am per [facility] RN request. Patiet states he has not traveled outside of the area, been exposed to COVID-19, shown any symptoms or ran a fever recently.

## 2018-07-06 ENCOUNTER — Ambulatory Visit: Payer: BLUE CROSS/BLUE SHIELD | Admitting: Neurology

## 2018-07-06 ENCOUNTER — Other Ambulatory Visit: Payer: Self-pay

## 2018-07-06 VITALS — Temp 99.5°F

## 2018-07-06 DIAGNOSIS — G43709 Chronic migraine without aura, not intractable, without status migrainosus: Secondary | ICD-10-CM | POA: Diagnosis not present

## 2018-07-06 NOTE — Progress Notes (Signed)
Botox- 100units x 2 vials Lot: [id number] Expiration: 11/2020 NDC: 7614-7092-95  Bacteriostatic 0.9% Sodium Chloride- 65mL total Lot: [id number] Expiration: 12/15/2018 NDC: 3709-6438-38  Dx: F84.037 B/B

## 2018-07-06 NOTE — Progress Notes (Signed)
4th injection >70% reduction in migraine frequency, excellent response, patient endorses being pleased with the results.   Consent Form Botulism Toxin Injection For Chronic Migraine    Reviewed orally with patient, additionally signature is on file:  Botulism toxin has been approved by the Federal drug administration for treatment of chronic migraine. Botulism toxin does not cure chronic migraine and it may not be effective in some patients.  The administration of botulism toxin is accomplished by injecting a small amount of toxin into the muscles of the neck and head. Dosage must be titrated for each individual. Any benefits resulting from botulism toxin tend to wear off after 3 months with a repeat injection required if benefit is to be maintained. Injections are usually done every 3-4 months with maximum effect peak achieved by about 2 or 3 weeks. Botulism toxin is expensive and you should be sure of what costs you will incur resulting from the injection.  The side effects of botulism toxin use for chronic migraine may include:   -Transient, and usually mild, facial weakness with facial injections  -Transient, and usually mild, head or neck weakness with head/neck injections  -Reduction or loss of forehead facial animation due to forehead muscle weakness  -Eyelid drooping  -Dry eye  -Pain at the site of injection or bruising at the site of injection  -Double vision  -Potential unknown long term risks  Contraindications: You should not have Botox if you are pregnant, nursing, allergic to albumin, have an infection, skin condition, or muscle weakness at the site of the injection, or have myasthenia gravis, Lambert-Eaton syndrome, or ALS.  It is also possible that as with any injection, there may be an allergic reaction or no effect from the medication. Reduced effectiveness after repeated injections is sometimes seen and rarely infection at the injection site may occur. All care will  be taken to prevent these side effects. If therapy is given over a long time, atrophy and wasting in the muscle injected may occur. Occasionally the patient's become refractory to treatment because they develop antibodies to the toxin. In this event, therapy needs to be modified.  I have read the above information and consent to the administration of botulism toxin.    BOTOX PROCEDURE NOTE FOR MIGRAINE HEADACHE    Contraindications and precautions discussed with patient(above). Aseptic procedure was observed and patient tolerated procedure. Procedure performed by Dr. Georgia Dom  The condition has existed for more than 6 months, and pt does not have a diagnosis of ALS, Myasthenia Gravis or Lambert-Eaton Syndrome.  Risks and benefits of injections discussed and pt agrees to proceed with the procedure.  Written consent obtained  These injections are medically necessary. Pt  receives good benefits from these injections. These injections do not cause sedations or hallucinations which the oral therapies may cause.  Indication/Diagnosis: chronic migraine BOTOX(J0585) injection was performed according to protocol by [facility]. 200 units of BOTOX was dissolved into 4 cc NS.   NDC: [address]-90   Description of procedure:  The patient was placed in a sitting position. The standard protocol was used for Botox as follows, with 5 units of Botox injected at each site:   -Procerus muscle, midline injection  -Corrugator muscle, bilateral injection  -Frontalis muscle, bilateral injection, with 2 sites each side, medial injection was performed in the upper one third of the frontalis muscle, in the region vertical from the medial inferior edge of the superior orbital rim. The lateral injection was again in the upper one  third of the forehead vertically above the lateral limbus of the cornea, 1.5 cm lateral to the medial injection site.  -Temporalis muscle injection, 5 sites, bilaterally. The first  injection was 3 cm above the tragus of the ear, second injection site was 1.5 cm to 3 cm up from the first injection site in line with the tragus of the ear. The third injection site was 1.5-3 cm forward between the first 2 injection sites. The fourth injection site was 1.5 cm posterior to the second injection site. 5th site laterally in the right temporalis at the level of the outer canthus.  -Occipitalis muscle injection, 3 sites, bilaterally. The first injection was done one half way between the occipital protuberance and the tip of the mastoid process behind the ear. The second injection site was done lateral and superior to the first, 1 fingerbreadth from the first injection. The third injection site was 1 fingerbreadth superiorly and medially from the first injection site.  -Cervical paraspinal muscle injection, 2 sites, bilateral knee first injection site was 1 cm from the midline of the cervical spine, 3 cm inferior to the lower border of the occipital protuberance. The second injection site was 1.5 cm superiorly and laterally to the first injection site.  -Trapezius muscle injection was performed at 3 sites, bilaterally. The first injection site was in the upper trapezius muscle halfway between the inflection point of the neck, and the acromion. The second injection site was one half way between the acromion and the first injection site. The third injection was done between the first injection site and the inflection point of the neck.   Will return for repeat injection in 3 months.   A 200 unit sof Botox was used, Any unused Botox was wasted. The patient tolerated the procedure well, there were no complications of the above procedure.

## 2018-07-06 NOTE — Telephone Encounter (Signed)
Spoke with pt this morning before appt. He denies any respiratory symptoms, fever, known exposure to [phone], has not been around anyone who has tested positive for covid19 or is being tested.

## 2018-07-07 ENCOUNTER — Ambulatory Visit: Payer: BLUE CROSS/BLUE SHIELD | Admitting: Neurology

## 2018-07-14 ENCOUNTER — Other Ambulatory Visit: Payer: Self-pay | Admitting: Neurology

## 2018-09-02 ENCOUNTER — Other Ambulatory Visit: Payer: Self-pay | Admitting: Neurology

## 2018-09-04 DIAGNOSIS — S46911A Strain of unspecified muscle, fascia and tendon at shoulder and upper arm level, right arm, initial encounter: Secondary | ICD-10-CM | POA: Diagnosis not present

## 2018-09-13 DIAGNOSIS — L03012 Cellulitis of left finger: Secondary | ICD-10-CM | POA: Diagnosis not present

## 2018-09-14 ENCOUNTER — Other Ambulatory Visit: Payer: Self-pay

## 2018-09-14 ENCOUNTER — Other Ambulatory Visit: Payer: BLUE CROSS/BLUE SHIELD

## 2018-09-14 DIAGNOSIS — Z20822 Contact with and (suspected) exposure to covid-19: Secondary | ICD-10-CM

## 2018-09-21 LAB — NOVEL CORONAVIRUS, NAA: SARS-CoV-2, NAA: NOT DETECTED

## 2018-09-22 ENCOUNTER — Telehealth: Payer: Self-pay | Admitting: Neurology

## 2018-09-22 NOTE — Telephone Encounter (Signed)
I called and spoke with the patient about his insurance being inactive. He is going to send [place] a picture of his new card via mychart. DW

## 2018-09-27 NOTE — Telephone Encounter (Signed)
I called the patient to inquire about his insurance but he did not answer, no option for VM. DW

## 2018-09-27 NOTE — Telephone Encounter (Signed)
I called the patient on his home phone and left a VM asking him to call us back. DW

## 2018-09-28 NOTE — Telephone Encounter (Signed)
I called the patient on his cell phone and he did not answer and no option for VM. I called him on his house phone and left another VM telling him we are cancelling his apt until we hear from him. DW

## 2018-10-03 ENCOUNTER — Ambulatory Visit: Payer: BLUE CROSS/BLUE SHIELD | Admitting: Neurology

## 2018-10-03 NOTE — Telephone Encounter (Signed)
Spoke to Patient PA has been sent to [place].

## 2018-10-04 NOTE — Telephone Encounter (Signed)
Spoke with Dr. Jaynee Eagles. Pt can be scheduled with Amy NP. She has sooner availability. Dana aware.

## 2018-10-04 NOTE — Telephone Encounter (Signed)
Called patient he is scheduled with Amy for Botox Injection He is waiting for [facility] to call Him back for TBD.

## 2018-10-04 NOTE — Telephone Encounter (Signed)
Patient has been approved [id number] 10/03/2018 - 10/03/2019 # [id number] .I have talked to patient he is going to call [facility] and give consent . [phone] .  Patient is going to call back . [facility] patient is past due do you think I can put him a slot some where end of next week because he is having headaches ?

## 2018-10-05 ENCOUNTER — Other Ambulatory Visit: Payer: Self-pay | Admitting: Neurology

## 2018-10-06 NOTE — Telephone Encounter (Signed)
Benefits still be verified  With Pharmacy .

## 2018-10-07 ENCOUNTER — Other Ambulatory Visit: Payer: Self-pay | Admitting: Neurology

## 2018-10-07 MED ORDER — METHYLPREDNISOLONE 4 MG PO TBPK: ORAL_TABLET | ORAL | 1 refills | Status: DC

## 2018-10-10 NOTE — Telephone Encounter (Signed)
Called and spoke to [name] and she had me on hold and she relayed to me that process is still going thorough [facility] . I relayed to [name] that patient had apt on 10/12/2018 [name] spoke to [name] in [facility] and she stated to call back tomorrow   10/11/2018 .

## 2018-10-11 NOTE — Telephone Encounter (Signed)
I called to check status of the patients medication. Nothing has been done, asked for a supervisor and was transferred. They escalated the request.

## 2018-10-12 ENCOUNTER — Encounter: Payer: Self-pay | Admitting: Family Medicine

## 2018-10-12 ENCOUNTER — Other Ambulatory Visit: Payer: Self-pay

## 2018-10-12 ENCOUNTER — Ambulatory Visit (INDEPENDENT_AMBULATORY_CARE_PROVIDER_SITE_OTHER): Payer: BC Managed Care – PPO | Admitting: Family Medicine

## 2018-10-12 DIAGNOSIS — G43709 Chronic migraine without aura, not intractable, without status migrainosus: Secondary | ICD-10-CM | POA: Diagnosis not present

## 2018-10-12 NOTE — Progress Notes (Addendum)
5th procedure with [facility]. He notes significant improvement in migraines. Tolerating medications well.   Consent Form Botulism Toxin Injection For Chronic Migraine    Reviewed orally with patient, additionally signature is on file:  Botulism toxin has been approved by the Federal drug administration for treatment of chronic migraine. Botulism toxin does not cure chronic migraine and it may not be effective in some patients.  The administration of botulism toxin is accomplished by injecting a small amount of toxin into the muscles of the neck and head. Dosage must be titrated for each individual. Any benefits resulting from botulism toxin tend to wear off after 3 months with a repeat injection required if benefit is to be maintained. Injections are usually done every 3-4 months with maximum effect peak achieved by about 2 or 3 weeks. Botulism toxin is expensive and you should be sure of what costs you will incur resulting from the injection.  The side effects of botulism toxin use for chronic migraine may include:   -Transient, and usually mild, facial weakness with facial injections  -Transient, and usually mild, head or neck weakness with head/neck injections  -Reduction or loss of forehead facial animation due to forehead muscle weakness  -Eyelid drooping  -Dry eye  -Pain at the site of injection or bruising at the site of injection  -Double vision  -Potential unknown long term risks   Contraindications: You should not have Botox if you are pregnant, nursing, allergic to albumin, have an infection, skin condition, or muscle weakness at the site of the injection, or have myasthenia gravis, Lambert-Eaton syndrome, or ALS.  It is also possible that as with any injection, there may be an allergic reaction or no effect from the medication. Reduced effectiveness after repeated injections is sometimes seen and rarely infection at the injection site may occur. All care will be taken to  prevent these side effects. If therapy is given over a long time, atrophy and wasting in the muscle injected may occur. Occasionally the patient's become refractory to treatment because they develop antibodies to the toxin. In this event, therapy needs to be modified.  I have read the above information and consent to the administration of botulism toxin.    BOTOX PROCEDURE NOTE FOR MIGRAINE HEADACHE  Contraindications and precautions discussed with patient(above). Aseptic procedure was observed and patient tolerated procedure. Procedure performed by Debbora Presto, FNP-C.   The condition has existed for more than 6 months, and pt does not have a diagnosis of ALS, Myasthenia Gravis or Lambert-Eaton Syndrome.  Risks and benefits of injections discussed and pt agrees to proceed with the procedure.  Written consent obtained  These injections are medically necessary. Pt  receives good benefits from these injections. These injections do not cause sedations or hallucinations which the oral therapies may cause.   Description of procedure:  The patient was placed in a sitting position. The standard protocol was used for Botox as follows, with 5 units of Botox injected at each site:  -Procerus muscle, midline injection  -Corrugator muscle, bilateral injection  -Frontalis muscle, bilateral injection, with 2 sites each side, medial injection was performed in the upper one third of the frontalis muscle, in the region vertical from the medial inferior edge of the superior orbital rim. The lateral injection was again in the upper one third of the forehead vertically above the lateral limbus of the cornea, 1.5 cm lateral to the medial injection site.  -Temporalis muscle injection, 4 sites, bilaterally. The first injection  was 3 cm above the tragus of the ear, second injection site was 1.5 cm to 3 cm up from the first injection site in line with the tragus of the ear. The third injection site was 1.5-3 cm forward  between the first 2 injection sites. The fourth injection site was 1.5 cm posterior to the second injection site. 5th site laterally in the temporalis  muscleat the level of the outer canthus.  -Occipitalis muscle injection, 3 sites, bilaterally. The first injection was done one half way between the occipital protuberance and the tip of the mastoid process behind the ear. The second injection site was done lateral and superior to the first, 1 fingerbreadth from the first injection. The third injection site was 1 fingerbreadth superiorly and medially from the first injection site.  -Cervical paraspinal muscle injection, 2 sites, bilateral knee first injection site was 1 cm from the midline of the cervical spine, 3 cm inferior to the lower border of the occipital protuberance. The second injection site was 1.5 cm superiorly and laterally to the first injection site.  -Trapezius muscle injection was performed at 3 sites, bilaterally. The first injection site was in the upper trapezius muscle halfway between the inflection point of the neck, and the acromion. The second injection site was one half way between the acromion and the first injection site. The third injection was done between the first injection site and the inflection point of the neck.   Will return for repeat injection in 3 months.   A 155 units of Botox was used, any Botox not injected was wasted. The patient tolerated the procedure well, there were no complications of the above procedure.

## 2018-10-12 NOTE — Progress Notes (Signed)
Botox- 200 units x 4 vials Lot: [id number] Expiration: 01/2021 NDC: 0932-6712-45  Bacteriostatic 0.9% Sodium Chloride- 71mL total Lot: [id number] Expiration: 12/15/2018 NDC: 3825-0539-76  Dx: B34.193 B/B

## 2018-10-27 ENCOUNTER — Telehealth: Payer: Self-pay | Admitting: Neurology

## 2018-10-27 NOTE — Telephone Encounter (Signed)
Botox TBD [facility] . 11/01/2018 . Botox

## 2018-10-31 DIAGNOSIS — M25511 Pain in right shoulder: Secondary | ICD-10-CM | POA: Diagnosis not present

## 2018-10-31 DIAGNOSIS — N529 Male erectile dysfunction, unspecified: Secondary | ICD-10-CM | POA: Diagnosis not present

## 2018-10-31 DIAGNOSIS — R030 Elevated blood-pressure reading, without diagnosis of hypertension: Secondary | ICD-10-CM | POA: Diagnosis not present

## 2018-10-31 NOTE — Telephone Encounter (Signed)
I called [facility] and canceled the order, I spoke with Wille Glaser to confirm. DW

## 2018-11-03 DIAGNOSIS — M25511 Pain in right shoulder: Secondary | ICD-10-CM | POA: Diagnosis not present

## 2018-11-03 DIAGNOSIS — M542 Cervicalgia: Secondary | ICD-10-CM | POA: Diagnosis not present

## 2018-11-04 ENCOUNTER — Other Ambulatory Visit: Payer: Self-pay | Admitting: Orthopaedic Surgery

## 2018-11-04 DIAGNOSIS — M25511 Pain in right shoulder: Secondary | ICD-10-CM

## 2018-11-19 ENCOUNTER — Ambulatory Visit
Admission: RE | Admit: 2018-11-19 | Discharge: 2018-11-19 | Disposition: A | Discharge status: Home / Self Care | Payer: BC Managed Care – PPO | Source: Ambulatory Visit | Attending: Orthopaedic Surgery | Admitting: Orthopaedic Surgery

## 2018-11-19 ENCOUNTER — Other Ambulatory Visit: Payer: Self-pay

## 2018-11-19 DIAGNOSIS — M25511 Pain in right shoulder: Secondary | ICD-10-CM | POA: Diagnosis not present

## 2018-11-22 DIAGNOSIS — M25511 Pain in right shoulder: Secondary | ICD-10-CM | POA: Diagnosis not present

## 2018-11-28 ENCOUNTER — Other Ambulatory Visit: Payer: Self-pay | Admitting: Neurology

## 2019-01-16 DIAGNOSIS — F331 Major depressive disorder, recurrent, moderate: Secondary | ICD-10-CM | POA: Diagnosis not present

## 2019-01-18 ENCOUNTER — Ambulatory Visit: Payer: BC Managed Care – PPO | Admitting: Family Medicine

## 2019-01-18 ENCOUNTER — Other Ambulatory Visit: Payer: Self-pay | Admitting: Neurology

## 2019-01-18 ENCOUNTER — Other Ambulatory Visit: Payer: Self-pay

## 2019-01-18 ENCOUNTER — Encounter: Payer: Self-pay | Admitting: Family Medicine

## 2019-01-18 DIAGNOSIS — G43709 Chronic migraine without aura, not intractable, without status migrainosus: Secondary | ICD-10-CM | POA: Diagnosis not present

## 2019-01-18 NOTE — Progress Notes (Signed)
Botox 200 units/vial Lot number :[id number] Expires 07/2021 [place] number is [id number]

## 2019-01-18 NOTE — Progress Notes (Signed)
Consent Form Botulism Toxin Injection For Chronic Migraine  6th Botox procedure with [facility]. Botox works well from day 3 following procedure to week 10-11 then migraines return. He has a migraine today that has been present for a couple of days. He is using naratriptan for abortive therapy. He will continue phenergan as needed for nausea.   Reviewed orally with patient, additionally signature is on file:  Botulism toxin has been approved by the Federal drug administration for treatment of chronic migraine. Botulism toxin does not cure chronic migraine and it may not be effective in some patients.  The administration of botulism toxin is accomplished by injecting a small amount of toxin into the muscles of the neck and head. Dosage must be titrated for each individual. Any benefits resulting from botulism toxin tend to wear off after 3 months with a repeat injection required if benefit is to be maintained. Injections are usually done every 3-4 months with maximum effect peak achieved by about 2 or 3 weeks. Botulism toxin is expensive and you should be sure of what costs you will incur resulting from the injection.  The side effects of botulism toxin use for chronic migraine may include:   -Transient, and usually mild, facial weakness with facial injections  -Transient, and usually mild, head or neck weakness with head/neck injections  -Reduction or loss of forehead facial animation due to forehead muscle weakness  -Eyelid drooping  -Dry eye  -Pain at the site of injection or bruising at the site of injection  -Double vision  -Potential unknown long term risks   Contraindications: You should not have Botox if you are pregnant, nursing, allergic to albumin, have an infection, skin condition, or muscle weakness at the site of the injection, or have myasthenia gravis, Lambert-Eaton syndrome, or ALS.  It is also possible that as with any injection, there may be an allergic reaction or no effect  from the medication. Reduced effectiveness after repeated injections is sometimes seen and rarely infection at the injection site may occur. All care will be taken to prevent these side effects. If therapy is given over a long time, atrophy and wasting in the muscle injected may occur. Occasionally the patient's become refractory to treatment because they develop antibodies to the toxin. In this event, therapy needs to be modified.  I have read the above information and consent to the administration of botulism toxin.    BOTOX PROCEDURE NOTE FOR MIGRAINE HEADACHE  Contraindications and precautions discussed with patient(above). Aseptic procedure was observed and patient tolerated procedure. Procedure performed by Debbora Presto, FNP-C.   The condition has existed for more than 6 months, and pt does not have a diagnosis of ALS, Myasthenia Gravis or Lambert-Eaton Syndrome.  Risks and benefits of injections discussed and pt agrees to proceed with the procedure.  Written consent obtained  These injections are medically necessary. Pt  receives good benefits from these injections. These injections do not cause sedations or hallucinations which the oral therapies may cause.   Description of procedure:  The patient was placed in a sitting position. The standard protocol was used for Botox as follows, with 5 units of Botox injected at each site:  -Procerus muscle, midline injection  -Corrugator muscle, bilateral injection  -Frontalis muscle, bilateral injection, with 2 sites each side, medial injection was performed in the upper one third of the frontalis muscle, in the region vertical from the medial inferior edge of the superior orbital rim. The lateral injection was again in the  upper one third of the forehead vertically above the lateral limbus of the cornea, 1.5 cm lateral to the medial injection site.  -Temporalis muscle injection, 4 sites, bilaterally. The first injection was 3 cm above the tragus  of the ear, second injection site was 1.5 cm to 3 cm up from the first injection site in line with the tragus of the ear. The third injection site was 1.5-3 cm forward between the first 2 injection sites. The fourth injection site was 1.5 cm posterior to the second injection site. 5th site laterally in the temporalis  muscleat the level of the outer canthus.  -Occipitalis muscle injection, 3 sites, bilaterally. The first injection was done one half way between the occipital protuberance and the tip of the mastoid process behind the ear. The second injection site was done lateral and superior to the first, 1 fingerbreadth from the first injection. The third injection site was 1 fingerbreadth superiorly and medially from the first injection site.  -Cervical paraspinal muscle injection, 2 sites, bilateral knee first injection site was 1 cm from the midline of the cervical spine, 3 cm inferior to the lower border of the occipital protuberance. The second injection site was 1.5 cm superiorly and laterally to the first injection site.  -Trapezius muscle injection was performed at 3 sites, bilaterally. The first injection site was in the upper trapezius muscle halfway between the inflection point of the neck, and the acromion. The second injection site was one half way between the acromion and the first injection site. The third injection was done between the first injection site and the inflection point of the neck.   Will return for repeat injection in 3 months.   A 155 units of Botox was used, any Botox not injected was wasted. The patient tolerated the procedure well, there were no complications of the above procedure.

## 2019-04-08 ENCOUNTER — Other Ambulatory Visit: Payer: Self-pay | Admitting: Neurology

## 2019-04-19 MED ORDER — BOTOX 100 UNITS IJ SOLR: INTRAMUSCULAR | 1 refills | Status: DC

## 2019-04-19 NOTE — Addendum Note (Signed)
Addended by: Gildardo Griffes on: 04/19/2019 02:21 PM   Modules accepted: Orders

## 2019-04-19 NOTE — Telephone Encounter (Signed)
Please send Botox script over to [facility] [place]. DWD

## 2019-04-20 ENCOUNTER — Telehealth: Payer: Self-pay

## 2019-04-20 NOTE — Telephone Encounter (Incomplete)
Error. DWD  

## 2019-04-25 ENCOUNTER — Encounter: Payer: Self-pay | Admitting: Family Medicine

## 2019-04-25 ENCOUNTER — Other Ambulatory Visit: Payer: Self-pay

## 2019-04-25 ENCOUNTER — Ambulatory Visit: Payer: 59 | Admitting: Family Medicine

## 2019-04-25 ENCOUNTER — Telehealth: Payer: Self-pay | Admitting: Family Medicine

## 2019-04-25 VITALS — Temp 97.1°F

## 2019-04-25 DIAGNOSIS — G43709 Chronic migraine without aura, not intractable, without status migrainosus: Secondary | ICD-10-CM

## 2019-04-25 NOTE — Telephone Encounter (Signed)
botox 12 weeks

## 2019-04-25 NOTE — Progress Notes (Signed)
Today he returns for 8th Botox procedure. He is doing well. Recently had rotator cuff surgery. He continues naratriptan for abortive care but insurance only allows for 4 tablets monthly. He is not needing more than that right now but is concerned about summer months when headache are typically worse.   Consent Form Botulism Toxin Injection For Chronic Migraine    Reviewed orally with patient, additionally signature is on file:  Botulism toxin has been approved by the Federal drug administration for treatment of chronic migraine. Botulism toxin does not cure chronic migraine and it may not be effective in some patients.  The administration of botulism toxin is accomplished by injecting a small amount of toxin into the muscles of the neck and head. Dosage must be titrated for each individual. Any benefits resulting from botulism toxin tend to wear off after 3 months with a repeat injection required if benefit is to be maintained. Injections are usually done every 3-4 months with maximum effect peak achieved by about 2 or 3 weeks. Botulism toxin is expensive and you should be sure of what costs you will incur resulting from the injection.  The side effects of botulism toxin use for chronic migraine may include:   -Transient, and usually mild, facial weakness with facial injections  -Transient, and usually mild, head or neck weakness with head/neck injections  -Reduction or loss of forehead facial animation due to forehead muscle weakness  -Eyelid drooping  -Dry eye  -Pain at the site of injection or bruising at the site of injection  -Double vision  -Potential unknown long term risks   Contraindications: You should not have Botox if you are pregnant, nursing, allergic to albumin, have an infection, skin condition, or muscle weakness at the site of the injection, or have myasthenia gravis, Lambert-Eaton syndrome, or ALS.  It is also possible that as with any injection, there may be an  allergic reaction or no effect from the medication. Reduced effectiveness after repeated injections is sometimes seen and rarely infection at the injection site may occur. All care will be taken to prevent these side effects. If therapy is given over a long time, atrophy and wasting in the muscle injected may occur. Occasionally the patient's become refractory to treatment because they develop antibodies to the toxin. In this event, therapy needs to be modified.  I have read the above information and consent to the administration of botulism toxin.    BOTOX PROCEDURE NOTE FOR MIGRAINE HEADACHE  Contraindications and precautions discussed with patient(above). Aseptic procedure was observed and patient tolerated procedure. Procedure performed by Debbora Presto, FNP-C.   The condition has existed for more than 6 months, and pt does not have a diagnosis of ALS, Myasthenia Gravis or Lambert-Eaton Syndrome.  Risks and benefits of injections discussed and pt agrees to proceed with the procedure.  Written consent obtained  These injections are medically necessary. Pt  receives good benefits from these injections. These injections do not cause sedations or hallucinations which the oral therapies may cause.   Description of procedure:  The patient was placed in a sitting position. The standard protocol was used for Botox as follows, with 5 units of Botox injected at each site:  -Procerus muscle, midline injection  -Corrugator muscle, bilateral injection  -Frontalis muscle, bilateral injection, with 2 sites each side, medial injection was performed in the upper one third of the frontalis muscle, in the region vertical from the medial inferior edge of the superior orbital rim. The lateral injection  was again in the upper one third of the forehead vertically above the lateral limbus of the cornea, 1.5 cm lateral to the medial injection site.  -Temporalis muscle injection, 4 sites, bilaterally. The first  injection was 3 cm above the tragus of the ear, second injection site was 1.5 cm to 3 cm up from the first injection site in line with the tragus of the ear. The third injection site was 1.5-3 cm forward between the first 2 injection sites. The fourth injection site was 1.5 cm posterior to the second injection site. 5th site laterally in the temporalis  muscleat the level of the outer canthus.  -Occipitalis muscle injection, 3 sites, bilaterally. The first injection was done one half way between the occipital protuberance and the tip of the mastoid process behind the ear. The second injection site was done lateral and superior to the first, 1 fingerbreadth from the first injection. The third injection site was 1 fingerbreadth superiorly and medially from the first injection site.  -Cervical paraspinal muscle injection, 2 sites, bilateral knee first injection site was 1 cm from the midline of the cervical spine, 3 cm inferior to the lower border of the occipital protuberance. The second injection site was 1.5 cm superiorly and laterally to the first injection site.  -Trapezius muscle injection was performed at 3 sites, bilaterally. The first injection site was in the upper trapezius muscle halfway between the inflection point of the neck, and the acromion. The second injection site was one half way between the acromion and the first injection site. The third injection was done between the first injection site and the inflection point of the neck.   Will return for repeat injection in 3 months.   A total of 200 units of Botox was prepared, 155 units of Botox was injected as documented above, any Botox not injected was wasted. The patient tolerated the procedure well, there were no complications of the above procedure.

## 2019-04-25 NOTE — Progress Notes (Signed)
Botox- 200 units x 4 vials Lot: [id number] Expiration: 12/2021 NDC: [id number]  Bacteriostatic 0.9% Sodium Chloride- 29mL total Lot: [id number] Expiration: 06/15/2019 NDC: [id number]  Dx: [id number] S/P

## 2019-05-28 ENCOUNTER — Other Ambulatory Visit: Payer: Self-pay | Admitting: Neurology

## 2019-07-12 ENCOUNTER — Telehealth: Payer: Self-pay | Admitting: *Deleted

## 2019-07-12 NOTE — Telephone Encounter (Signed)
Patient has a Botox appointment on Jul 26, 2019.  I called [employer] [phone] and spoke to [place].  She states that [id number] and [phone] are valid and billable and do not require PA.  The Botox must come through the patients specialty pharmacy, [facility].  Ref# for this call is [id number].  I called [facility] and spoke to Romie Minus to check status of prescription and to schedule delivery. She states they have consent and delivery was scheduled for 07/13/2019.

## 2019-07-26 ENCOUNTER — Encounter: Payer: Self-pay | Admitting: Family Medicine

## 2019-07-26 ENCOUNTER — Ambulatory Visit: Payer: 59 | Admitting: Family Medicine

## 2019-07-26 ENCOUNTER — Other Ambulatory Visit: Payer: Self-pay

## 2019-07-26 DIAGNOSIS — G43709 Chronic migraine without aura, not intractable, without status migrainosus: Secondary | ICD-10-CM

## 2019-07-26 NOTE — Progress Notes (Signed)
Botox 100 (2 vials) Lot #: [id number] EXP 12/2021 Ndc: [id number] [place]

## 2019-07-26 NOTE — Progress Notes (Addendum)
He is doing well. He feels that migraines are well managed until week prior to next procedure. Amerge helps with abortive therapy. His daughter is graduating from college in [place]. He is happy with Botox therapy.    Consent Form Botulism Toxin Injection For Chronic Migraine    Reviewed orally with patient, additionally signature is on file:  Botulism toxin has been approved by the Federal drug administration for treatment of chronic migraine. Botulism toxin does not cure chronic migraine and it may not be effective in some patients.  The administration of botulism toxin is accomplished by injecting a small amount of toxin into the muscles of the neck and head. Dosage must be titrated for each individual. Any benefits resulting from botulism toxin tend to wear off after 3 months with a repeat injection required if benefit is to be maintained. Injections are usually done every 3-4 months with maximum effect peak achieved by about 2 or 3 weeks. Botulism toxin is expensive and you should be sure of what costs you will incur resulting from the injection.  The side effects of botulism toxin use for chronic migraine may include:   -Transient, and usually mild, facial weakness with facial injections  -Transient, and usually mild, head or neck weakness with head/neck injections  -Reduction or loss of forehead facial animation due to forehead muscle weakness  -Eyelid drooping  -Dry eye  -Pain at the site of injection or bruising at the site of injection  -Double vision  -Potential unknown long term risks   Contraindications: You should not have Botox if you are pregnant, nursing, allergic to albumin, have an infection, skin condition, or muscle weakness at the site of the injection, or have myasthenia gravis, Lambert-Eaton syndrome, or ALS.  It is also possible that as with any injection, there may be an allergic reaction or no effect from the medication. Reduced effectiveness after  repeated injections is sometimes seen and rarely infection at the injection site may occur. All care will be taken to prevent these side effects. If therapy is given over a long time, atrophy and wasting in the muscle injected may occur. Occasionally the patient's become refractory to treatment because they develop antibodies to the toxin. In this event, therapy needs to be modified.  I have read the above information and consent to the administration of botulism toxin.    BOTOX PROCEDURE NOTE FOR MIGRAINE HEADACHE  Contraindications and precautions discussed with patient(above). Aseptic procedure was observed and patient tolerated procedure. Procedure performed by Debbora Presto, FNP-C.   The condition has existed for more than 6 months, and pt does not have a diagnosis of ALS, Myasthenia Gravis or Lambert-Eaton Syndrome.  Risks and benefits of injections discussed and pt agrees to proceed with the procedure.  Written consent obtained  These injections are medically necessary. Pt  receives good benefits from these injections. These injections do not cause sedations or hallucinations which the oral therapies may cause.   Description of procedure:  The patient was placed in a sitting position. The standard protocol was used for Botox as follows, with 5 units of Botox injected at each site:  -Procerus muscle, midline injection  -Corrugator muscle, bilateral injection  -Frontalis muscle, bilateral injection, with 2 sites each side, medial injection was performed in the upper one third of the frontalis muscle, in the region vertical from the medial inferior edge of the superior orbital rim. The lateral injection was again in the upper one third of the forehead vertically above  the lateral limbus of the cornea, 1.5 cm lateral to the medial injection site.  -Temporalis muscle injection, 4 sites, bilaterally. The first injection was 3 cm above the tragus of the ear, second injection site was 1.5 cm to 3  cm up from the first injection site in line with the tragus of the ear. The third injection site was 1.5-3 cm forward between the first 2 injection sites. The fourth injection site was 1.5 cm posterior to the second injection site. 5th site laterally in the temporalis  muscleat the level of the outer canthus.  -Occipitalis muscle injection, 3 sites, bilaterally. The first injection was done one half way between the occipital protuberance and the tip of the mastoid process behind the ear. The second injection site was done lateral and superior to the first, 1 fingerbreadth from the first injection. The third injection site was 1 fingerbreadth superiorly and medially from the first injection site.  -Cervical paraspinal muscle injection, 2 sites, bilaterally. The first injection site was 1 cm from the midline of the cervical spine, 3 cm inferior to the lower border of the occipital protuberance. The second injection site was 1.5 cm superiorly and laterally to the first injection site.  -Trapezius muscle injection was performed at 3 sites, bilaterally. The first injection site was in the upper trapezius muscle halfway between the inflection point of the neck, and the acromion. The second injection site was one half way between the acromion and the first injection site. The third injection was done between the first injection site and the inflection point of the neck.   Will return for repeat injection in 3 months.   A total of 200 units of Botox was prepared, 155 units of Botox was injected as documented above, any Botox not injected was wasted. The patient tolerated the procedure well, there were no complications of the above procedure.  Made any corrections needed, and agree with procedure    Sarina Ill, MD [facility]

## 2019-08-10 ENCOUNTER — Other Ambulatory Visit: Payer: Self-pay | Admitting: *Deleted

## 2019-08-10 MED ORDER — NARATRIPTAN HCL 2.5 MG PO TABS: ORAL_TABLET | ORAL | 1 refills | Status: DC

## 2019-08-29 ENCOUNTER — Telehealth: Payer: Self-pay | Admitting: Family Medicine

## 2019-08-29 NOTE — Telephone Encounter (Signed)
Pt's wife Chrys Racer called to speak to BOTOX Coordinator and give her ins. Information that was requested. Please advise.

## 2019-08-30 NOTE — Telephone Encounter (Signed)
I returned patient's wife's call this morning. She did not answer. Will attempt to call again later.

## 2019-09-04 NOTE — Telephone Encounter (Signed)
Patient's wife Chrys Racer called and gave me the insurance information I needed to call [facility] about patient's Botox PA, if needed. Patient's policy #[id number], group [id number].

## 2019-10-02 ENCOUNTER — Telehealth: Payer: Self-pay | Admitting: Family Medicine

## 2019-10-02 ENCOUNTER — Other Ambulatory Visit: Payer: Self-pay | Admitting: Neurology

## 2019-10-02 NOTE — Telephone Encounter (Signed)
Patient has upcoming Botox appointment on 8/17. I called [employer] [phone] and spoke with Chloe. She states that codes [phone] and [phone] do not require PA for G43.709. She states patient can not be buy & bill, he must use Sales promotion account executive. Reference #[id number]. I submitted PA request to [facility] via CoverMyMeds. Waiting for response.

## 2019-10-04 ENCOUNTER — Telehealth: Payer: Self-pay | Admitting: Family Medicine

## 2019-10-04 NOTE — Telephone Encounter (Signed)
I called [facility] and spoke with [facility] about scheduling Botox delivery for patient's 8/17 appointment. She states patient has no refills left. She transferred me to the pharmacy and I spoke with Cecille Rubin to give her the prescription.

## 2019-10-19 ENCOUNTER — Encounter: Payer: Self-pay | Admitting: Family Medicine

## 2019-10-23 MED ORDER — NARATRIPTAN HCL 2.5 MG PO TABS: ORAL_TABLET | ORAL | 1 refills | Status: DC

## 2019-10-23 NOTE — Telephone Encounter (Signed)
I changed Amerge prescription to #9.

## 2019-10-25 NOTE — Telephone Encounter (Signed)
I called [facility] to check the status of patient's Botox order. I spoke with Sonia Baller. She states that the order still needs to go through medical benefit review. She states this should be completed by tomorrow and we will be called to schedule delivery of Botox.

## 2019-10-26 NOTE — Telephone Encounter (Signed)
I called [facility] to check the status of medication. I spoke with Von, who helped me set up delivery of Botox for 8/16.

## 2019-10-30 NOTE — Telephone Encounter (Signed)
Received Botox from [facility] (1) 200U vial for 8/17 appointment.

## 2019-10-31 ENCOUNTER — Ambulatory Visit: Payer: 59 | Admitting: Family Medicine

## 2019-10-31 DIAGNOSIS — G43709 Chronic migraine without aura, not intractable, without status migrainosus: Secondary | ICD-10-CM

## 2019-10-31 MED ORDER — PROCHLORPERAZINE MALEATE 10 MG PO TABS: 10.0000 mg | ORAL_TABLET | Freq: Four times a day (QID) | ORAL | 0 refills | Status: AC | PRN

## 2019-10-31 NOTE — Progress Notes (Signed)
Botox- 200 units x 1 vial Lot: [id number] Expiration: 06/2022 NDC: [id number]   Bacteriostatic 0.9% Sodium Chloride- 13mL total [id number]    Expiration: 02/22 NDC: 52591-028-90   Dx: [id number] S/P

## 2019-10-31 NOTE — Progress Notes (Signed)
He continues to do well on Botox therapy. Migraine frequency has increased recently. He feels this is due to being in the heat with his new position at work. He is working on Designer, fashion/clothing intake. Amerge is helpful for abortive therapy. He is requesting refill for compazine that helps with nausea. 30 tablets has lasted 3 years.    Consent Form Botulism Toxin Injection For Chronic Migraine    Reviewed orally with patient, additionally signature is on file:  Botulism toxin has been approved by the Federal drug administration for treatment of chronic migraine. Botulism toxin does not cure chronic migraine and it may not be effective in some patients.  The administration of botulism toxin is accomplished by injecting a small amount of toxin into the muscles of the neck and head. Dosage must be titrated for each individual. Any benefits resulting from botulism toxin tend to wear off after 3 months with a repeat injection required if benefit is to be maintained. Injections are usually done every 3-4 months with maximum effect peak achieved by about 2 or 3 weeks. Botulism toxin is expensive and you should be sure of what costs you will incur resulting from the injection.  The side effects of botulism toxin use for chronic migraine may include:   -Transient, and usually mild, facial weakness with facial injections  -Transient, and usually mild, head or neck weakness with head/neck injections  -Reduction or loss of forehead facial animation due to forehead muscle weakness  -Eyelid drooping  -Dry eye  -Pain at the site of injection or bruising at the site of injection  -Double vision  -Potential unknown long term risks   Contraindications: You should not have Botox if you are pregnant, nursing, allergic to albumin, have an infection, skin condition, or muscle weakness at the site of the injection, or have myasthenia gravis, Lambert-Eaton syndrome, or ALS.  It is also possible that as with  any injection, there may be an allergic reaction or no effect from the medication. Reduced effectiveness after repeated injections is sometimes seen and rarely infection at the injection site may occur. All care will be taken to prevent these side effects. If therapy is given over a long time, atrophy and wasting in the muscle injected may occur. Occasionally the patient's become refractory to treatment because they develop antibodies to the toxin. In this event, therapy needs to be modified.  I have read the above information and consent to the administration of botulism toxin.    BOTOX PROCEDURE NOTE FOR MIGRAINE HEADACHE  Contraindications and precautions discussed with patient(above). Aseptic procedure was observed and patient tolerated procedure. Procedure performed by Debbora Presto, FNP-C.   The condition has existed for more than 6 months, and pt does not have a diagnosis of ALS, Myasthenia Gravis or Lambert-Eaton Syndrome.  Risks and benefits of injections discussed and pt agrees to proceed with the procedure.  Written consent obtained  These injections are medically necessary. Pt  receives good benefits from these injections. These injections do not cause sedations or hallucinations which the oral therapies may cause.   Description of procedure:  The patient was placed in a sitting position. The standard protocol was used for Botox as follows, with 5 units of Botox injected at each site:  -Procerus muscle, midline injection  -Corrugator muscle, bilateral injection  -Frontalis muscle, bilateral injection, with 2 sites each side, medial injection was performed in the upper one third of the frontalis muscle, in the region vertical from the medial inferior  edge of the superior orbital rim. The lateral injection was again in the upper one third of the forehead vertically above the lateral limbus of the cornea, 1.5 cm lateral to the medial injection site.  -Temporalis muscle injection, 4  sites, bilaterally. The first injection was 3 cm above the tragus of the ear, second injection site was 1.5 cm to 3 cm up from the first injection site in line with the tragus of the ear. The third injection site was 1.5-3 cm forward between the first 2 injection sites. The fourth injection site was 1.5 cm posterior to the second injection site. 5th site laterally in the temporalis  muscleat the level of the outer canthus.  -Occipitalis muscle injection, 3 sites, bilaterally. The first injection was done one half way between the occipital protuberance and the tip of the mastoid process behind the ear. The second injection site was done lateral and superior to the first, 1 fingerbreadth from the first injection. The third injection site was 1 fingerbreadth superiorly and medially from the first injection site.  -Cervical paraspinal muscle injection, 2 sites, bilaterally. The first injection site was 1 cm from the midline of the cervical spine, 3 cm inferior to the lower border of the occipital protuberance. The second injection site was 1.5 cm superiorly and laterally to the first injection site.  -Trapezius muscle injection was performed at 3 sites, bilaterally. The first injection site was in the upper trapezius muscle halfway between the inflection point of the neck, and the acromion. The second injection site was one half way between the acromion and the first injection site. The third injection was done between the first injection site and the inflection point of the neck.   Will return for repeat injection in 3 months.   A total of 200 units of Botox was prepared, 155 units of Botox was injected as documented above, any Botox not injected was wasted. The patient tolerated the procedure well, there were no complications of the above procedure.

## 2019-11-29 ENCOUNTER — Other Ambulatory Visit: Payer: Self-pay | Admitting: Neurology

## 2019-11-30 ENCOUNTER — Telehealth: Payer: Self-pay | Admitting: Neurology

## 2019-11-30 MED ORDER — NARATRIPTAN HCL 2.5 MG PO TABS: ORAL_TABLET | ORAL | 1 refills | Status: DC

## 2019-11-30 NOTE — Telephone Encounter (Addendum)
Renewed amerge for pt tp pharmacy. Relayed to pt that prescription sent to pharmacy.  He verbalized understanding.

## 2019-11-30 NOTE — Addendum Note (Signed)
Addended by: Brandon Melnick on: 11/30/2019 01:56 PM   Modules accepted: Orders

## 2019-11-30 NOTE — Telephone Encounter (Signed)
Pt states he was told naratriptan (AMERGE) 2.5 MG tablet was denied by Dr according to the pharmacy.  Pt states Amy,NP just made an adjustment to the medication.  If pt is called and he is unavailable, please leave a message.

## 2020-01-15 ENCOUNTER — Telehealth: Payer: Self-pay | Admitting: Family Medicine

## 2020-01-15 NOTE — Telephone Encounter (Signed)
Patient has a Botox appointment on 11/17. I called [facility] and spoke with Lelan Pons to schedule Botox delivery. Botox TBD 11/9.

## 2020-01-24 NOTE — Telephone Encounter (Signed)
I called [facility] because medication did not arrive as scheduled. I spoke with Corey Skains who states that patient did not give consent for this shipment. Jada placed me on hold and was able to contact the patient to get consent. She rescheduled Botox delivery for 11/16.

## 2020-01-30 NOTE — Telephone Encounter (Signed)
200U vial of Botox arrived today from [facility] for patient's 11/24 appointment (patient rescheduled 11/17 appointment due to traveling).

## 2020-01-31 ENCOUNTER — Ambulatory Visit: Payer: 59 | Admitting: Family Medicine

## 2020-02-07 ENCOUNTER — Ambulatory Visit (INDEPENDENT_AMBULATORY_CARE_PROVIDER_SITE_OTHER): Payer: 59 | Admitting: Family Medicine

## 2020-02-07 ENCOUNTER — Other Ambulatory Visit: Payer: Self-pay

## 2020-02-07 DIAGNOSIS — G43709 Chronic migraine without aura, not intractable, without status migrainosus: Secondary | ICD-10-CM | POA: Diagnosis not present

## 2020-02-07 NOTE — Progress Notes (Signed)
He is doing well. He is now working nights. He does have more headaches toward end of Botox cycle. Amerge helps.   Consent Form Botulism Toxin Injection For Chronic Migraine    Reviewed orally with patient, additionally signature is on file:  Botulism toxin has been approved by the Federal drug administration for treatment of chronic migraine. Botulism toxin does not cure chronic migraine and it may not be effective in some patients.  The administration of botulism toxin is accomplished by injecting a small amount of toxin into the muscles of the neck and head. Dosage must be titrated for each individual. Any benefits resulting from botulism toxin tend to wear off after 3 months with a repeat injection required if benefit is to be maintained. Injections are usually done every 3-4 months with maximum effect peak achieved by about 2 or 3 weeks. Botulism toxin is expensive and you should be sure of what costs you will incur resulting from the injection.  The side effects of botulism toxin use for chronic migraine may include:   -Transient, and usually mild, facial weakness with facial injections  -Transient, and usually mild, head or neck weakness with head/neck injections  -Reduction or loss of forehead facial animation due to forehead muscle weakness  -Eyelid drooping  -Dry eye  -Pain at the site of injection or bruising at the site of injection  -Double vision  -Potential unknown long term risks   Contraindications: You should not have Botox if you are pregnant, nursing, allergic to albumin, have an infection, skin condition, or muscle weakness at the site of the injection, or have myasthenia gravis, Lambert-Eaton syndrome, or ALS.  It is also possible that as with any injection, there may be an allergic reaction or no effect from the medication. Reduced effectiveness after repeated injections is sometimes seen and rarely infection at the injection site may occur. All care will be  taken to prevent these side effects. If therapy is given over a long time, atrophy and wasting in the muscle injected may occur. Occasionally the patient's become refractory to treatment because they develop antibodies to the toxin. In this event, therapy needs to be modified.  I have read the above information and consent to the administration of botulism toxin.    BOTOX PROCEDURE NOTE FOR MIGRAINE HEADACHE  Contraindications and precautions discussed with patient(above). Aseptic procedure was observed and patient tolerated procedure. Procedure performed by Debbora Presto, FNP-C.   The condition has existed for more than 6 months, and pt does not have a diagnosis of ALS, Myasthenia Gravis or Lambert-Eaton Syndrome.  Risks and benefits of injections discussed and pt agrees to proceed with the procedure.  Written consent obtained  These injections are medically necessary. Pt  receives good benefits from these injections. These injections do not cause sedations or hallucinations which the oral therapies may cause.   Description of procedure:  The patient was placed in a sitting position. The standard protocol was used for Botox as follows, with 5 units of Botox injected at each site:  -Procerus muscle, midline injection  -Corrugator muscle, bilateral injection  -Frontalis muscle, bilateral injection, with 2 sites each side, medial injection was performed in the upper one third of the frontalis muscle, in the region vertical from the medial inferior edge of the superior orbital rim. The lateral injection was again in the upper one third of the forehead vertically above the lateral limbus of the cornea, 1.5 cm lateral to the medial injection site.  -Temporalis muscle  injection, 4 sites, bilaterally. The first injection was 3 cm above the tragus of the ear, second injection site was 1.5 cm to 3 cm up from the first injection site in line with the tragus of the ear. The third injection site was 1.5-3  cm forward between the first 2 injection sites. The fourth injection site was 1.5 cm posterior to the second injection site. 5th site laterally in the temporalis  muscleat the level of the outer canthus.  -Occipitalis muscle injection, 3 sites, bilaterally. The first injection was done one half way between the occipital protuberance and the tip of the mastoid process behind the ear. The second injection site was done lateral and superior to the first, 1 fingerbreadth from the first injection. The third injection site was 1 fingerbreadth superiorly and medially from the first injection site.  -Cervical paraspinal muscle injection, 2 sites, bilaterally. The first injection site was 1 cm from the midline of the cervical spine, 3 cm inferior to the lower border of the occipital protuberance. The second injection site was 1.5 cm superiorly and laterally to the first injection site.  -Trapezius muscle injection was performed at 3 sites, bilaterally. The first injection site was in the upper trapezius muscle halfway between the inflection point of the neck, and the acromion. The second injection site was one half way between the acromion and the first injection site. The third injection was done between the first injection site and the inflection point of the neck.   Will return for repeat injection in 3 months.   A total of 200 units of Botox was prepared, 155 units of Botox was injected as documented above, any Botox not injected was wasted. The patient tolerated the procedure well, there were no complications of the above procedure.

## 2020-02-07 NOTE — Progress Notes (Signed)
Botox- 200 units x 1 vial Lot: [id number] Expiration: 08/2022 NDC: 0223361224   Bacteriostatic 0.9% Sodium Chloride- 61mL total [id number] Expiration: 02/23 NDC: 0211173567    SP Consent on file

## 2020-02-19 DIAGNOSIS — J309 Allergic rhinitis, unspecified: Secondary | ICD-10-CM | POA: Insufficient documentation

## 2020-02-19 DIAGNOSIS — K589 Irritable bowel syndrome without diarrhea: Secondary | ICD-10-CM | POA: Insufficient documentation

## 2020-02-19 DIAGNOSIS — E785 Hyperlipidemia, unspecified: Secondary | ICD-10-CM | POA: Insufficient documentation

## 2020-02-28 ENCOUNTER — Other Ambulatory Visit: Payer: Self-pay

## 2020-02-29 ENCOUNTER — Encounter: Payer: Self-pay | Admitting: Family Medicine

## 2020-02-29 ENCOUNTER — Ambulatory Visit (INDEPENDENT_AMBULATORY_CARE_PROVIDER_SITE_OTHER): Payer: 59 | Admitting: Family Medicine

## 2020-02-29 VITALS — BP 123/81 | HR 71 | Temp 97.7°F | Resp 16 | Ht 68.75 in | Wt 245.8 lb

## 2020-02-29 DIAGNOSIS — M5441 Lumbago with sciatica, right side: Secondary | ICD-10-CM

## 2020-02-29 DIAGNOSIS — F5101 Primary insomnia: Secondary | ICD-10-CM | POA: Diagnosis not present

## 2020-02-29 DIAGNOSIS — G4726 Circadian rhythm sleep disorder, shift work type: Secondary | ICD-10-CM

## 2020-02-29 DIAGNOSIS — M5442 Lumbago with sciatica, left side: Secondary | ICD-10-CM

## 2020-02-29 DIAGNOSIS — G8929 Other chronic pain: Secondary | ICD-10-CM

## 2020-02-29 MED ORDER — TRAZODONE HCL 50 MG PO TABS: ORAL_TABLET | ORAL | 1 refills | Status: DC

## 2020-02-29 MED ORDER — GABAPENTIN 100 MG PO CAPS: 100.0000 mg | ORAL_CAPSULE | Freq: Every day | ORAL | 3 refills | Status: DC

## 2020-02-29 MED ORDER — CYCLOBENZAPRINE HCL 10 MG PO TABS
10.0000 mg | ORAL_TABLET | Freq: Three times a day (TID) | ORAL | 0 refills | Status: DC | PRN
Start: 2020-02-29 — End: 2021-09-29

## 2020-02-29 NOTE — Progress Notes (Signed)
Office Note 02/29/2020  CC:  Chief Complaint  Patient presents with  . Establish Care    Previous PCP, Dr.Hall in [place].     HPI:  Carlos Ford is a 54 y.o. male who is here to establish care Patient's most recent primary MD: Dr. Nevada Crane in [place]. Old records in EPIC/[facility] EMR were reviewed prior to or during today's visit.  Insomnia: on trazodone 50mg  hs x 1-2 yrs---only occ prior to going to 3rd shift a couple months ago.  Worked fairly well historically, No side effect.  Now 1 50mg  tab works sometimes but not consistently--some nights feels no effect at all.  Has never tried higher than 50mg . Has been on other sleep meds in the past but he can't recall any names of meds and says they all caused excessive hangover effect.    Chronic LBP, +Bilat LL's tingling/numb grad over the years, hx of Lumbar DDD.   Pt wants eventually to see specialist regarding this issue but not ready at this time.  Sees Dr. Jaynee Eagles for migraine HA management.   Past Medical History:  Diagnosis Date  . Depression   . GERD (gastroesophageal reflux disease)   . History of adenomatous polyp of colon 2019   Recall 2024  . Hyperlipidemia   . IBS (irritable bowel syndrome)   . Insomnia   . Migraine syndrome    Dr. Jaynee Eagles    Past Surgical History:  Procedure Laterality Date  . COLONOSCOPY N/A 03/07/2018   2019 Adenoma x 1---recall 2024. Procedure: COLONOSCOPY;  Surgeon: Danie Binder, MD;  Location: [facility];  Service: Endoscopy;  Laterality: N/A;  10:30  . ESOPHAGOGASTRODUODENOSCOPY  2005  . LUMBAR EPIDURAL INJECTION  2014   L4-5  . LUMBAR LAMINECTOMY  2014  . MOUTH SURGERY  2003  . POLYPECTOMY  03/07/2018   Procedure: POLYPECTOMY;  Surgeon: Danie Binder, MD;  Location: [facility];  Service: Endoscopy;;  sigmoid    Family History  Problem Relation Age of Onset  . Migraines Mother   . Arthritis Mother   . Diabetes Father   . CAD Father   . Heart attack Father    . Migraines Sister   . Migraines Sister   . Diabetes Daughter   . Cancer Maternal Grandmother   . Early death Maternal Grandfather   . Colon cancer Neg Hx   . Colon polyps Neg Hx     Social History   Socioeconomic History  . Marital status: Married    Spouse name: Not on file  . Number of children: Not on file  . Years of education: Not on file  . Highest education level: Not on file  Occupational History  . Occupation: Naval architect  Tobacco Use  . Smoking status: Never Smoker  . Smokeless tobacco: Former Network engineer  . Vaping Use: Never used  Substance and Sexual Activity  . Alcohol use: Yes    Comment: occasional  . Drug use: No  . Sexual activity: Yes  Other Topics Concern  . Not on file  Social History Narrative   Married, 3 daughters.     Orig from [place], relocated to [facility] 2018.   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   No alc.   No drugs.   Hobby: fishing   Social Determinants of Radio broadcast assistant Strain: Not on file  Food Insecurity: Not on file  Transportation Needs: Not on file  Physical Activity: Not on  file  Stress: Not on file  Social Connections: Not on file  Intimate Partner Violence: Not on file    Outpatient Encounter Medications as of 02/29/2020  Medication Sig  . BOTOX 100 units SOLR injection PROVIDER TO INJECT 155 UNITS INTO THE MUSCLES OF THE HEAD AND NECK EVERY 3 MONTHS. DISCARD REMAINDER.  Marland Kitchen co-enzyme Q-10 50 MG capsule Take 200 mg by mouth daily.  . Cyanocobalamin (VITAMIN B-12 PO) Take by mouth.  . loratadine (CLARITIN) 10 MG tablet Take 10 mg by mouth daily.  . Magnesium 500 MG TABS Take 500 mg by mouth daily.  . Multiple Vitamins-Minerals (ZINC PO) Take 12 mg by mouth.  . naratriptan (AMERGE) 2.5 MG tablet TAKE 1 TAB AT ONSET OF HEADACHE IF RETURNS/ DOES NOT RESOLVE, MAY REPEAT AFTER 2 HRS MAX 5MG  / 24HRS  . prochlorperazine (COMPAZINE) 10 MG tablet Take 1 tablet (10 mg total) by  mouth every 6 (six) hours as needed for nausea or vomiting.  Marland Kitchen VITAMIN D PO Take 10,000 Int'l Units by mouth.  . [DISCONTINUED] gabapentin (NEURONTIN) 100 MG capsule Take 1 capsule (100 mg total) by mouth 2 (two) times daily. (Patient taking differently: Take 100 mg by mouth daily.)  . gabapentin (NEURONTIN) 100 MG capsule Take 1 capsule (100 mg total) by mouth daily.  . traZODone (DESYREL) 50 MG tablet 1-4 tabs po qhs as needed for insomnia  . [DISCONTINUED] zolpidem (AMBIEN) 10 MG tablet Take 1 tablet (10 mg total) by mouth at bedtime as needed for up to 30 days for sleep. (Patient not taking: Reported on 02/29/2020)   No facility-administered encounter medications on file as of 02/29/2020.    Allergies  Allergen Reactions  . Amoxicillin   . Penicillins   . Pseudoephedrine Palpitations  . Sulfa Antibiotics Rash  . Wellbutrin [Bupropion] Nausea Only, Anxiety and Other (See Comments)    Insomnia and paranoia    ROS Review of Systems  Constitutional: Negative for fatigue and fever.  HENT: Negative for congestion and sore throat.   Eyes: Negative for visual disturbance.  Respiratory: Negative for cough.   Cardiovascular: Negative for chest pain.  Gastrointestinal: Negative for abdominal pain and nausea.  Genitourinary: Negative for dysuria.  Musculoskeletal: Negative for back pain and joint swelling.  Skin: Negative for rash.  Neurological: Negative for weakness and headaches.  Hematological: Negative for adenopathy.  Psychiatric/Behavioral: Positive for sleep disturbance. Negative for dysphoric mood. The patient is not nervous/anxious.     PE; Blood pressure 123/81, pulse 71, temperature 97.7 F (36.5 C), temperature source Oral, resp. rate 16, height 5' 8.75" (1.746 m), weight 245 lb 12.8 oz (111.5 kg), SpO2 95 %. Body mass index is 36.56 kg/m.  Gen: Alert, well appearing.  Patient is oriented to person, place, time, and situation. AFFECT: pleasant, lucid thought and  speech. MIW:OEHO: no injection, icteris, swelling, or exudate.  EOMI, PERRLA. Mouth: lips without lesion/swelling.  Oral mucosa pink and moist. Oropharynx without erythema, exudate, or swelling.  CV: RRR, no m/r/g.   LUNGS: CTA bilat, nonlabored resps, good aeration in all lung fields. EXT: no clubbing or cyanosis.  no edema.   Pertinent labs:  NONE in EMR  ASSESSMENT AND PLAN:   New pt;  1) Insomnia/shift work sleep disorder: Inc trazodone to 100mg  qhs prn to start, ok to push to 200mg  dose if needed. Therapeutic expectations and side effect profile of medication discussed today.  Patient's questions answered.  2)  Chronic LBP with radiculopathy: RF'd gabapentin 100mg  qd b/c pt  seems to have done best on this dose long term. Remote hx of L-spine laminectomy/discectomy. Pt wants eventually to see specialist regarding this issue but not ready at this time.  Prev health: He declines vaccines b/c historically they have caused him to have more severe migraines. He'll need prostate ca screening and fasting labs at next visit.  He'll return to f/u insomnia and get cpe with labs in 2 mo.  An After Visit Summary was printed and given to the patient.  Return in about 2 months (around 05/01/2020) for annual CPE (fasting).  Signed:  Crissie Sickles, MD           02/29/2020

## 2020-03-13 ENCOUNTER — Other Ambulatory Visit: Payer: Self-pay | Admitting: Family Medicine

## 2020-03-14 NOTE — Telephone Encounter (Addendum)
Encounter sent in error

## 2020-03-28 ENCOUNTER — Other Ambulatory Visit: Payer: Self-pay | Admitting: Family Medicine

## 2020-03-28 ENCOUNTER — Encounter: Payer: Self-pay | Admitting: Family Medicine

## 2020-03-28 MED ORDER — METHYLPREDNISOLONE 4 MG PO TBPK: ORAL_TABLET | ORAL | 0 refills | Status: DC

## 2020-04-11 DIAGNOSIS — G5762 Lesion of plantar nerve, left lower limb: Secondary | ICD-10-CM | POA: Diagnosis not present

## 2020-04-22 ENCOUNTER — Telehealth: Payer: Self-pay | Admitting: Family Medicine

## 2020-04-22 NOTE — Telephone Encounter (Signed)
Patient has a Botox appointment on 3/2. I called [facility] and spoke with [facility] to check order status. [employer] is no longer active. I called the patient and LVM requesting a call back with updated insurance information.

## 2020-04-24 DIAGNOSIS — Z20822 Contact with and (suspected) exposure to covid-19: Secondary | ICD-10-CM | POA: Diagnosis not present

## 2020-04-24 DIAGNOSIS — M84375A Stress fracture, left foot, initial encounter for fracture: Secondary | ICD-10-CM | POA: Diagnosis not present

## 2020-05-01 ENCOUNTER — Other Ambulatory Visit: Payer: Self-pay

## 2020-05-01 DIAGNOSIS — M84375D Stress fracture, left foot, subsequent encounter for fracture with routine healing: Secondary | ICD-10-CM | POA: Diagnosis not present

## 2020-05-02 ENCOUNTER — Encounter: Payer: Self-pay | Admitting: Family Medicine

## 2020-05-02 ENCOUNTER — Ambulatory Visit (INDEPENDENT_AMBULATORY_CARE_PROVIDER_SITE_OTHER): Payer: BC Managed Care – PPO | Admitting: Family Medicine

## 2020-05-02 VITALS — BP 123/90 | HR 90 | Temp 97.8°F | Resp 16 | Ht 68.75 in | Wt 248.2 lb

## 2020-05-02 DIAGNOSIS — E559 Vitamin D deficiency, unspecified: Secondary | ICD-10-CM

## 2020-05-02 DIAGNOSIS — Z1159 Encounter for screening for other viral diseases: Secondary | ICD-10-CM | POA: Diagnosis not present

## 2020-05-02 DIAGNOSIS — Z1152 Encounter for screening for COVID-19: Secondary | ICD-10-CM | POA: Diagnosis not present

## 2020-05-02 DIAGNOSIS — Z125 Encounter for screening for malignant neoplasm of prostate: Secondary | ICD-10-CM

## 2020-05-02 DIAGNOSIS — Z0000 Encounter for general adult medical examination without abnormal findings: Secondary | ICD-10-CM

## 2020-05-02 DIAGNOSIS — E669 Obesity, unspecified: Secondary | ICD-10-CM | POA: Diagnosis not present

## 2020-05-02 DIAGNOSIS — E66812 Obesity, class 2: Secondary | ICD-10-CM

## 2020-05-02 NOTE — Progress Notes (Signed)
Office Note 05/02/2020  CC:  Chief Complaint  Patient presents with  . Annual Exam    HPI:  Carlos Ford is a 55 y.o. White male who is here for annual health maintenance exam. I saw him 02/29/20 for his establish care visit. A/P as of that visit: "1) Insomnia/shift work sleep disorder: Inc trazodone to 100mg  qhs prn to start, ok to push to 200mg  dose if needed. Therapeutic expectations and side effect profile of medication discussed today.  Patient's questions answered.  2)  Chronic LBP with radiculopathy: RF'd gabapentin 100mg  qd b/c pt seems to have done best on this dose long term. Remote hx of L-spine laminectomy/discectomy. Pt wants eventually to see specialist regarding this issue but not ready at this time.  Prev health: He declines vaccines b/c historically they have caused him to have more severe migraines. He'll need prostate ca screening and fasting labs at next visit.  He'll return to f/u insomnia and get cpe with labs in 2 mo."  INTERIM HX: Doing fine.  Sleep is much better now on inc traz to 100mg  qd.  No home bp monitoring. [employer] regularly. Recent stress fx 5th metatarsal.  Ignored it for a month, has been in walking boot for 10d or so.  Pt states hx of vit D def, was on 5K U qd for a while then 2 yrs ago his dose was inc to 10K U qd.    Past Medical History:  Diagnosis Date  . Depression   . GERD (gastroesophageal reflux disease)   . History of adenomatous polyp of colon 2019   Recall 2024  . Hyperlipidemia   . IBS (irritable bowel syndrome)   . Insomnia   . Migraine syndrome    Dr. Jaynee Eagles    Past Surgical History:  Procedure Laterality Date  . COLONOSCOPY N/A 03/07/2018   2019 Adenoma x 1---recall 2024. Procedure: COLONOSCOPY;  Surgeon: Danie Binder, MD;  Location: [facility];  Service: Endoscopy;  Laterality: N/A;  10:30  . ESOPHAGOGASTRODUODENOSCOPY  2005  . LUMBAR EPIDURAL INJECTION  2014   L4-5  . LUMBAR  LAMINECTOMY  2014  . MOUTH SURGERY  2003  . POLYPECTOMY  03/07/2018   Procedure: POLYPECTOMY;  Surgeon: Danie Binder, MD;  Location: [facility];  Service: Endoscopy;;  sigmoid    Family History  Problem Relation Age of Onset  . Migraines Mother   . Arthritis Mother   . Diabetes Father   . CAD Father   . Heart attack Father   . Migraines Sister   . Migraines Sister   . Diabetes Daughter   . Cancer Maternal Grandmother   . Early death Maternal Grandfather   . Colon cancer Neg Hx   . Colon polyps Neg Hx     Social History   Socioeconomic History  . Marital status: Married    Spouse name: Not on file  . Number of children: Not on file  . Years of education: Not on file  . Highest education level: Not on file  Occupational History  . Occupation: Naval architect  Tobacco Use  . Smoking status: Never Smoker  . Smokeless tobacco: Former Network engineer  . Vaping Use: Never used  Substance and Sexual Activity  . Alcohol use: Yes    Comment: occasional  . Drug use: No  . Sexual activity: Yes  Other Topics Concern  . Not on file  Social History Narrative   Married, 3 daughters.  Orig from [place], relocated to [facility] 2018.   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   No alc.   No drugs.   Hobby: fishing   Social Determinants of Radio broadcast assistant Strain: Not on file  Food Insecurity: Not on file  Transportation Needs: Not on file  Physical Activity: Not on file  Stress: Not on file  Social Connections: Not on file  Intimate Partner Violence: Not on file    Outpatient Medications Prior to Visit  Medication Sig Dispense Refill  . BOTOX 100 units SOLR injection PROVIDER TO INJECT 155 UNITS INTO THE MUSCLES OF THE HEAD AND NECK EVERY 3 MONTHS. DISCARD REMAINDER. 200 Units 1  . co-enzyme Q-10 50 MG capsule Take 200 mg by mouth daily.    . Cyanocobalamin (VITAMIN B-12 PO) Take by mouth.    . cyclobenzaprine (FLEXERIL) 10 MG  tablet Take 1 tablet (10 mg total) by mouth 3 (three) times daily as needed for muscle spasms. 30 tablet 0  . gabapentin (NEURONTIN) 100 MG capsule Take 1 capsule (100 mg total) by mouth daily. 90 capsule 3  . loratadine (CLARITIN) 10 MG tablet Take 10 mg by mouth daily.    . Magnesium 500 MG TABS Take 500 mg by mouth daily.    . Multiple Vitamins-Minerals (ZINC PO) Take 12 mg by mouth.    . naratriptan (AMERGE) 2.5 MG tablet TAKE 1 TAB AT ONSET OF HEADACHE IF RETURNS/ DOES NOT RESOLVE, MAY REPEAT AFTER 2 HRS MAX 5MG  / 24HRS 9 tablet 1  . prochlorperazine (COMPAZINE) 10 MG tablet Take 1 tablet (10 mg total) by mouth every 6 (six) hours as needed for nausea or vomiting. 30 tablet 0  . traZODone (DESYREL) 50 MG tablet 1-4 tabs po qhs as needed for insomnia 60 tablet 1  . VITAMIN D PO Take 10,000 Int'l Units by mouth.    . methylPREDNISolone (MEDROL DOSEPAK) 4 MG TBPK tablet Taper pack as directed 1 each 0   No facility-administered medications prior to visit.    Allergies  Allergen Reactions  . Amoxicillin   . Penicillins   . Pseudoephedrine Palpitations  . Sulfa Antibiotics Rash  . Wellbutrin [Bupropion] Nausea Only, Anxiety and Other (See Comments)    Insomnia and paranoia   ROS Review of Systems  Constitutional: Negative for appetite change, chills, fatigue and fever.  HENT: Negative for congestion, dental problem, ear pain and sore throat.   Eyes: Negative for discharge, redness and visual disturbance.  Respiratory: Negative for cough, chest tightness, shortness of breath and wheezing.   Cardiovascular: Negative for chest pain, palpitations and leg swelling.  Gastrointestinal: Negative for abdominal pain, blood in stool, diarrhea, nausea and vomiting.  Genitourinary: Negative for difficulty urinating, dysuria, flank pain, frequency, hematuria and urgency.  Musculoskeletal: Negative for arthralgias, back pain, joint swelling, myalgias and neck stiffness.  Skin: Negative for pallor  and rash.  Neurological: Negative for dizziness, speech difficulty, weakness and headaches.  Hematological: Negative for adenopathy. Does not bruise/bleed easily.  Psychiatric/Behavioral: Negative for confusion and sleep disturbance. The patient is not nervous/anxious.     PE; Vitals with BMI 05/02/2020 02/29/2020 03/07/2018  Height 5' 8.75" 5' 8.75" -  Weight 248 lbs 3 oz 245 lbs 13 oz -  BMI 46.27 03.50 -  Systolic 093 818 299  Diastolic 90 81 63  Pulse 90 71 68   Gen: Alert, well appearing.  Patient is oriented to person, place, time, and situation. AFFECT: pleasant, lucid thought  and speech. ENT: Ears: EACs clear, normal epithelium.  TMs with good light reflex and landmarks bilaterally.  Eyes: no injection, icteris, swelling, or exudate.  EOMI, PERRLA. Nose: no drainage or turbinate edema/swelling.  No injection or focal lesion.  Mouth: lips without lesion/swelling.  Oral mucosa pink and moist.  Dentition intact and without obvious caries or gingival swelling.  Oropharynx without erythema, exudate, or swelling.  Neck: supple/nontender.  No LAD, mass, or TM.  Carotid pulses 2+ bilaterally, without bruits. CV: RRR, no m/r/g.   LUNGS: CTA bilat, nonlabored resps, good aeration in all lung fields. ABD: soft, NT, ND, BS normal.  No hepatospenomegaly or mass.  No bruits. EXT: no clubbing, cyanosis, or edema.  Musculoskeletal: no joint swelling, erythema, warmth, or tenderness.  ROM of all joints intact. Skin - no sores or suspicious lesions or rashes or color changes   Pertinent labs:  NONE  ASSESSMENT AND PLAN:   1) Insomnia: doing great now on 100mg  trazodone qhs.  2) Vit D def: pt has been on 10K U qd long term. Recheck vit D level today.  3) Health maintenance exam: Reviewed age and gender appropriate health maintenance issues (prudent diet, regular exercise, health risks of tobacco and excessive alcohol, use of seatbelts, fire alarms in home, use of sunscreen).  Also reviewed  age and gender appropriate health screening as well as vaccine recommendations. Vaccines: ALL UTD. Labs: HP + PSA ordered.  Also hep C screening (low risk). Prostate ca screening: PSA ordered. Colon ca screening: polyp on 2019 TCS-->recall 2024.  4) Metatarsal stress fx (L foot 5th toe): pt being followed by podiatrist at [facility]. Continue walking boot.  An After Visit Summary was printed and given to the patient.  FOLLOW UP:  Return in about 1 year (around 05/02/2021) for annual CPE (fasting).  Signed:  Crissie Sickles, MD           05/02/2020

## 2020-05-02 NOTE — Telephone Encounter (Signed)
Patient returned my call and provided me with his new insurance information. Patient now has [employer]. I updated insurance in his chart and advised pt that I will begin working on the prior authorization.

## 2020-05-02 NOTE — Patient Instructions (Signed)

## 2020-05-02 NOTE — Addendum Note (Signed)
Addended by: Octaviano Glow on: 05/02/2020 03:43 PM   Modules accepted: Orders

## 2020-05-03 ENCOUNTER — Encounter: Payer: Self-pay | Admitting: Family Medicine

## 2020-05-03 ENCOUNTER — Telehealth: Payer: Self-pay

## 2020-05-03 DIAGNOSIS — E673 Hypervitaminosis D: Secondary | ICD-10-CM

## 2020-05-03 HISTORY — DX: Hypervitaminosis D: E67.3

## 2020-05-03 LAB — LIPID PANEL
Cholesterol: 273 mg/dL — ABNORMAL HIGH (ref 0–200)
HDL: 46.8 mg/dL (ref >39.00)
NonHDL: 225.89
Total CHOL/HDL Ratio: 6
Triglycerides: 299 mg/dL — ABNORMAL HIGH (ref 0.0–149.0)
VLDL: 59.8 mg/dL — ABNORMAL HIGH (ref 0.0–40.0)

## 2020-05-03 LAB — CBC WITH DIFFERENTIAL/PLATELET
Basophils Absolute: 0.1 10*3/uL (ref 0.0–0.1)
Basophils Relative: 0.6 % (ref 0.0–3.0)
Eosinophils Absolute: 0.1 10*3/uL (ref 0.0–0.7)
Eosinophils Relative: 1.1 % (ref 0.0–5.0)
HCT: 47.7 % (ref 39.0–52.0)
Hemoglobin: 16.4 g/dL (ref 13.0–17.0)
Lymphocytes Relative: 21.4 % (ref 12.0–46.0)
Lymphs Abs: 1.9 10*3/uL (ref 0.7–4.0)
MCHC: 34.3 g/dL (ref 30.0–36.0)
MCV: 83.4 fl (ref 78.0–100.0)
Monocytes Absolute: 0.5 10*3/uL (ref 0.1–1.0)
Monocytes Relative: 6.1 % (ref 3.0–12.0)
Neutro Abs: 6.4 10*3/uL (ref 1.4–7.7)
Neutrophils Relative %: 70.8 % (ref 43.0–77.0)
Platelets: 297 10*3/uL (ref 150.0–400.0)
RBC: 5.72 Mil/uL (ref 4.22–5.81)
RDW: 14.3 % (ref 11.5–15.5)
WBC: 9 10*3/uL (ref 4.0–10.5)

## 2020-05-03 LAB — COMPREHENSIVE METABOLIC PANEL
ALT: 23 U/L (ref 0–53)
AST: 18 U/L (ref 0–37)
Albumin: 4.6 g/dL (ref 3.5–5.2)
Alkaline Phosphatase: 94 U/L (ref 39–117)
BUN: 18 mg/dL (ref 6–23)
CO2: 28 mEq/L (ref 19–32)
Calcium: 9.5 mg/dL (ref 8.4–10.5)
Chloride: 101 mEq/L (ref 96–112)
Creatinine, Ser: 0.98 mg/dL (ref 0.40–1.50)
GFR: 87.7 mL/min (ref >60.00)
Glucose, Bld: 98 mg/dL (ref 70–99)
Potassium: 4.1 mEq/L (ref 3.5–5.1)
Sodium: 138 mEq/L (ref 135–145)
Total Bilirubin: 0.6 mg/dL (ref 0.2–1.2)
Total Protein: 7.6 g/dL (ref 6.0–8.3)

## 2020-05-03 LAB — VITAMIN D 25 HYDROXY (VIT D DEFICIENCY, FRACTURES): VITD: >120 ng/mL

## 2020-05-03 LAB — PSA: PSA: 1.98 ng/mL (ref 0.10–4.00)

## 2020-05-03 LAB — HEPATITIS C ANTIBODY
Hepatitis C Ab: NONREACTIVE
SIGNAL TO CUT-OFF: 0.15 (ref <1.00)

## 2020-05-03 LAB — TSH: TSH: 1.8 u[IU]/mL (ref 0.35–4.50)

## 2020-05-03 LAB — LDL CHOLESTEROL, DIRECT: Direct LDL: 184 mg/dL

## 2020-05-03 MED ORDER — ROSUVASTATIN CALCIUM 10 MG PO TABS: 10.0000 mg | ORAL_TABLET | Freq: Every day | ORAL | 2 refills | Status: DC

## 2020-05-03 NOTE — Addendum Note (Signed)
Addended by: Octaviano Glow on: 05/03/2020 05:14 PM   Modules accepted: Orders

## 2020-05-03 NOTE — Telephone Encounter (Signed)
Noted. See result note attached to all labs from this date.

## 2020-05-03 NOTE — Telephone Encounter (Signed)
CRITICAL VALUE STICKER  CRITICAL VALUE: Vitamin d >120  RECEIVER (on-site recipient of call): Lorriane Shire  DATE & TIME NOTIFIED: 64  MESSENGER (representative from lab): Esperanza Richters  MD NOTIFIED: [place]  TIME OF NOTIFICATION: 6060  RESPONSE:

## 2020-05-04 LAB — SARS-COV-2, NAA 2 DAY TAT

## 2020-05-04 LAB — NOVEL CORONAVIRUS, NAA: SARS-CoV-2, NAA: NOT DETECTED

## 2020-05-09 NOTE — Telephone Encounter (Signed)
I have attempted to call [employer] x3 this week, Tuesday, yesterday & today. They seem to be closed this week because the automated message says "you have reached [place] outside of our normal business hours" each time I have called. Will attempt next week.

## 2020-05-13 NOTE — Telephone Encounter (Addendum)
I called [employer] again this morning. Was still not able to reach anyone for authorization. I called the patient and LVM requesting he call back and provide the number to provider services on his insurance card to verify that I am calling the correct number.

## 2020-05-13 NOTE — Telephone Encounter (Signed)
Patient returned my call. He provided me with a few different phone numbers for [employer]. I called [phone] and spoke with Ellie to check PA requirements for [id number] and [phone]. Ellie states no PA is required, but pre-determination is recommended. She advised me to fax records to [phone] with pending #[id number].

## 2020-05-15 ENCOUNTER — Ambulatory Visit: Payer: 59 | Admitting: Family Medicine

## 2020-05-15 NOTE — Progress Notes (Addendum)
05/16/2020 ALL: He is doing well. Migraines are well managed on Botox. He did not have typical end of cycle worsening of headaches this time. He continues Amerge for abortive therapy.   02/07/2020 ALL: He is doing well. He is now working nights. He does have more headaches toward end of Botox cycle. Amerge helps.   Consent Form Botulism Toxin Injection For Chronic Migraine    Reviewed orally with patient, additionally signature is on file:  Botulism toxin has been approved by the Federal drug administration for treatment of chronic migraine. Botulism toxin does not cure chronic migraine and it may not be effective in some patients.  The administration of botulism toxin is accomplished by injecting a small amount of toxin into the muscles of the neck and head. Dosage must be titrated for each individual. Any benefits resulting from botulism toxin tend to wear off after 3 months with a repeat injection required if benefit is to be maintained. Injections are usually done every 3-4 months with maximum effect peak achieved by about 2 or 3 weeks. Botulism toxin is expensive and you should be sure of what costs you will incur resulting from the injection.  The side effects of botulism toxin use for chronic migraine may include:   -Transient, and usually mild, facial weakness with facial injections  -Transient, and usually mild, head or neck weakness with head/neck injections  -Reduction or loss of forehead facial animation due to forehead muscle weakness  -Eyelid drooping  -Dry eye  -Pain at the site of injection or bruising at the site of injection  -Double vision  -Potential unknown long term risks   Contraindications: You should not have Botox if you are pregnant, nursing, allergic to albumin, have an infection, skin condition, or muscle weakness at the site of the injection, or have myasthenia gravis, Lambert-Eaton syndrome, or ALS.  It is also possible that as with any injection, there  may be an allergic reaction or no effect from the medication. Reduced effectiveness after repeated injections is sometimes seen and rarely infection at the injection site may occur. All care will be taken to prevent these side effects. If therapy is given over a long time, atrophy and wasting in the muscle injected may occur. Occasionally the patient's become refractory to treatment because they develop antibodies to the toxin. In this event, therapy needs to be modified.  I have read the above information and consent to the administration of botulism toxin.    BOTOX PROCEDURE NOTE FOR MIGRAINE HEADACHE  Contraindications and precautions discussed with patient(above). Aseptic procedure was observed and patient tolerated procedure. Procedure performed by Debbora Presto, FNP-C.   The condition has existed for more than 6 months, and pt does not have a diagnosis of ALS, Myasthenia Gravis or Lambert-Eaton Syndrome.  Risks and benefits of injections discussed and pt agrees to proceed with the procedure.  Written consent obtained  These injections are medically necessary. Pt  receives good benefits from these injections. These injections do not cause sedations or hallucinations which the oral therapies may cause.   Description of procedure:  The patient was placed in a sitting position. The standard protocol was used for Botox as follows, with 5 units of Botox injected at each site:  -Procerus muscle, midline injection  -Corrugator muscle, bilateral injection  -Frontalis muscle, bilateral injection, with 2 sites each side, medial injection was performed in the upper one third of the frontalis muscle, in the region vertical from the medial inferior edge of  the superior orbital rim. The lateral injection was again in the upper one third of the forehead vertically above the lateral limbus of the cornea, 1.5 cm lateral to the medial injection site.  -Temporalis muscle injection, 4 sites, bilaterally. The  first injection was 3 cm above the tragus of the ear, second injection site was 1.5 cm to 3 cm up from the first injection site in line with the tragus of the ear. The third injection site was 1.5-3 cm forward between the first 2 injection sites. The fourth injection site was 1.5 cm posterior to the second injection site. 5th site laterally in the temporalis  muscleat the level of the outer canthus.  -Occipitalis muscle injection, 3 sites, bilaterally. The first injection was done one half way between the occipital protuberance and the tip of the mastoid process behind the ear. The second injection site was done lateral and superior to the first, 1 fingerbreadth from the first injection. The third injection site was 1 fingerbreadth superiorly and medially from the first injection site.  -Cervical paraspinal muscle injection, 2 sites, bilaterally. The first injection site was 1 cm from the midline of the cervical spine, 3 cm inferior to the lower border of the occipital protuberance. The second injection site was 1.5 cm superiorly and laterally to the first injection site.  -Trapezius muscle injection was performed at 3 sites, bilaterally. The first injection site was in the upper trapezius muscle halfway between the inflection point of the neck, and the acromion. The second injection site was one half way between the acromion and the first injection site. The third injection was done between the first injection site and the inflection point of the neck.   Will return for repeat injection in 3 months.   A total of 200 units of Botox was prepared, 155 units of Botox was injected as documented above, any Botox not injected was wasted. The patient tolerated the procedure well, there were no complications of the above procedure.  Agree with procedure   Sarina Ill, MD [facility]

## 2020-05-16 ENCOUNTER — Encounter: Payer: Self-pay | Admitting: Family Medicine

## 2020-05-16 ENCOUNTER — Ambulatory Visit: Payer: BC Managed Care – PPO | Admitting: Family Medicine

## 2020-05-16 DIAGNOSIS — G43709 Chronic migraine without aura, not intractable, without status migrainosus: Secondary | ICD-10-CM | POA: Diagnosis not present

## 2020-05-16 NOTE — Progress Notes (Signed)
Botox- 100 units x 2 vials Lot: [id number] Expiration:07/2022 NDC: 8592-9244-62  Bacteriostatic 0.9% Sodium Chloride- 17mL total [id number] Expiration: 2/23 NDC: 57903-833-38  G43.709 B/B

## 2020-05-23 DIAGNOSIS — M84375D Stress fracture, left foot, subsequent encounter for fracture with routine healing: Secondary | ICD-10-CM | POA: Diagnosis not present

## 2020-05-23 NOTE — Telephone Encounter (Signed)
Received fax from [facility] stating that Botox is considered approved and medically necessary for the patient. #[id number] (05/16/20- 03/15/21).

## 2020-06-06 DIAGNOSIS — M84375D Stress fracture, left foot, subsequent encounter for fracture with routine healing: Secondary | ICD-10-CM | POA: Diagnosis not present

## 2020-06-14 ENCOUNTER — Other Ambulatory Visit: Payer: Self-pay | Admitting: Neurology

## 2020-06-20 DIAGNOSIS — M84375D Stress fracture, left foot, subsequent encounter for fracture with routine healing: Secondary | ICD-10-CM | POA: Diagnosis not present

## 2020-08-26 ENCOUNTER — Ambulatory Visit: Payer: BC Managed Care – PPO | Admitting: Family Medicine

## 2020-08-26 DIAGNOSIS — G43709 Chronic migraine without aura, not intractable, without status migrainosus: Secondary | ICD-10-CM

## 2020-08-26 NOTE — Progress Notes (Signed)
08/26/2020 ALL: He continues to do well on Botox. He does not usually have any migraines until about 1-2 weeks prior to next procedure. Amerge works very well for abortive therapy.   05/16/2020 ALL: He is doing well. Migraines are well managed on Botox. He did not have typical end of cycle worsening of headaches this time. He continues Amerge for abortive therapy.   02/07/2020 ALL: He is doing well. He is now working nights. He does have more headaches toward end of Botox cycle. Amerge helps.   Consent Form Botulism Toxin Injection For Chronic Migraine    Reviewed orally with patient, additionally signature is on file:  Botulism toxin has been approved by the Federal drug administration for treatment of chronic migraine. Botulism toxin does not cure chronic migraine and it may not be effective in some patients.  The administration of botulism toxin is accomplished by injecting a small amount of toxin into the muscles of the neck and head. Dosage must be titrated for each individual. Any benefits resulting from botulism toxin tend to wear off after 3 months with a repeat injection required if benefit is to be maintained. Injections are usually done every 3-4 months with maximum effect peak achieved by about 2 or 3 weeks. Botulism toxin is expensive and you should be sure of what costs you will incur resulting from the injection.  The side effects of botulism toxin use for chronic migraine may include:   -Transient, and usually mild, facial weakness with facial injections  -Transient, and usually mild, head or neck weakness with head/neck injections  -Reduction or loss of forehead facial animation due to forehead muscle weakness  -Eyelid drooping  -Dry eye  -Pain at the site of injection or bruising at the site of injection  -Double vision  -Potential unknown long term risks   Contraindications: You should not have Botox if you are pregnant, nursing, allergic to albumin, have an  infection, skin condition, or muscle weakness at the site of the injection, or have myasthenia gravis, Lambert-Eaton syndrome, or ALS.  It is also possible that as with any injection, there may be an allergic reaction or no effect from the medication. Reduced effectiveness after repeated injections is sometimes seen and rarely infection at the injection site may occur. All care will be taken to prevent these side effects. If therapy is given over a long time, atrophy and wasting in the muscle injected may occur. Occasionally the patient's become refractory to treatment because they develop antibodies to the toxin. In this event, therapy needs to be modified.  I have read the above information and consent to the administration of botulism toxin.    BOTOX PROCEDURE NOTE FOR MIGRAINE HEADACHE  Contraindications and precautions discussed with patient(above). Aseptic procedure was observed and patient tolerated procedure. Procedure performed by Debbora Presto, FNP-C.   The condition has existed for more than 6 months, and pt does not have a diagnosis of ALS, Myasthenia Gravis or Lambert-Eaton Syndrome.  Risks and benefits of injections discussed and pt agrees to proceed with the procedure.  Written consent obtained  These injections are medically necessary. Pt  receives good benefits from these injections. These injections do not cause sedations or hallucinations which the oral therapies may cause.   Description of procedure:  The patient was placed in a sitting position. The standard protocol was used for Botox as follows, with 5 units of Botox injected at each site:  -Procerus muscle, midline injection  -Corrugator muscle, bilateral injection  -  Frontalis muscle, bilateral injection, with 2 sites each side, medial injection was performed in the upper one third of the frontalis muscle, in the region vertical from the medial inferior edge of the superior orbital rim. The lateral injection was again in  the upper one third of the forehead vertically above the lateral limbus of the cornea, 1.5 cm lateral to the medial injection site.  -Temporalis muscle injection, 4 sites, bilaterally. The first injection was 3 cm above the tragus of the ear, second injection site was 1.5 cm to 3 cm up from the first injection site in line with the tragus of the ear. The third injection site was 1.5-3 cm forward between the first 2 injection sites. The fourth injection site was 1.5 cm posterior to the second injection site. 5th site laterally in the temporalis  muscleat the level of the outer canthus.  -Occipitalis muscle injection, 3 sites, bilaterally. The first injection was done one half way between the occipital protuberance and the tip of the mastoid process behind the ear. The second injection site was done lateral and superior to the first, 1 fingerbreadth from the first injection. The third injection site was 1 fingerbreadth superiorly and medially from the first injection site.  -Cervical paraspinal muscle injection, 2 sites, bilaterally. The first injection site was 1 cm from the midline of the cervical spine, 3 cm inferior to the lower border of the occipital protuberance. The second injection site was 1.5 cm superiorly and laterally to the first injection site.  -Trapezius muscle injection was performed at 3 sites, bilaterally. The first injection site was in the upper trapezius muscle halfway between the inflection point of the neck, and the acromion. The second injection site was one half way between the acromion and the first injection site. The third injection was done between the first injection site and the inflection point of the neck.   Will return for repeat injection in 3 months.   A total of 200 units of Botox was prepared, 155 units of Botox was injected as documented above, any Botox not injected was wasted. The patient tolerated the procedure well, there were no complications of the above  procedure.  Agree with procedure   Sarina Ill, MD [facility]

## 2020-08-26 NOTE — Progress Notes (Signed)
Botox- 200 units x 1 vial Lot: [id number] Expiration: 02/2023 NDC: 0768-0881-10  Bacteriostatic 0.9% Sodium Chloride- 75mL total Lot: [id number] Expiration: 08/14/2021 NDC: 8592-9244-62  Dx: M63.817 B/B

## 2020-09-04 ENCOUNTER — Other Ambulatory Visit: Payer: Self-pay | Admitting: Neurology

## 2020-09-04 ENCOUNTER — Telehealth: Payer: Self-pay | Admitting: Neurology

## 2020-09-04 MED ORDER — NARATRIPTAN HCL 2.5 MG PO TABS: ORAL_TABLET | ORAL | 1 refills | Status: DC

## 2020-09-04 NOTE — Telephone Encounter (Signed)
Refill was sent for the patient

## 2020-09-04 NOTE — Telephone Encounter (Signed)
Pt's wife, Ayaansh Smail (on [place]) request refill naratriptan (AMERGE) 2.5 MG tablet at [id number]

## 2020-09-19 ENCOUNTER — Encounter: Payer: Self-pay | Admitting: Family Medicine

## 2020-09-20 ENCOUNTER — Other Ambulatory Visit: Payer: Self-pay | Admitting: Neurology

## 2020-09-20 MED ORDER — METHYLPREDNISOLONE 4 MG PO TBPK
ORAL_TABLET | ORAL | 0 refills | Status: DC
Start: 2020-09-20 — End: 2020-11-27

## 2020-09-23 DIAGNOSIS — M9903 Segmental and somatic dysfunction of lumbar region: Secondary | ICD-10-CM | POA: Diagnosis not present

## 2020-09-23 DIAGNOSIS — M5117 Intervertebral disc disorders with radiculopathy, lumbosacral region: Secondary | ICD-10-CM | POA: Diagnosis not present

## 2020-09-23 DIAGNOSIS — M9902 Segmental and somatic dysfunction of thoracic region: Secondary | ICD-10-CM | POA: Diagnosis not present

## 2020-09-23 DIAGNOSIS — M542 Cervicalgia: Secondary | ICD-10-CM | POA: Diagnosis not present

## 2020-09-23 DIAGNOSIS — M9905 Segmental and somatic dysfunction of pelvic region: Secondary | ICD-10-CM | POA: Diagnosis not present

## 2020-09-23 DIAGNOSIS — M546 Pain in thoracic spine: Secondary | ICD-10-CM | POA: Diagnosis not present

## 2020-09-23 DIAGNOSIS — M62838 Other muscle spasm: Secondary | ICD-10-CM | POA: Diagnosis not present

## 2020-09-23 DIAGNOSIS — M2560 Stiffness of unspecified joint, not elsewhere classified: Secondary | ICD-10-CM | POA: Diagnosis not present

## 2020-09-23 DIAGNOSIS — M9901 Segmental and somatic dysfunction of cervical region: Secondary | ICD-10-CM | POA: Diagnosis not present

## 2020-09-24 ENCOUNTER — Ambulatory Visit
Admission: RE | Admit: 2020-09-24 | Discharge: 2020-09-24 | Disposition: A | Discharge status: Home / Self Care | Payer: BC Managed Care – PPO | Source: Ambulatory Visit | Attending: Chiropractic Medicine | Admitting: Chiropractic Medicine

## 2020-09-24 ENCOUNTER — Other Ambulatory Visit: Payer: Self-pay

## 2020-09-24 ENCOUNTER — Other Ambulatory Visit: Payer: Self-pay | Admitting: Chiropractic Medicine

## 2020-09-24 DIAGNOSIS — R200 Anesthesia of skin: Secondary | ICD-10-CM | POA: Diagnosis not present

## 2020-09-24 DIAGNOSIS — M545 Low back pain, unspecified: Secondary | ICD-10-CM | POA: Diagnosis not present

## 2020-09-24 DIAGNOSIS — M542 Cervicalgia: Secondary | ICD-10-CM

## 2020-09-24 DIAGNOSIS — G8929 Other chronic pain: Secondary | ICD-10-CM | POA: Diagnosis not present

## 2020-09-24 DIAGNOSIS — M25512 Pain in left shoulder: Secondary | ICD-10-CM | POA: Diagnosis not present

## 2020-09-24 DIAGNOSIS — M4184 Other forms of scoliosis, thoracic region: Secondary | ICD-10-CM | POA: Diagnosis not present

## 2020-09-24 DIAGNOSIS — M4602 Spinal enthesopathy, cervical region: Secondary | ICD-10-CM | POA: Diagnosis not present

## 2020-09-24 DIAGNOSIS — M47812 Spondylosis without myelopathy or radiculopathy, cervical region: Secondary | ICD-10-CM | POA: Diagnosis not present

## 2020-09-24 DIAGNOSIS — M25511 Pain in right shoulder: Secondary | ICD-10-CM | POA: Diagnosis not present

## 2020-09-25 DIAGNOSIS — M9903 Segmental and somatic dysfunction of lumbar region: Secondary | ICD-10-CM | POA: Diagnosis not present

## 2020-09-25 DIAGNOSIS — R202 Paresthesia of skin: Secondary | ICD-10-CM | POA: Diagnosis not present

## 2020-09-25 DIAGNOSIS — M9901 Segmental and somatic dysfunction of cervical region: Secondary | ICD-10-CM | POA: Diagnosis not present

## 2020-09-25 DIAGNOSIS — M9905 Segmental and somatic dysfunction of pelvic region: Secondary | ICD-10-CM | POA: Diagnosis not present

## 2020-09-25 DIAGNOSIS — M5116 Intervertebral disc disorders with radiculopathy, lumbar region: Secondary | ICD-10-CM | POA: Diagnosis not present

## 2020-09-25 DIAGNOSIS — M5412 Radiculopathy, cervical region: Secondary | ICD-10-CM | POA: Diagnosis not present

## 2020-09-25 DIAGNOSIS — M9902 Segmental and somatic dysfunction of thoracic region: Secondary | ICD-10-CM | POA: Diagnosis not present

## 2020-09-25 DIAGNOSIS — M4696 Unspecified inflammatory spondylopathy, lumbar region: Secondary | ICD-10-CM | POA: Diagnosis not present

## 2020-09-25 DIAGNOSIS — M546 Pain in thoracic spine: Secondary | ICD-10-CM | POA: Diagnosis not present

## 2020-09-29 DIAGNOSIS — R202 Paresthesia of skin: Secondary | ICD-10-CM | POA: Diagnosis not present

## 2020-09-29 DIAGNOSIS — M4696 Unspecified inflammatory spondylopathy, lumbar region: Secondary | ICD-10-CM | POA: Diagnosis not present

## 2020-09-29 DIAGNOSIS — M50122 Cervical disc disorder at C5-C6 level with radiculopathy: Secondary | ICD-10-CM | POA: Diagnosis not present

## 2020-09-29 DIAGNOSIS — M9901 Segmental and somatic dysfunction of cervical region: Secondary | ICD-10-CM | POA: Diagnosis not present

## 2020-09-29 DIAGNOSIS — M9902 Segmental and somatic dysfunction of thoracic region: Secondary | ICD-10-CM | POA: Diagnosis not present

## 2020-09-29 DIAGNOSIS — M9903 Segmental and somatic dysfunction of lumbar region: Secondary | ICD-10-CM | POA: Diagnosis not present

## 2020-09-29 DIAGNOSIS — M5116 Intervertebral disc disorders with radiculopathy, lumbar region: Secondary | ICD-10-CM | POA: Diagnosis not present

## 2020-09-29 DIAGNOSIS — M9905 Segmental and somatic dysfunction of pelvic region: Secondary | ICD-10-CM | POA: Diagnosis not present

## 2020-09-29 DIAGNOSIS — M2560 Stiffness of unspecified joint, not elsewhere classified: Secondary | ICD-10-CM | POA: Diagnosis not present

## 2020-09-29 DIAGNOSIS — M546 Pain in thoracic spine: Secondary | ICD-10-CM | POA: Diagnosis not present

## 2020-10-01 DIAGNOSIS — M9901 Segmental and somatic dysfunction of cervical region: Secondary | ICD-10-CM | POA: Diagnosis not present

## 2020-10-01 DIAGNOSIS — M4696 Unspecified inflammatory spondylopathy, lumbar region: Secondary | ICD-10-CM | POA: Diagnosis not present

## 2020-10-01 DIAGNOSIS — M5116 Intervertebral disc disorders with radiculopathy, lumbar region: Secondary | ICD-10-CM | POA: Diagnosis not present

## 2020-10-01 DIAGNOSIS — M546 Pain in thoracic spine: Secondary | ICD-10-CM | POA: Diagnosis not present

## 2020-10-01 DIAGNOSIS — M9905 Segmental and somatic dysfunction of pelvic region: Secondary | ICD-10-CM | POA: Diagnosis not present

## 2020-10-01 DIAGNOSIS — R202 Paresthesia of skin: Secondary | ICD-10-CM | POA: Diagnosis not present

## 2020-10-01 DIAGNOSIS — M9902 Segmental and somatic dysfunction of thoracic region: Secondary | ICD-10-CM | POA: Diagnosis not present

## 2020-10-01 DIAGNOSIS — M9903 Segmental and somatic dysfunction of lumbar region: Secondary | ICD-10-CM | POA: Diagnosis not present

## 2020-10-01 DIAGNOSIS — M2560 Stiffness of unspecified joint, not elsewhere classified: Secondary | ICD-10-CM | POA: Diagnosis not present

## 2020-10-01 DIAGNOSIS — M50122 Cervical disc disorder at C5-C6 level with radiculopathy: Secondary | ICD-10-CM | POA: Diagnosis not present

## 2020-10-08 DIAGNOSIS — M9901 Segmental and somatic dysfunction of cervical region: Secondary | ICD-10-CM | POA: Diagnosis not present

## 2020-10-08 DIAGNOSIS — M2570 Osteophyte, unspecified joint: Secondary | ICD-10-CM | POA: Diagnosis not present

## 2020-10-08 DIAGNOSIS — M9902 Segmental and somatic dysfunction of thoracic region: Secondary | ICD-10-CM | POA: Diagnosis not present

## 2020-10-08 DIAGNOSIS — M4040 Postural lordosis, site unspecified: Secondary | ICD-10-CM | POA: Diagnosis not present

## 2020-10-08 DIAGNOSIS — M2560 Stiffness of unspecified joint, not elsewhere classified: Secondary | ICD-10-CM | POA: Diagnosis not present

## 2020-10-08 DIAGNOSIS — M546 Pain in thoracic spine: Secondary | ICD-10-CM | POA: Diagnosis not present

## 2020-10-08 DIAGNOSIS — M9903 Segmental and somatic dysfunction of lumbar region: Secondary | ICD-10-CM | POA: Diagnosis not present

## 2020-10-08 DIAGNOSIS — M50122 Cervical disc disorder at C5-C6 level with radiculopathy: Secondary | ICD-10-CM | POA: Diagnosis not present

## 2020-10-08 DIAGNOSIS — M4696 Unspecified inflammatory spondylopathy, lumbar region: Secondary | ICD-10-CM | POA: Diagnosis not present

## 2020-10-08 DIAGNOSIS — M5116 Intervertebral disc disorders with radiculopathy, lumbar region: Secondary | ICD-10-CM | POA: Diagnosis not present

## 2020-10-25 ENCOUNTER — Other Ambulatory Visit: Payer: Self-pay | Admitting: Neurology

## 2020-10-30 ENCOUNTER — Encounter: Payer: Self-pay | Admitting: Family Medicine

## 2020-10-30 ENCOUNTER — Other Ambulatory Visit: Payer: Self-pay

## 2020-10-30 ENCOUNTER — Ambulatory Visit: Payer: BC Managed Care – PPO | Admitting: Family Medicine

## 2020-10-30 VITALS — BP 102/70 | HR 93 | Temp 98.1°F | Resp 16 | Ht 68.75 in | Wt 247.2 lb

## 2020-10-30 DIAGNOSIS — Z9889 Other specified postprocedural states: Secondary | ICD-10-CM | POA: Diagnosis not present

## 2020-10-30 DIAGNOSIS — M5442 Lumbago with sciatica, left side: Secondary | ICD-10-CM

## 2020-10-30 DIAGNOSIS — G8929 Other chronic pain: Secondary | ICD-10-CM

## 2020-10-30 DIAGNOSIS — E673 Hypervitaminosis D: Secondary | ICD-10-CM | POA: Diagnosis not present

## 2020-10-30 MED ORDER — HYDROCODONE-ACETAMINOPHEN 5-325 MG PO TABS: 1.0000 | ORAL_TABLET | Freq: Four times a day (QID) | ORAL | 0 refills | Status: DC | PRN

## 2020-10-30 NOTE — Progress Notes (Signed)
OFFICE VISIT  10/30/2020  CC:  Chief Complaint  Patient presents with   Back Pain    Given rx for gabapentin 02/2020 but has stopped taking this; 1 week ago pain worsened   HPI:    Patient is a 55 y.o. Caucasian male who presents for worsening back pain. I last saw him 6 months ago. A/P as of that visit: "Chronic LBP with radiculopathy: RF'd gabapentin '100mg'$  qd b/c pt seems to have done best on this dose long term. Remote hx of L-spine laminectomy/discectomy. Pt wants eventually to see specialist regarding this issue but not ready at this time."  INTERIM HX: Worsening bilat LBP the last couple weeks, more persistence of L leg radiating pain.  Some bilat upper thighs pain in medial aspect/groin on and off lately as well.  Has chronic saddle anesthesia present since the time of his initial back issue/surgery years ago (2014).  No loss of b/b control.  No leg weakness other than that caused by increased pain in L side.  Steroid dose pack from his neurologist did not help the back any. He does not have a local orthopedist.   He was on gabapentin '100mg'$  qd chronically in the past but of questionable benefit for him and couldn't up the dose b/c intolerant. Ran out a few months back and notes no diff off the med.   Chiropractor Arnette Schaumann) did xrays: DG lumbar spine complete 09/24/20: IMPRESSION: Prior L5 laminectomy. Degenerative disc disease changes L4-L5.  C spine films 09/24/20: "IMPRESSION: Minor degenerative disc disease changes at C5-C6 and C6-C7. Question muscle spasm."  Tspine 09/24/20: minimal scoliosis.  PMPAWARE reviewed today: on 03/27/19, oxycodon #30, Noemi Chapel rx'd this. No red flags.  Past Medical History:  Diagnosis Date   Depression    GERD (gastroesophageal reflux disease)    History of adenomatous polyp of colon 2019   Recall 2024   Hyperlipidemia    rec'd statin 04/2020   Hypervitaminosis D 05/03/2020   oversupplementation (10K U tab qd for a couple  yrs)   IBS (irritable bowel syndrome)    Insomnia    Migraine syndrome    Dr. Jaynee Eagles    Past Surgical History:  Procedure Laterality Date   COLONOSCOPY N/A 03/07/2018   2019 Adenoma x 1---recall 2024. Procedure: COLONOSCOPY;  Surgeon: Danie Binder, MD;  Location: [facility];  Service: Endoscopy;  Laterality: N/A;  10:30   ESOPHAGOGASTRODUODENOSCOPY  2005   LUMBAR EPIDURAL INJECTION  2014   L4-5   LUMBAR LAMINECTOMY  2014   MOUTH SURGERY  2003   POLYPECTOMY  03/07/2018   Procedure: POLYPECTOMY;  Surgeon: Danie Binder, MD;  Location: [facility];  Service: Endoscopy;;  sigmoid    Outpatient Medications Prior to Visit  Medication Sig Dispense Refill   BOTOX 100 units SOLR injection PROVIDER TO INJECT 155 UNITS INTO THE MUSCLES OF THE HEAD AND NECK EVERY 3 MONTHS. DISCARD REMAINDER. 200 Units 1   co-enzyme Q-10 50 MG capsule Take 200 mg by mouth daily.     Cyanocobalamin (VITAMIN B-12 PO) Take by mouth.     cyclobenzaprine (FLEXERIL) 10 MG tablet Take 1 tablet (10 mg total) by mouth 3 (three) times daily as needed for muscle spasms. 30 tablet 0   loratadine (CLARITIN) 10 MG tablet Take 10 mg by mouth daily.     Magnesium 500 MG TABS Take 500 mg by mouth daily.     methylPREDNISolone (MEDROL DOSEPAK) 4 MG TBPK tablet Please take steroid dose  pack as prescribed 21 tablet 0   Multiple Vitamins-Minerals (ZINC PO) Take 12 mg by mouth.     naratriptan (AMERGE) 2.5 MG tablet TAKE ONE (1) TABLET AT ONSET OF HEADACHE IF RETURNS OR DOES NOT RESOLVE, MAY REPEAT AFTER 4 HOURS DO NOT EXCEED FIVE (5) MG IN 24 HOURS. 9 tablet 1   prochlorperazine (COMPAZINE) 10 MG tablet Take 1 tablet (10 mg total) by mouth every 6 (six) hours as needed for nausea or vomiting. 30 tablet 0   traZODone (DESYREL) 50 MG tablet 1-4 tabs po qhs as needed for insomnia 60 tablet 1   gabapentin (NEURONTIN) 100 MG capsule Take 1 capsule (100 mg total) by mouth daily. (Patient not taking: Reported on 10/30/2020) 90  capsule 3   rosuvastatin (CRESTOR) 10 MG tablet Take 1 tablet (10 mg total) by mouth daily. (Patient not taking: Reported on 10/30/2020) 30 tablet 2   VITAMIN D PO Take 10,000 Int'l Units by mouth. (Patient not taking: Reported on 10/30/2020)     No facility-administered medications prior to visit.    Allergies  Allergen Reactions   Amoxicillin    Penicillins    Pseudoephedrine Palpitations   Sulfa Antibiotics Rash   Wellbutrin [Bupropion] Nausea Only, Anxiety and Other (See Comments)    Insomnia and paranoia    ROS As per HPI  PE: Vitals with BMI 10/30/2020 05/02/2020 02/29/2020  Height 5' 8.75" 5' 8.75" 5' 8.75"  Weight 247 lbs 3 oz 248 lbs 3 oz 245 lbs 13 oz  BMI 36.78 [id number]  Systolic [id number]  Diastolic 70 90 81  Pulse 93 90 71    Gen: Alert, well appearing.  Patient is oriented to person, place, time, and situation. AFFECT: pleasant, lucid thought and speech. ROM of L spine is intact. He has TTP around facet joints of entire L spine.  No SI jt tenderness. Sitting SLR on L pos at 10-15 deg.  Legs strength 5/5 prox and dist bilat.  DTR in L patella and achilles absent. 1+ patellar and 1+ achilles DTRs on R.  LABS:    Chemistry      Component Value Date/Time   NA 138 05/02/2020 1539   K 4.1 05/02/2020 1539   CL 101 05/02/2020 1539   CO2 28 05/02/2020 1539   BUN 18 05/02/2020 1539   CREATININE 0.98 05/02/2020 1539      Component Value Date/Time   CALCIUM 9.5 05/02/2020 1539   ALKPHOS 94 05/02/2020 1539   AST 18 05/02/2020 1539   ALT 23 05/02/2020 1539   BILITOT 0.6 05/02/2020 1539     IMPRESSION AND PLAN:  1) Acute-on-chronic LBP with L leg radiculopathy.  He has some sensory deficits that are chronic, DTR absence on L patellar and achilles areas chronic as well. Plan is to refer him for PT, refer to Dr. Nelva Bush with [facility], and start vicodin 5/325, 1-2 q6h prn Stop goodies powder. He just recently had some plain film imaging via his chiropractor  (see HPI) and I don't think any advanced imaging is indicated at this time.  An After Visit Summary was printed and given to the patient.  FOLLOW UP: Return in about 2 weeks (around 11/13/2020) for f/u back pain.  Signed:  Crissie Sickles, MD           10/30/2020

## 2020-10-30 NOTE — Patient Instructions (Signed)
Buy over the counter senakot-S (store brand is fine) and take 2 tabs every night.   Also buy generic miralax powder and take 1 capful 2-3 times per day as needed.

## 2020-10-31 LAB — VITAMIN D 25 HYDROXY (VIT D DEFICIENCY, FRACTURES): VITD: 81.18 ng/mL (ref 30.00–100.00)

## 2020-11-14 DIAGNOSIS — M5459 Other low back pain: Secondary | ICD-10-CM | POA: Diagnosis not present

## 2020-11-19 DIAGNOSIS — M5459 Other low back pain: Secondary | ICD-10-CM | POA: Diagnosis not present

## 2020-11-25 DIAGNOSIS — M545 Low back pain, unspecified: Secondary | ICD-10-CM | POA: Diagnosis not present

## 2020-11-27 ENCOUNTER — Ambulatory Visit: Payer: BC Managed Care – PPO | Admitting: Family Medicine

## 2020-11-27 DIAGNOSIS — G43709 Chronic migraine without aura, not intractable, without status migrainosus: Secondary | ICD-10-CM

## 2020-11-27 NOTE — Progress Notes (Signed)
Botox- 200 units x 1 vial Lot: [id number] Expiration: 02/25 NDC: [id number]  0.9% Sodium Chloride- 65m total Lot: [id number]: 01/24 NDC: [id number] Dx: [id number]

## 2020-11-27 NOTE — Progress Notes (Signed)
11/27/2020 ALL: He returns for Botox. He is doing well. No changes in migraines. He continues Amerge for abortive therapy.   08/26/2020 ALL: He continues to do well on Botox. He does not usually have any migraines until about 1-2 weeks prior to next procedure. Amerge works very well for abortive therapy.   05/16/2020 ALL: He is doing well. Migraines are well managed on Botox. He did not have typical end of cycle worsening of headaches this time. He continues Amerge for abortive therapy.   02/07/2020 ALL: He is doing well. He is now working nights. He does have more headaches toward end of Botox cycle. Amerge helps.   Consent Form Botulism Toxin Injection For Chronic Migraine    Reviewed orally with patient, additionally signature is on file:  Botulism toxin has been approved by the Federal drug administration for treatment of chronic migraine. Botulism toxin does not cure chronic migraine and it may not be effective in some patients.  The administration of botulism toxin is accomplished by injecting a small amount of toxin into the muscles of the neck and head. Dosage must be titrated for each individual. Any benefits resulting from botulism toxin tend to wear off after 3 months with a repeat injection required if benefit is to be maintained. Injections are usually done every 3-4 months with maximum effect peak achieved by about 2 or 3 weeks. Botulism toxin is expensive and you should be sure of what costs you will incur resulting from the injection.  The side effects of botulism toxin use for chronic migraine may include:   -Transient, and usually mild, facial weakness with facial injections  -Transient, and usually mild, head or neck weakness with head/neck injections  -Reduction or loss of forehead facial animation due to forehead muscle weakness  -Eyelid drooping  -Dry eye  -Pain at the site of injection or bruising at the site of injection  -Double vision  -Potential unknown long  term risks   Contraindications: You should not have Botox if you are pregnant, nursing, allergic to albumin, have an infection, skin condition, or muscle weakness at the site of the injection, or have myasthenia gravis, Lambert-Eaton syndrome, or ALS.  It is also possible that as with any injection, there may be an allergic reaction or no effect from the medication. Reduced effectiveness after repeated injections is sometimes seen and rarely infection at the injection site may occur. All care will be taken to prevent these side effects. If therapy is given over a long time, atrophy and wasting in the muscle injected may occur. Occasionally the patient's become refractory to treatment because they develop antibodies to the toxin. In this event, therapy needs to be modified.  I have read the above information and consent to the administration of botulism toxin.    BOTOX PROCEDURE NOTE FOR MIGRAINE HEADACHE  Contraindications and precautions discussed with patient(above). Aseptic procedure was observed and patient tolerated procedure. Procedure performed by Debbora Presto, FNP-C.   The condition has existed for more than 6 months, and pt does not have a diagnosis of ALS, Myasthenia Gravis or Lambert-Eaton Syndrome.  Risks and benefits of injections discussed and pt agrees to proceed with the procedure.  Written consent obtained  These injections are medically necessary. Pt  receives good benefits from these injections. These injections do not cause sedations or hallucinations which the oral therapies may cause.   Description of procedure:  The patient was placed in a sitting position. The standard protocol was used for Botox  as follows, with 5 units of Botox injected at each site:  -Procerus muscle, midline injection  -Corrugator muscle, bilateral injection  -Frontalis muscle, bilateral injection, with 2 sites each side, medial injection was performed in the upper one third of the frontalis  muscle, in the region vertical from the medial inferior edge of the superior orbital rim. The lateral injection was again in the upper one third of the forehead vertically above the lateral limbus of the cornea, 1.5 cm lateral to the medial injection site.  -Temporalis muscle injection, 4 sites, bilaterally. The first injection was 3 cm above the tragus of the ear, second injection site was 1.5 cm to 3 cm up from the first injection site in line with the tragus of the ear. The third injection site was 1.5-3 cm forward between the first 2 injection sites. The fourth injection site was 1.5 cm posterior to the second injection site. 5th site laterally in the temporalis  muscleat the level of the outer canthus.  -Occipitalis muscle injection, 3 sites, bilaterally. The first injection was done one half way between the occipital protuberance and the tip of the mastoid process behind the ear. The second injection site was done lateral and superior to the first, 1 fingerbreadth from the first injection. The third injection site was 1 fingerbreadth superiorly and medially from the first injection site.  -Cervical paraspinal muscle injection, 2 sites, bilaterally. The first injection site was 1 cm from the midline of the cervical spine, 3 cm inferior to the lower border of the occipital protuberance. The second injection site was 1.5 cm superiorly and laterally to the first injection site.  -Trapezius muscle injection was performed at 3 sites, bilaterally. The first injection site was in the upper trapezius muscle halfway between the inflection point of the neck, and the acromion. The second injection site was one half way between the acromion and the first injection site. The third injection was done between the first injection site and the inflection point of the neck.   Will return for repeat injection in 3 months.   A total of 200 units of Botox was prepared, 155 units of Botox was injected as documented  above, any Botox not injected was wasted. The patient tolerated the procedure well, there were no complications of the above procedure.

## 2020-11-28 DIAGNOSIS — R2689 Other abnormalities of gait and mobility: Secondary | ICD-10-CM | POA: Diagnosis not present

## 2020-11-28 DIAGNOSIS — M5459 Other low back pain: Secondary | ICD-10-CM | POA: Diagnosis not present

## 2020-11-28 DIAGNOSIS — M6281 Muscle weakness (generalized): Secondary | ICD-10-CM | POA: Diagnosis not present

## 2020-12-03 ENCOUNTER — Encounter: Payer: Self-pay | Admitting: Family Medicine

## 2020-12-09 DIAGNOSIS — M6281 Muscle weakness (generalized): Secondary | ICD-10-CM | POA: Diagnosis not present

## 2020-12-09 DIAGNOSIS — R2689 Other abnormalities of gait and mobility: Secondary | ICD-10-CM | POA: Diagnosis not present

## 2020-12-09 DIAGNOSIS — M5459 Other low back pain: Secondary | ICD-10-CM | POA: Diagnosis not present

## 2020-12-11 DIAGNOSIS — M7732 Calcaneal spur, left foot: Secondary | ICD-10-CM | POA: Diagnosis not present

## 2020-12-11 DIAGNOSIS — M7731 Calcaneal spur, right foot: Secondary | ICD-10-CM | POA: Diagnosis not present

## 2020-12-11 DIAGNOSIS — M722 Plantar fascial fibromatosis: Secondary | ICD-10-CM | POA: Diagnosis not present

## 2020-12-11 DIAGNOSIS — G5763 Lesion of plantar nerve, bilateral lower limbs: Secondary | ICD-10-CM | POA: Diagnosis not present

## 2020-12-13 DIAGNOSIS — R2689 Other abnormalities of gait and mobility: Secondary | ICD-10-CM | POA: Diagnosis not present

## 2020-12-13 DIAGNOSIS — M5459 Other low back pain: Secondary | ICD-10-CM | POA: Diagnosis not present

## 2020-12-13 DIAGNOSIS — M6281 Muscle weakness (generalized): Secondary | ICD-10-CM | POA: Diagnosis not present

## 2020-12-14 ENCOUNTER — Other Ambulatory Visit: Payer: Self-pay | Admitting: Family Medicine

## 2020-12-16 DIAGNOSIS — M5459 Other low back pain: Secondary | ICD-10-CM | POA: Diagnosis not present

## 2020-12-16 DIAGNOSIS — M6281 Muscle weakness (generalized): Secondary | ICD-10-CM | POA: Diagnosis not present

## 2020-12-16 DIAGNOSIS — R2689 Other abnormalities of gait and mobility: Secondary | ICD-10-CM | POA: Diagnosis not present

## 2020-12-20 DIAGNOSIS — R2689 Other abnormalities of gait and mobility: Secondary | ICD-10-CM | POA: Diagnosis not present

## 2020-12-20 DIAGNOSIS — M5459 Other low back pain: Secondary | ICD-10-CM | POA: Diagnosis not present

## 2020-12-20 DIAGNOSIS — M6281 Muscle weakness (generalized): Secondary | ICD-10-CM | POA: Diagnosis not present

## 2020-12-23 DIAGNOSIS — R2689 Other abnormalities of gait and mobility: Secondary | ICD-10-CM | POA: Diagnosis not present

## 2020-12-23 DIAGNOSIS — M5459 Other low back pain: Secondary | ICD-10-CM | POA: Diagnosis not present

## 2020-12-23 DIAGNOSIS — M6281 Muscle weakness (generalized): Secondary | ICD-10-CM | POA: Diagnosis not present

## 2020-12-24 ENCOUNTER — Other Ambulatory Visit: Payer: Self-pay | Admitting: Family Medicine

## 2020-12-31 DIAGNOSIS — M5416 Radiculopathy, lumbar region: Secondary | ICD-10-CM | POA: Diagnosis not present

## 2021-01-14 ENCOUNTER — Other Ambulatory Visit: Payer: Self-pay

## 2021-01-14 DIAGNOSIS — M545 Low back pain, unspecified: Secondary | ICD-10-CM | POA: Diagnosis not present

## 2021-01-14 MED ORDER — NARATRIPTAN HCL 2.5 MG PO TABS: ORAL_TABLET | ORAL | 5 refills | Status: DC

## 2021-01-28 DIAGNOSIS — M5416 Radiculopathy, lumbar region: Secondary | ICD-10-CM | POA: Diagnosis not present

## 2021-02-03 DIAGNOSIS — M5459 Other low back pain: Secondary | ICD-10-CM | POA: Diagnosis not present

## 2021-02-04 DIAGNOSIS — M5459 Other low back pain: Secondary | ICD-10-CM | POA: Diagnosis not present

## 2021-02-07 DIAGNOSIS — M5459 Other low back pain: Secondary | ICD-10-CM | POA: Diagnosis not present

## 2021-02-10 DIAGNOSIS — M5459 Other low back pain: Secondary | ICD-10-CM | POA: Diagnosis not present

## 2021-02-14 DIAGNOSIS — M5459 Other low back pain: Secondary | ICD-10-CM | POA: Diagnosis not present

## 2021-02-20 DIAGNOSIS — M5459 Other low back pain: Secondary | ICD-10-CM | POA: Diagnosis not present

## 2021-03-03 ENCOUNTER — Ambulatory Visit: Payer: BC Managed Care – PPO | Admitting: Family Medicine

## 2021-03-05 ENCOUNTER — Ambulatory Visit: Payer: BC Managed Care – PPO | Admitting: Family Medicine

## 2021-03-05 ENCOUNTER — Encounter: Payer: Self-pay | Admitting: Family Medicine

## 2021-03-05 DIAGNOSIS — G43709 Chronic migraine without aura, not intractable, without status migrainosus: Secondary | ICD-10-CM

## 2021-03-05 NOTE — Progress Notes (Signed)
Botox- 200 units x 1 vial Lot: [id number]  Expiration: 11/2023 NDC: [id number]  Bacteriostatic 0.9% Sodium Chloride- 4mL total Lot: [id number] Expiration: 10/15/2022 NDC: 0094-1791-99  Dx: P79.009 B/B

## 2021-03-05 NOTE — Progress Notes (Signed)
03/05/2021 ALL: Carlos Ford returns for follow up. He continues to do well. Amerge works well for abortive therapy. He rarely has migraines until 2 weeks prior to next Botox procedure.   11/27/2020 ALL: He returns for Botox. He is doing well. No changes in migraines. He continues Amerge for abortive therapy.   08/26/2020 ALL: He continues to do well on Botox. He does not usually have any migraines until about 1-2 weeks prior to next procedure. Amerge works very well for abortive therapy.   05/16/2020 ALL: He is doing well. Migraines are well managed on Botox. He did not have typical end of cycle worsening of headaches this time. He continues Amerge for abortive therapy.   02/07/2020 ALL: He is doing well. He is now working nights. He does have more headaches toward end of Botox cycle. Amerge helps.   Consent Form Botulism Toxin Injection For Chronic Migraine    Reviewed orally with patient, additionally signature is on file:  Botulism toxin has been approved by the Federal drug administration for treatment of chronic migraine. Botulism toxin does not cure chronic migraine and it may not be effective in some patients.  The administration of botulism toxin is accomplished by injecting a small amount of toxin into the muscles of the neck and head. Dosage must be titrated for each individual. Any benefits resulting from botulism toxin tend to wear off after 3 months with a repeat injection required if benefit is to be maintained. Injections are usually done every 3-4 months with maximum effect peak achieved by about 2 or 3 weeks. Botulism toxin is expensive and you should be sure of what costs you will incur resulting from the injection.  The side effects of botulism toxin use for chronic migraine may include:   -Transient, and usually mild, facial weakness with facial injections  -Transient, and usually mild, head or neck weakness with head/neck injections  -Reduction or loss of forehead facial  animation due to forehead muscle weakness  -Eyelid drooping  -Dry eye  -Pain at the site of injection or bruising at the site of injection  -Double vision  -Potential unknown long term risks   Contraindications: You should not have Botox if you are pregnant, nursing, allergic to albumin, have an infection, skin condition, or muscle weakness at the site of the injection, or have myasthenia gravis, Lambert-Eaton syndrome, or ALS.  It is also possible that as with any injection, there may be an allergic reaction or no effect from the medication. Reduced effectiveness after repeated injections is sometimes seen and rarely infection at the injection site may occur. All care will be taken to prevent these side effects. If therapy is given over a long time, atrophy and wasting in the muscle injected may occur. Occasionally the patient's become refractory to treatment because they develop antibodies to the toxin. In this event, therapy needs to be modified.  I have read the above information and consent to the administration of botulism toxin.    BOTOX PROCEDURE NOTE FOR MIGRAINE HEADACHE  Contraindications and precautions discussed with patient(above). Aseptic procedure was observed and patient tolerated procedure. Procedure performed by Debbora Presto, FNP-C.   The condition has existed for more than 6 months, and pt does not have a diagnosis of ALS, Myasthenia Gravis or Lambert-Eaton Syndrome.  Risks and benefits of injections discussed and pt agrees to proceed with the procedure.  Written consent obtained  These injections are medically necessary. Pt  receives good benefits from these injections. These injections do  not cause sedations or hallucinations which the oral therapies may cause.   Description of procedure:  The patient was placed in a sitting position. The standard protocol was used for Botox as follows, with 5 units of Botox injected at each site:  -Procerus muscle, midline  injection  -Corrugator muscle, bilateral injection  -Frontalis muscle, bilateral injection, with 2 sites each side, medial injection was performed in the upper one third of the frontalis muscle, in the region vertical from the medial inferior edge of the superior orbital rim. The lateral injection was again in the upper one third of the forehead vertically above the lateral limbus of the cornea, 1.5 cm lateral to the medial injection site.  -Temporalis muscle injection, 4 sites, bilaterally. The first injection was 3 cm above the tragus of the ear, second injection site was 1.5 cm to 3 cm up from the first injection site in line with the tragus of the ear. The third injection site was 1.5-3 cm forward between the first 2 injection sites. The fourth injection site was 1.5 cm posterior to the second injection site. 5th site laterally in the temporalis  muscleat the level of the outer canthus.  -Occipitalis muscle injection, 3 sites, bilaterally. The first injection was done one half way between the occipital protuberance and the tip of the mastoid process behind the ear. The second injection site was done lateral and superior to the first, 1 fingerbreadth from the first injection. The third injection site was 1 fingerbreadth superiorly and medially from the first injection site.  -Cervical paraspinal muscle injection, 2 sites, bilaterally. The first injection site was 1 cm from the midline of the cervical spine, 3 cm inferior to the lower border of the occipital protuberance. The second injection site was 1.5 cm superiorly and laterally to the first injection site.  -Trapezius muscle injection was performed at 3 sites, bilaterally. The first injection site was in the upper trapezius muscle halfway between the inflection point of the neck, and the acromion. The second injection site was one half way between the acromion and the first injection site. The third injection was done between the first injection  site and the inflection point of the neck.   Will return for repeat injection in 3 months.   A total of 200 units of Botox was prepared, 155 units of Botox was injected as documented above, any Botox not injected was wasted. The patient tolerated the procedure well, there were no complications of the above procedure.

## 2021-03-13 DIAGNOSIS — M5431 Sciatica, right side: Secondary | ICD-10-CM | POA: Diagnosis not present

## 2021-03-13 DIAGNOSIS — M5432 Sciatica, left side: Secondary | ICD-10-CM | POA: Diagnosis not present

## 2021-03-13 DIAGNOSIS — M961 Postlaminectomy syndrome, not elsewhere classified: Secondary | ICD-10-CM | POA: Diagnosis not present

## 2021-03-13 DIAGNOSIS — M4726 Other spondylosis with radiculopathy, lumbar region: Secondary | ICD-10-CM | POA: Diagnosis not present

## 2021-03-21 DIAGNOSIS — M5459 Other low back pain: Secondary | ICD-10-CM | POA: Diagnosis not present

## 2021-04-03 DIAGNOSIS — M5416 Radiculopathy, lumbar region: Secondary | ICD-10-CM | POA: Diagnosis not present

## 2021-04-03 DIAGNOSIS — M961 Postlaminectomy syndrome, not elsewhere classified: Secondary | ICD-10-CM | POA: Diagnosis not present

## 2021-04-03 DIAGNOSIS — M47816 Spondylosis without myelopathy or radiculopathy, lumbar region: Secondary | ICD-10-CM | POA: Diagnosis not present

## 2021-04-07 ENCOUNTER — Other Ambulatory Visit: Payer: Self-pay | Admitting: Family Medicine

## 2021-04-07 NOTE — Telephone Encounter (Signed)
Request for 90 d/s. Med pending. Please review and advise

## 2021-04-16 DIAGNOSIS — M5416 Radiculopathy, lumbar region: Secondary | ICD-10-CM | POA: Diagnosis not present

## 2021-04-30 ENCOUNTER — Telehealth: Payer: Self-pay | Admitting: Family Medicine

## 2021-04-30 NOTE — Telephone Encounter (Signed)
Faxed signed PA form with OV notes to [employer]. ?

## 2021-04-30 NOTE — Telephone Encounter (Signed)
Completed Botox continuation form, placed in Nurse Pod for signature.

## 2021-05-05 ENCOUNTER — Telehealth: Payer: Self-pay

## 2021-05-05 ENCOUNTER — Encounter: Payer: BC Managed Care – PPO | Admitting: Family Medicine

## 2021-05-05 NOTE — Telephone Encounter (Signed)
Please review and advise.

## 2021-05-05 NOTE — Telephone Encounter (Signed)
Pt was no show for appt:  05/05/21 # occurrence. Fee is waived on first occurrence. (1st) Please mail letter.   Next Appt: N/A I have called patient, left voicemail to call office regarding missed appt & to reschedule CPE.

## 2021-05-05 NOTE — Telephone Encounter (Signed)
FYI. Please see below.   [facility] - Client Nonclinical Telephone Record  [facility] Client [facility] - Client Client Site [facility] - Day Provider Crissie Sickles - MD Contact Type Call Who Is Calling Patient / Member / Family / Caregiver Caller Name Keithen Capo Caller Phone Number [phone] Patient Name Carlos Ford Patient DOB 09-30-1965 Call Type Message Only Information Provided Reason for Call Request to [facility] Appointment Initial Comment Caller states they need to cancel their appointment for tomorrow. Disp. Time Disposition Final User 05/04/2021 3:45:22 PM General Information Provided Yes Carrolyn Meiers Call Closed By: Carrolyn Meiers Transaction Date/Time: 05/04/2021 3:43:24 PM (ET)

## 2021-05-05 NOTE — Progress Notes (Deleted)
Office Note 05/05/2021  CC: No chief complaint on file.   HPI:  Patient is a 56 y.o. male who is here for annual health maintenance exam and f/u insomnia/shiftwork sleep disorder.  Past Medical History:  Diagnosis Date   Chronic low back pain    estab emergeortho 11/2020->DDD/postlaminectomy syndrome   Depression    GERD (gastroesophageal reflux disease)    History of adenomatous polyp of colon 2019   Recall 2024   Hyperlipidemia    rec'd statin 04/2020   Hypervitaminosis D 05/03/2020   oversupplementation (10K U tab qd for a couple yrs)   IBS (irritable bowel syndrome)    Insomnia    Migraine syndrome    Dr. Jaynee Eagles    Past Surgical History:  Procedure Laterality Date   COLONOSCOPY N/A 03/07/2018   2019 Adenoma x 1---recall 2024. Procedure: COLONOSCOPY;  Surgeon: Danie Binder, MD;  Location: [facility];  Service: Endoscopy;  Laterality: N/A;  10:30   ESOPHAGOGASTRODUODENOSCOPY  2005   LUMBAR EPIDURAL INJECTION  2014   L4-5   LUMBAR LAMINECTOMY  2014   MOUTH SURGERY  2003   POLYPECTOMY  03/07/2018   Procedure: POLYPECTOMY;  Surgeon: Danie Binder, MD;  Location: [facility];  Service: Endoscopy;;  sigmoid    Family History  Problem Relation Age of Onset   Migraines Mother    Arthritis Mother    Diabetes Father    CAD Father    Heart attack Father    Migraines Sister    Migraines Sister    Diabetes Daughter    Cancer Maternal Grandmother    Early death Maternal Grandfather    Colon cancer Neg Hx    Colon polyps Neg Hx     Social History   Socioeconomic History   Marital status: Married    Spouse name: Not on file   Number of children: Not on file   Years of education: Not on file   Highest education level: Not on file  Occupational History   Occupation: Naval architect  Tobacco Use   Smoking status: Never   Smokeless tobacco: Former  Scientific laboratory technician Use: Never used  Substance and Sexual Activity   Alcohol use: Yes    Comment:  occasional   Drug use: No   Sexual activity: Yes  Other Topics Concern   Not on file  Social History Narrative   Married, 3 daughters.     Orig from [place], relocated to [facility] 2018.   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   No alc.   No drugs.   Hobby: fishing   Social Determinants of Radio broadcast assistant Strain: Not on file  Food Insecurity: Not on file  Transportation Needs: Not on file  Physical Activity: Not on file  Stress: Not on file  Social Connections: Not on file  Intimate Partner Violence: Not on file    Outpatient Medications Prior to Visit  Medication Sig Dispense Refill   BOTOX 100 units SOLR injection PROVIDER TO INJECT 155 UNITS INTO THE MUSCLES OF THE HEAD AND NECK EVERY 3 MONTHS. DISCARD REMAINDER. 200 Units 1   co-enzyme Q-10 50 MG capsule Take 200 mg by mouth daily.     Cyanocobalamin (VITAMIN B-12 PO) Take by mouth.     cyclobenzaprine (FLEXERIL) 10 MG tablet Take 1 tablet (10 mg total) by mouth 3 (three) times daily as needed for muscle spasms. 30 tablet 0   gabapentin (NEURONTIN) 100 MG  capsule Take 1 capsule (100 mg total) by mouth daily. 90 capsule 3   loratadine (CLARITIN) 10 MG tablet Take 10 mg by mouth daily.     Magnesium 500 MG TABS Take 500 mg by mouth daily.     Multiple Vitamins-Minerals (ZINC PO) Take 12 mg by mouth.     naratriptan (AMERGE) 2.5 MG tablet Take one (1) tablet at onset of headache; if returns or does not resolve, may repeat after 4 hours; do not exceed five (5) mg in 24 hours. 9 tablet 5   prochlorperazine (COMPAZINE) 10 MG tablet Take 1 tablet (10 mg total) by mouth every 6 (six) hours as needed for nausea or vomiting. 30 tablet 0   traZODone (DESYREL) 50 MG tablet TAKE 1-4 TABLETS BY MOUTH AT BEDTIME AS NEEDED FOR INSOMNIA. 360 tablet 1   VITAMIN D PO Take 10,000 Int'l Units by mouth.     No facility-administered medications prior to visit.    Allergies  Allergen Reactions   Amoxicillin     Penicillins    Pseudoephedrine Palpitations   Sulfa Antibiotics Rash   Wellbutrin [Bupropion] Nausea Only, Anxiety and Other (See Comments)    Insomnia and paranoia    ROS *** PE; Vitals with BMI 10/30/2020 05/02/2020 02/29/2020  Height 5' 8.75" 5' 8.75" 5' 8.75"  Weight 247 lbs 3 oz 248 lbs 3 oz 245 lbs 13 oz  BMI 36.78 87.86 76.72  Systolic 094 709 628  Diastolic 70 90 81  Pulse 93 90 71     *** Pertinent labs:  Lab Results  Component Value Date   TSH 1.80 05/02/2020   Lab Results  Component Value Date   WBC 9.0 05/02/2020   HGB 16.4 05/02/2020   HCT 47.7 05/02/2020   MCV 83.4 05/02/2020   PLT 297.0 05/02/2020   Lab Results  Component Value Date   CREATININE 0.98 05/02/2020   BUN 18 05/02/2020   NA 138 05/02/2020   K 4.1 05/02/2020   CL 101 05/02/2020   CO2 28 05/02/2020   Lab Results  Component Value Date   ALT 23 05/02/2020   AST 18 05/02/2020   ALKPHOS 94 05/02/2020   BILITOT 0.6 05/02/2020   Lab Results  Component Value Date   CHOL 273 (H) 05/02/2020   Lab Results  Component Value Date   HDL 46.80 05/02/2020   No results found for: [facility] Lab Results  Component Value Date   TRIG 299.0 (H) 05/02/2020   Lab Results  Component Value Date   CHOLHDL 6 05/02/2020   Lab Results  Component Value Date   PSA 1.98 05/02/2020    ASSESSMENT AND PLAN:   No problem-specific Assessment & Plan notes found for this encounter.  Health maintenance exam: Reviewed age and gender appropriate health maintenance issues (prudent diet, regular exercise, health risks of tobacco and excessive alcohol, use of seatbelts, fire alarms in home, use of sunscreen).  Also reviewed age and gender appropriate health screening as well as vaccine recommendations. Vaccines: Shingrix->***.  Flu->***.  O/w UTD. Labs: HP, Hba1c, and PSA ordered. Prostate ca screening: PSA ordered. Colon ca screening: recall 2024.  An After Visit Summary was printed and given to the  patient.  FOLLOW UP:  No follow-ups on file.  Signed:  Crissie Sickles, MD           05/05/2021

## 2021-05-05 NOTE — Telephone Encounter (Signed)
Noted  

## 2021-05-06 NOTE — Telephone Encounter (Signed)
Please see team health phone note. Pt called 2/19 to cancel appt

## 2021-05-06 NOTE — Telephone Encounter (Signed)
Cancelled appt - pt notified team health the day prior. Cannot charge no show fee.

## 2021-05-06 NOTE — Telephone Encounter (Signed)
Noted, thanks!

## 2021-05-08 DIAGNOSIS — Z6839 Body mass index (BMI) 39.0-39.9, adult: Secondary | ICD-10-CM | POA: Diagnosis not present

## 2021-05-08 DIAGNOSIS — M5416 Radiculopathy, lumbar region: Secondary | ICD-10-CM | POA: Diagnosis not present

## 2021-05-08 DIAGNOSIS — M961 Postlaminectomy syndrome, not elsewhere classified: Secondary | ICD-10-CM | POA: Diagnosis not present

## 2021-06-03 ENCOUNTER — Ambulatory Visit: Payer: BC Managed Care – PPO | Admitting: Family Medicine

## 2021-06-04 NOTE — Progress Notes (Signed)
? ?06/05/2021 ALL: He returns for Botox. He continues to do well. Migraines are rare until the last 2-3 weeks prior to Botox. He continues Amerge as needed.  ? ?03/05/2021 ALL: Kiam returns for follow up. He continues to do well. Amerge works well for abortive therapy. He rarely has migraines until 2 weeks prior to next Botox procedure.  ? ?11/27/2020 ALL: He returns for Botox. He is doing well. No changes in migraines. He continues Amerge for abortive therapy.  ? ?08/26/2020 ALL: He continues to do well on Botox. He does not usually have any migraines until about 1-2 weeks prior to next procedure. Amerge works very well for abortive therapy.  ? ?05/16/2020 ALL: He is doing well. Migraines are well managed on Botox. He did not have typical end of cycle worsening of headaches this time. He continues Amerge for abortive therapy.  ? ?02/07/2020 ALL: He is doing well. He is now working nights. He does have more headaches toward end of Botox cycle. Amerge helps.  ? ?Consent Form ?Botulism Toxin Injection For Chronic Migraine ? ? ? ?Reviewed orally with patient, additionally signature is on file: ? ?Botulism toxin has been approved by the Federal drug administration for treatment of chronic migraine. Botulism toxin does not cure chronic migraine and it may not be effective in some patients. ? ?The administration of botulism toxin is accomplished by injecting a small amount of toxin into the muscles of the neck and head. Dosage must be titrated for each individual. Any benefits resulting from botulism toxin tend to wear off after 3 months with a repeat injection required if benefit is to be maintained. Injections are usually done every 3-4 months with maximum effect peak achieved by about 2 or 3 weeks. Botulism toxin is expensive and you should be sure of what costs you will incur resulting from the injection. ? ?The side effects of botulism toxin use for chronic migraine may include: ? ? -Transient, and usually mild, facial  weakness with facial injections ? -Transient, and usually mild, head or neck weakness with head/neck injections ? -Reduction or loss of forehead facial animation due to forehead muscle weakness ? -Eyelid drooping ? -Dry eye ? -Pain at the site of injection or bruising at the site of injection ? -Double vision ? -Potential unknown long term risks ? ? ?Contraindications: You should not have Botox if you are pregnant, nursing, allergic to albumin, have an infection, skin condition, or muscle weakness at the site of the injection, or have myasthenia gravis, Lambert-Eaton syndrome, or ALS. ? ?It is also possible that as with any injection, there may be an allergic reaction or no effect from the medication. Reduced effectiveness after repeated injections is sometimes seen and rarely infection at the injection site may occur. All care will be taken to prevent these side effects. If therapy is given over a long time, atrophy and wasting in the muscle injected may occur. Occasionally the patient's become refractory to treatment because they develop antibodies to the toxin. In this event, therapy needs to be modified. ? ?I have read the above information and consent to the administration of botulism toxin. ? ? ? ?BOTOX PROCEDURE NOTE FOR MIGRAINE HEADACHE ? ?Contraindications and precautions discussed with patient(above). Aseptic procedure was observed and patient tolerated procedure. Procedure performed by Debbora Presto, FNP-C.  ? ?The condition has existed for more than 6 months, and pt does not have a diagnosis of ALS, Myasthenia Gravis or Lambert-Eaton Syndrome.  Risks and benefits of injections discussed  and pt agrees to proceed with the procedure.  Written consent obtained ? ?These injections are medically necessary. Pt  receives good benefits from these injections. These injections do not cause sedations or hallucinations which the oral therapies may cause. ? ? ?Description of procedure: ? ?The patient was placed in a  sitting position. The standard protocol was used for Botox as follows, with 5 units of Botox injected at each site: ? ?-Procerus muscle, midline injection ? ?-Corrugator muscle, bilateral injection ? ?-Frontalis muscle, bilateral injection, with 2 sites each side, medial injection was performed in the upper one third of the frontalis muscle, in the region vertical from the medial inferior edge of the superior orbital rim. The lateral injection was again in the upper one third of the forehead vertically above the lateral limbus of the cornea, 1.5 cm lateral to the medial injection site. ? ?-Temporalis muscle injection, 4 sites, bilaterally. The first injection was 3 cm above the tragus of the ear, second injection site was 1.5 cm to 3 cm up from the first injection site in line with the tragus of the ear. The third injection site was 1.5-3 cm forward between the first 2 injection sites. The fourth injection site was 1.5 cm posterior to the second injection site. 5th site laterally in the temporalis  muscleat the level of the outer canthus. ? ?-Occipitalis muscle injection, 3 sites, bilaterally. The first injection was done one half way between the occipital protuberance and the tip of the mastoid process behind the ear. The second injection site was done lateral and superior to the first, 1 fingerbreadth from the first injection. The third injection site was 1 fingerbreadth superiorly and medially from the first injection site. ? ?-Cervical paraspinal muscle injection, 2 sites, bilaterally. The first injection site was 1 cm from the midline of the cervical spine, 3 cm inferior to the lower border of the occipital protuberance. The second injection site was 1.5 cm superiorly and laterally to the first injection site. ? ?-Trapezius muscle injection was performed at 3 sites, bilaterally. The first injection site was in the upper trapezius muscle halfway between the inflection point of the neck, and the acromion. The  second injection site was one half way between the acromion and the first injection site. The third injection was done between the first injection site and the inflection point of the neck. ? ? ?Will return for repeat injection in 3 months. ? ? ?A total of 200 units of Botox was prepared, 155 units of Botox was injected as documented above, any Botox not injected was wasted. The patient tolerated the procedure well, there were no complications of the above procedure. ? ? ? ?

## 2021-06-04 NOTE — Telephone Encounter (Signed)
Called [employer] spoke with Freda Munro reference # [id number] she states CPT codes [phone], and [phone] do not require PA. ?

## 2021-06-05 ENCOUNTER — Ambulatory Visit: Payer: BC Managed Care – PPO | Admitting: Family Medicine

## 2021-06-05 DIAGNOSIS — G43709 Chronic migraine without aura, not intractable, without status migrainosus: Secondary | ICD-10-CM | POA: Diagnosis not present

## 2021-06-05 NOTE — Progress Notes (Signed)
Botox- 200 units x 1 vial ?Lot: [id number] ?Expiration: 11/2023 ?[place]: [phone] ? ?Bacteriostatic 0.9% Sodium Chloride- 54m total ?Lot: [id number]?Expiration: 10/15/2022 ?[place] [address]-44? ?Dx: GD39.584?B/B  ?

## 2021-08-20 NOTE — Progress Notes (Signed)
09/01/2021 ALL: He returns for Botox. He continues to do well. Amerge continues to work well for abortive therapy.   06/05/2021 ALL: He returns for Botox. He continues to do well. Migraines are rare until the last 2-3 weeks prior to Botox. He continues Amerge as needed.   03/05/2021 ALL: Raquon returns for follow up. He continues to do well. Amerge works well for abortive therapy. He rarely has migraines until 2 weeks prior to next Botox procedure.   11/27/2020 ALL: He returns for Botox. He is doing well. No changes in migraines. He continues Amerge for abortive therapy.   08/26/2020 ALL: He continues to do well on Botox. He does not usually have any migraines until about 1-2 weeks prior to next procedure. Amerge works very well for abortive therapy.   05/16/2020 ALL: He is doing well. Migraines are well managed on Botox. He did not have typical end of cycle worsening of headaches this time. He continues Amerge for abortive therapy.   02/07/2020 ALL: He is doing well. He is now working nights. He does have more headaches toward end of Botox cycle. Amerge helps.   Consent Form Botulism Toxin Injection For Chronic Migraine    Reviewed orally with patient, additionally signature is on file:  Botulism toxin has been approved by the Federal drug administration for treatment of chronic migraine. Botulism toxin does not cure chronic migraine and it may not be effective in some patients.  The administration of botulism toxin is accomplished by injecting a small amount of toxin into the muscles of the neck and head. Dosage must be titrated for each individual. Any benefits resulting from botulism toxin tend to wear off after 3 months with a repeat injection required if benefit is to be maintained. Injections are usually done every 3-4 months with maximum effect peak achieved by about 2 or 3 weeks. Botulism toxin is expensive and you should be sure of what costs you will incur resulting from the  injection.  The side effects of botulism toxin use for chronic migraine may include:   -Transient, and usually mild, facial weakness with facial injections  -Transient, and usually mild, head or neck weakness with head/neck injections  -Reduction or loss of forehead facial animation due to forehead muscle weakness  -Eyelid drooping  -Dry eye  -Pain at the site of injection or bruising at the site of injection  -Double vision  -Potential unknown long term risks   Contraindications: You should not have Botox if you are pregnant, nursing, allergic to albumin, have an infection, skin condition, or muscle weakness at the site of the injection, or have myasthenia gravis, Lambert-Eaton syndrome, or ALS.  It is also possible that as with any injection, there may be an allergic reaction or no effect from the medication. Reduced effectiveness after repeated injections is sometimes seen and rarely infection at the injection site may occur. All care will be taken to prevent these side effects. If therapy is given over a long time, atrophy and wasting in the muscle injected may occur. Occasionally the patient's become refractory to treatment because they develop antibodies to the toxin. In this event, therapy needs to be modified.  I have read the above information and consent to the administration of botulism toxin.    BOTOX PROCEDURE NOTE FOR MIGRAINE HEADACHE  Contraindications and precautions discussed with patient(above). Aseptic procedure was observed and patient tolerated procedure. Procedure performed by Debbora Presto, FNP-C.   The condition has existed for more than  6 months, and pt does not have a diagnosis of ALS, Myasthenia Gravis or Lambert-Eaton Syndrome.  Risks and benefits of injections discussed and pt agrees to proceed with the procedure.  Written consent obtained  These injections are medically necessary. Pt  receives good benefits from these injections. These injections do not cause  sedations or hallucinations which the oral therapies may cause.   Description of procedure:  The patient was placed in a sitting position. The standard protocol was used for Botox as follows, with 5 units of Botox injected at each site:  -Procerus muscle, midline injection  -Corrugator muscle, bilateral injection  -Frontalis muscle, bilateral injection, with 2 sites each side, medial injection was performed in the upper one third of the frontalis muscle, in the region vertical from the medial inferior edge of the superior orbital rim. The lateral injection was again in the upper one third of the forehead vertically above the lateral limbus of the cornea, 1.5 cm lateral to the medial injection site.  -Temporalis muscle injection, 4 sites, bilaterally. The first injection was 3 cm above the tragus of the ear, second injection site was 1.5 cm to 3 cm up from the first injection site in line with the tragus of the ear. The third injection site was 1.5-3 cm forward between the first 2 injection sites. The fourth injection site was 1.5 cm posterior to the second injection site. 5th site laterally in the temporalis  muscleat the level of the outer canthus.  -Occipitalis muscle injection, 3 sites, bilaterally. The first injection was done one half way between the occipital protuberance and the tip of the mastoid process behind the ear. The second injection site was done lateral and superior to the first, 1 fingerbreadth from the first injection. The third injection site was 1 fingerbreadth superiorly and medially from the first injection site.  -Cervical paraspinal muscle injection, 2 sites, bilaterally. The first injection site was 1 cm from the midline of the cervical spine, 3 cm inferior to the lower border of the occipital protuberance. The second injection site was 1.5 cm superiorly and laterally to the first injection site.  -Trapezius muscle injection was performed at 3 sites, bilaterally. The  first injection site was in the upper trapezius muscle halfway between the inflection point of the neck, and the acromion. The second injection site was one half way between the acromion and the first injection site. The third injection was done between the first injection site and the inflection point of the neck.   Will return for repeat injection in 3 months.   A total of 200 units of Botox was prepared, 155 units of Botox was injected as documented above, any Botox not injected was wasted. The patient tolerated the procedure well, there were no complications of the above procedure.

## 2021-08-26 DIAGNOSIS — M7732 Calcaneal spur, left foot: Secondary | ICD-10-CM | POA: Diagnosis not present

## 2021-09-01 ENCOUNTER — Encounter: Payer: Self-pay | Admitting: Family Medicine

## 2021-09-01 ENCOUNTER — Ambulatory Visit: Payer: BC Managed Care – PPO | Admitting: Family Medicine

## 2021-09-01 VITALS — BP 138/93 | HR 70 | Ht 68.0 in | Wt 285.0 lb

## 2021-09-01 DIAGNOSIS — G43709 Chronic migraine without aura, not intractable, without status migrainosus: Secondary | ICD-10-CM

## 2021-09-01 NOTE — Progress Notes (Signed)
200U vialx1. Lot: [id number]. Exp: 04/2024. [place]: [phone]

## 2021-09-02 DIAGNOSIS — M25572 Pain in left ankle and joints of left foot: Secondary | ICD-10-CM | POA: Diagnosis not present

## 2021-09-10 DIAGNOSIS — M25572 Pain in left ankle and joints of left foot: Secondary | ICD-10-CM | POA: Diagnosis not present

## 2021-09-29 ENCOUNTER — Encounter: Payer: Self-pay | Admitting: Family Medicine

## 2021-09-29 ENCOUNTER — Ambulatory Visit (INDEPENDENT_AMBULATORY_CARE_PROVIDER_SITE_OTHER): Payer: BC Managed Care – PPO | Admitting: Family Medicine

## 2021-09-29 VITALS — BP 124/85 | HR 77 | Temp 97.8°F | Wt 255.0 lb

## 2021-09-29 DIAGNOSIS — Z125 Encounter for screening for malignant neoplasm of prostate: Secondary | ICD-10-CM | POA: Diagnosis not present

## 2021-09-29 DIAGNOSIS — E7800 Pure hypercholesterolemia, unspecified: Secondary | ICD-10-CM

## 2021-09-29 DIAGNOSIS — Z0000 Encounter for general adult medical examination without abnormal findings: Secondary | ICD-10-CM

## 2021-09-29 DIAGNOSIS — E559 Vitamin D deficiency, unspecified: Secondary | ICD-10-CM

## 2021-09-29 MED ORDER — CYCLOBENZAPRINE HCL 10 MG PO TABS: 10.0000 mg | ORAL_TABLET | Freq: Three times a day (TID) | ORAL | 1 refills | Status: AC | PRN

## 2021-09-29 MED ORDER — TRAZODONE HCL 50 MG PO TABS: ORAL_TABLET | ORAL | 3 refills | Status: DC

## 2021-09-29 NOTE — Patient Instructions (Signed)
Health Maintenance, Male Adopting a healthy lifestyle and getting preventive care are important in promoting health and wellness. Ask your health care provider about: The right schedule for you to have regular tests and exams. Things you can do on your own to prevent diseases and keep yourself healthy. What should I know about diet, weight, and exercise? Eat a healthy diet  Eat a diet that includes plenty of vegetables, fruits, low-fat dairy products, and lean protein. Do not eat a lot of foods that are high in solid fats, added sugars, or sodium. Maintain a healthy weight Body mass index (BMI) is a measurement that can be used to identify possible weight problems. It estimates body fat based on height and weight. Your health care provider can help determine your BMI and help you achieve or maintain a healthy weight. Get regular exercise Get regular exercise. This is one of the most important things you can do for your health. Most adults should: Exercise for at least 150 minutes each week. The exercise should increase your heart rate and make you sweat (moderate-intensity exercise). Do strengthening exercises at least twice a week. This is in addition to the moderate-intensity exercise. Spend less time sitting. Even light physical activity can be beneficial. Watch cholesterol and blood lipids Have your blood tested for lipids and cholesterol at 55 years of age, then have this test every 5 years. You may need to have your cholesterol levels checked more often if: Your lipid or cholesterol levels are high. You are older than 55 years of age. You are at high risk for heart disease. What should I know about cancer screening? Many types of cancers can be detected early and may often be prevented. Depending on your health history and family history, you may need to have cancer screening at various ages. This may include screening for: Colorectal cancer. Prostate cancer. Skin cancer. Lung  cancer. What should I know about heart disease, diabetes, and high blood pressure? Blood pressure and heart disease High blood pressure causes heart disease and increases the risk of stroke. This is more likely to develop in people who have high blood pressure readings or are overweight. Talk with your health care provider about your target blood pressure readings. Have your blood pressure checked: Every 3-5 years if you are 18-39 years of age. Every year if you are 40 years old or older. If you are between the ages of 65 and 75 and are a current or former smoker, ask your health care provider if you should have a one-time screening for abdominal aortic aneurysm (AAA). Diabetes Have regular diabetes screenings. This checks your fasting blood sugar level. Have the screening done: Once every three years after age 45 if you are at a normal weight and have a low risk for diabetes. More often and at a younger age if you are overweight or have a high risk for diabetes. What should I know about preventing infection? Hepatitis B If you have a higher risk for hepatitis B, you should be screened for this virus. Talk with your health care provider to find out if you are at risk for hepatitis B infection. Hepatitis C Blood testing is recommended for: Everyone born from 1945 through 1965. Anyone with known risk factors for hepatitis C. Sexually transmitted infections (STIs) You should be screened each year for STIs, including gonorrhea and chlamydia, if: You are sexually active and are younger than 55 years of age. You are older than 55 years of age and your   health care provider tells you that you are at risk for this type of infection. Your sexual activity has changed since you were last screened, and you are at increased risk for chlamydia or gonorrhea. Ask your health care provider if you are at risk. Ask your health care provider about whether you are at high risk for HIV. Your health care provider  may recommend a prescription medicine to help prevent HIV infection. If you choose to take medicine to prevent HIV, you should first get tested for HIV. You should then be tested every 3 months for as long as you are taking the medicine. Follow these instructions at home: Alcohol use Do not drink alcohol if your health care provider tells you not to drink. If you drink alcohol: Limit how much you have to 0-2 drinks a day. Know how much alcohol is in your drink. In the [place] one drink equals one 12 oz bottle of beer (355 mL), one 5 oz glass of wine (148 mL), or one 1 oz glass of hard liquor (44 mL). Lifestyle Do not use any products that contain nicotine or tobacco. These products include cigarettes, chewing tobacco, and vaping devices, such as e-cigarettes. If you need help quitting, ask your health care provider. Do not use street drugs. Do not share needles. Ask your health care provider for help if you need support or information about quitting drugs. General instructions Schedule regular health, dental, and eye exams. Stay current with your vaccines. Tell your health care provider if: You often feel depressed. You have ever been abused or do not feel safe at home. Summary Adopting a healthy lifestyle and getting preventive care are important in promoting health and wellness. Follow your health care provider's instructions about healthy diet, exercising, and getting tested or screened for diseases. Follow your health care provider's instructions on monitoring your cholesterol and blood pressure. This information is not intended to replace advice given to you by your health care provider. Make sure you discuss any questions you have with your health care provider. Document Revised: 07/22/2020 Document Reviewed: 07/22/2020 Elsevier Patient Education  2023 Elsevier Inc.  

## 2021-09-29 NOTE — Progress Notes (Signed)
Office Note 09/29/2021  CC:  Chief Complaint  Patient presents with   Annual Exam    Pt last ate at 7:30.   HPI:  Patient is a 56 y.o. male who is here for annual health maintenance exam.  He is doing well.  Uses trazodone at bedtime, 50 mg. Still using some cyclobenzaprine for his neck and low back pain.  Has had posterior heel pain the last few months. He has an orthopedist who put him in a walking boot for a while but has recently referred him to a foot specialist.  He states this is at [facility]. He says in the last month or so his right heel has been hurting in the same area but not as severe.  Past Medical History:  Diagnosis Date   Chronic low back pain    estab emergeortho 11/2020->DDD/postlaminectomy syndrome   Depression    GERD (gastroesophageal reflux disease)    History of adenomatous polyp of colon 2019   Recall 2024   Hyperlipidemia    rec'd statin 04/2020   Hypervitaminosis D 05/03/2020   oversupplementation (10K U tab qd for a couple yrs)   IBS (irritable bowel syndrome)    Insomnia    Migraine syndrome    Dr. Jaynee Eagles    Past Surgical History:  Procedure Laterality Date   COLONOSCOPY N/A 03/07/2018   2019 Adenoma x 1---recall 2024. Procedure: COLONOSCOPY;  Surgeon: Danie Binder, MD;  Location: [facility];  Service: Endoscopy;  Laterality: N/A;  10:30   ESOPHAGOGASTRODUODENOSCOPY  2005   LUMBAR EPIDURAL INJECTION  2014   L4-5   LUMBAR LAMINECTOMY  2014   MOUTH SURGERY  2003   POLYPECTOMY  03/07/2018   Procedure: POLYPECTOMY;  Surgeon: Danie Binder, MD;  Location: [facility];  Service: Endoscopy;;  sigmoid    Family History  Problem Relation Age of Onset   Migraines Mother    Arthritis Mother    Diabetes Father    CAD Father    Heart attack Father    Migraines Sister    Migraines Sister    Diabetes Daughter    Cancer Maternal Grandmother    Early death Maternal Grandfather    Colon cancer Neg Hx    Colon polyps Neg Hx      Social History   Socioeconomic History   Marital status: Married    Spouse name: Not on file   Number of children: Not on file   Years of education: Not on file   Highest education level: Not on file  Occupational History   Occupation: Naval architect  Tobacco Use   Smoking status: Never   Smokeless tobacco: Former  Scientific laboratory technician Use: Never used  Substance and Sexual Activity   Alcohol use: Yes    Comment: occasional   Drug use: No   Sexual activity: Yes  Other Topics Concern   Not on file  Social History Narrative   Married, 3 daughters.     Orig from [place], relocated to [facility] [place].   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   No alc.   No drugs.   Hobby: fishing   Social Determinants of Radio broadcast assistant Strain: Not on file  Food Insecurity: Not on file  Transportation Needs: Not on file  Physical Activity: Not on file  Stress: Not on file  Social Connections: Not on file  Intimate Partner Violence: Not on file    Outpatient Medications  Prior to Visit  Medication Sig Dispense Refill   BOTOX 100 units SOLR injection PROVIDER TO INJECT 155 UNITS INTO THE MUSCLES OF THE HEAD AND NECK EVERY 3 MONTHS. DISCARD REMAINDER. 200 Units 1   co-enzyme Q-10 50 MG capsule Take 200 mg by mouth daily.     Cyanocobalamin (VITAMIN B-12 PO) Take by mouth.     loratadine (CLARITIN) 10 MG tablet Take 10 mg by mouth daily.     Magnesium 500 MG TABS Take 500 mg by mouth daily.     Multiple Vitamins-Minerals (ZINC PO) Take 12 mg by mouth.     naratriptan (AMERGE) 2.5 MG tablet Take one (1) tablet at onset of headache; if returns or does not resolve, may repeat after 4 hours; do not exceed five (5) mg in 24 hours. 9 tablet 5   prochlorperazine (COMPAZINE) 10 MG tablet Take 1 tablet (10 mg total) by mouth every 6 (six) hours as needed for nausea or vomiting. 30 tablet 0   VITAMIN D PO Take 10,000 Int'l Units by mouth.     VITAMIN K PO Take  by mouth.     cyclobenzaprine (FLEXERIL) 10 MG tablet Take 1 tablet (10 mg total) by mouth 3 (three) times daily as needed for muscle spasms. 30 tablet 0   No facility-administered medications prior to visit.    Allergies  Allergen Reactions   Amoxicillin    Penicillins    Pseudoephedrine Palpitations   Sulfa Antibiotics Rash   Wellbutrin [Bupropion] Nausea Only, Anxiety and Other (See Comments)    Insomnia and paranoia   ROS Review of Systems  Constitutional:  Negative for appetite change, chills, fatigue and fever.  HENT:  Negative for congestion, dental problem, ear pain and sore throat.   Eyes:  Negative for discharge, redness and visual disturbance.  Respiratory:  Negative for cough, chest tightness, shortness of breath and wheezing.   Cardiovascular:  Negative for chest pain, palpitations and leg swelling.  Gastrointestinal:  Negative for abdominal pain, blood in stool, diarrhea, nausea and vomiting.  Genitourinary:  Negative for difficulty urinating, dysuria, flank pain, frequency, hematuria and urgency.  Musculoskeletal:  Negative for arthralgias, back pain, joint swelling, myalgias and neck stiffness.  Skin:  Negative for pallor and rash.  Neurological:  Negative for dizziness, speech difficulty, weakness and headaches.  Hematological:  Negative for adenopathy. Does not bruise/bleed easily.  Psychiatric/Behavioral:  Negative for confusion and sleep disturbance. The patient is not nervous/anxious.    PE;    09/29/2021    3:05 PM 09/01/2021    8:18 AM 10/30/2020    3:28 PM  Vitals with BMI  Height  '5\' 8"'$  5' 8.75"  Weight 255 lbs 285 lbs 247 lbs 3 oz  BMI  63.84 66.59  Systolic 935 701 779  Diastolic 85 93 70  Pulse 77 70 93   Gen: Alert, well appearing.  Patient is oriented to person, place, time, and situation. AFFECT: pleasant, lucid thought and speech. ENT: Ears: EACs clear, normal epithelium.  TMs with good light reflex and landmarks bilaterally.  Eyes: no  injection, icteris, swelling, or exudate.  EOMI, PERRLA. Nose: no drainage or turbinate edema/swelling.  No injection or focal lesion.  Mouth: lips without lesion/swelling.  Oral mucosa pink and moist.  Dentition intact and without obvious caries or gingival swelling.  Oropharynx without erythema, exudate, or swelling.  Neck: supple/nontender.  No LAD, mass, or TM.  Carotid pulses 2+ bilaterally, without bruits. CV: RRR, no m/r/g.   LUNGS: CTA  bilat, nonlabored resps, good aeration in all lung fields. ABD: soft, NT, ND, BS normal.  No hepatospenomegaly or mass.  No bruits. EXT: no clubbing, cyanosis, or edema.  Musculoskeletal: no joint swelling, erythema, warmth, or tenderness.  ROM of all joints intact. Skin - no sores or suspicious lesions or rashes or color changes Left heel with palpable firm bony prominence posteriorly at the insertion of the Achilles tendon he has mild tenderness over this area bilaterally. No increase in his pain with dorsiflexion of the ankle. Negative Thompson test.  Pertinent labs:  Lab Results  Component Value Date   TSH 1.80 05/02/2020   Lab Results  Component Value Date   WBC 9.0 05/02/2020   HGB 16.4 05/02/2020   HCT 47.7 05/02/2020   MCV 83.4 05/02/2020   PLT 297.0 05/02/2020   Lab Results  Component Value Date   CREATININE 0.98 05/02/2020   BUN 18 05/02/2020   NA 138 05/02/2020   K 4.1 05/02/2020   CL 101 05/02/2020   CO2 28 05/02/2020   Lab Results  Component Value Date   ALT 23 05/02/2020   AST 18 05/02/2020   ALKPHOS 94 05/02/2020   BILITOT 0.6 05/02/2020   Lab Results  Component Value Date   CHOL 273 (H) 05/02/2020   Lab Results  Component Value Date   HDL 46.80 05/02/2020   No results found for: "LDLCALC" Lab Results  Component Value Date   TRIG 299.0 (H) 05/02/2020   Lab Results  Component Value Date   CHOLHDL 6 05/02/2020   Lab Results  Component Value Date   PSA 1.98 05/02/2020   ASSESSMENT AND PLAN:   #1  insomnia. Continue trazodone 50 mg at bedtime.  2.  Chronic neck and low back pain. Cyclobenzaprine 10 mg 3 times daily as needed.  #3 bilateral posterior heel pain Haglund deformity palpable on L. He has ortho appt in 2d.  4. Health maintenance exam: Reviewed age and gender appropriate health maintenance issues (prudent diet, regular exercise, health risks of tobacco and excessive alcohol, use of seatbelts, fire alarms in home, use of sunscreen).  Also reviewed age and gender appropriate health screening as well as vaccine recommendations. Vaccines:  Tdap->deferred.  Shingrix->declined. Labs: HP + PSA ordered.   Prostate ca screening: PSA ordered. Colon ca screening: polyp on 2019 TCS-->recall 2024.  An After Visit Summary was printed and given to the patient.  FOLLOW UP:  Return in about 1 year (around 09/30/2022) for annual CPE (fasting).  Signed:  Crissie Sickles, MD           09/29/2021

## 2021-09-30 LAB — CBC
HCT: 45 % (ref 39.0–52.0)
Hemoglobin: 14.9 g/dL (ref 13.0–17.0)
MCHC: 33 g/dL (ref 30.0–36.0)
MCV: 86.5 fl (ref 78.0–100.0)
Platelets: 248 10*3/uL (ref 150.0–400.0)
RBC: 5.21 Mil/uL (ref 4.22–5.81)
RDW: 14.4 % (ref 11.5–15.5)
WBC: 6.2 10*3/uL (ref 4.0–10.5)

## 2021-09-30 LAB — TSH: TSH: 3.03 u[IU]/mL (ref 0.35–5.50)

## 2021-09-30 LAB — COMPREHENSIVE METABOLIC PANEL WITH GFR
ALT: 23 U/L (ref 0–53)
AST: 18 U/L (ref 0–37)
Albumin: 4.5 g/dL (ref 3.5–5.2)
Alkaline Phosphatase: 86 U/L (ref 39–117)
BUN: 20 mg/dL (ref 6–23)
CO2: 26 meq/L (ref 19–32)
Calcium: 9 mg/dL (ref 8.4–10.5)
Chloride: 103 meq/L (ref 96–112)
Creatinine, Ser: 0.98 mg/dL (ref 0.40–1.50)
GFR: 86.84 mL/min
Glucose, Bld: 115 mg/dL — ABNORMAL HIGH (ref 70–99)
Potassium: 4.3 meq/L (ref 3.5–5.1)
Sodium: 139 meq/L (ref 135–145)
Total Bilirubin: 0.5 mg/dL (ref 0.2–1.2)
Total Protein: 7.1 g/dL (ref 6.0–8.3)

## 2021-09-30 LAB — LIPID PANEL
Cholesterol: 236 mg/dL — ABNORMAL HIGH (ref 0–200)
HDL: 41.9 mg/dL (ref >39.00)
NonHDL: 194.01
Total CHOL/HDL Ratio: 6
Triglycerides: 335 mg/dL — ABNORMAL HIGH (ref 0.0–149.0)
VLDL: 67 mg/dL — ABNORMAL HIGH (ref 0.0–40.0)

## 2021-09-30 LAB — VITAMIN D 25 HYDROXY (VIT D DEFICIENCY, FRACTURES): VITD: 55 ng/mL (ref 30.00–100.00)

## 2021-09-30 LAB — LDL CHOLESTEROL, DIRECT: Direct LDL: 151 mg/dL

## 2021-09-30 LAB — PSA: PSA: 2.36 ng/mL (ref 0.10–4.00)

## 2021-10-01 ENCOUNTER — Other Ambulatory Visit (INDEPENDENT_AMBULATORY_CARE_PROVIDER_SITE_OTHER): Payer: BC Managed Care – PPO

## 2021-10-01 DIAGNOSIS — M25572 Pain in left ankle and joints of left foot: Secondary | ICD-10-CM | POA: Diagnosis not present

## 2021-10-01 DIAGNOSIS — R7301 Impaired fasting glucose: Secondary | ICD-10-CM | POA: Diagnosis not present

## 2021-10-01 DIAGNOSIS — M7662 Achilles tendinitis, left leg: Secondary | ICD-10-CM | POA: Diagnosis not present

## 2021-10-01 LAB — HEMOGLOBIN A1C: Hgb A1c MFr Bld: 7.3 % — ABNORMAL HIGH (ref 4.6–6.5)

## 2021-10-06 DIAGNOSIS — M7662 Achilles tendinitis, left leg: Secondary | ICD-10-CM | POA: Diagnosis not present

## 2021-10-06 DIAGNOSIS — M7661 Achilles tendinitis, right leg: Secondary | ICD-10-CM | POA: Diagnosis not present

## 2021-10-09 DIAGNOSIS — M7661 Achilles tendinitis, right leg: Secondary | ICD-10-CM | POA: Diagnosis not present

## 2021-10-09 DIAGNOSIS — M7662 Achilles tendinitis, left leg: Secondary | ICD-10-CM | POA: Diagnosis not present

## 2021-10-13 DIAGNOSIS — M7661 Achilles tendinitis, right leg: Secondary | ICD-10-CM | POA: Diagnosis not present

## 2021-10-13 DIAGNOSIS — M7662 Achilles tendinitis, left leg: Secondary | ICD-10-CM | POA: Diagnosis not present

## 2021-10-15 DIAGNOSIS — M7662 Achilles tendinitis, left leg: Secondary | ICD-10-CM | POA: Diagnosis not present

## 2021-10-15 DIAGNOSIS — M7661 Achilles tendinitis, right leg: Secondary | ICD-10-CM | POA: Diagnosis not present

## 2021-10-16 ENCOUNTER — Other Ambulatory Visit: Payer: Self-pay | Admitting: Family Medicine

## 2021-10-27 DIAGNOSIS — M7662 Achilles tendinitis, left leg: Secondary | ICD-10-CM | POA: Diagnosis not present

## 2021-10-27 DIAGNOSIS — M7661 Achilles tendinitis, right leg: Secondary | ICD-10-CM | POA: Diagnosis not present

## 2021-10-29 DIAGNOSIS — M7661 Achilles tendinitis, right leg: Secondary | ICD-10-CM | POA: Diagnosis not present

## 2021-10-29 DIAGNOSIS — M7662 Achilles tendinitis, left leg: Secondary | ICD-10-CM | POA: Diagnosis not present

## 2021-11-03 DIAGNOSIS — M7662 Achilles tendinitis, left leg: Secondary | ICD-10-CM | POA: Diagnosis not present

## 2021-11-03 DIAGNOSIS — M7661 Achilles tendinitis, right leg: Secondary | ICD-10-CM | POA: Diagnosis not present

## 2021-11-06 DIAGNOSIS — M7662 Achilles tendinitis, left leg: Secondary | ICD-10-CM | POA: Diagnosis not present

## 2021-11-06 DIAGNOSIS — M7661 Achilles tendinitis, right leg: Secondary | ICD-10-CM | POA: Diagnosis not present

## 2021-11-20 NOTE — Progress Notes (Unsigned)
11/24/2021 ALL: he returns for Botox. He continues to do well. He has taken Amerge twice over the past 12 weeks. It continues to work well for abortive therapy.   09/01/2021 ALL: He returns for Botox. He continues to do well. Amerge continues to work well for abortive therapy.   06/05/2021 ALL: He returns for Botox. He continues to do well. Migraines are rare until the last 2-3 weeks prior to Botox. He continues Amerge as needed.   03/05/2021 ALL: Carlos Ford returns for follow up. He continues to do well. Amerge works well for abortive therapy. He rarely has migraines until 2 weeks prior to next Botox procedure.   11/27/2020 ALL: He returns for Botox. He is doing well. No changes in migraines. He continues Amerge for abortive therapy.   08/26/2020 ALL: He continues to do well on Botox. He does not usually have any migraines until about 1-2 weeks prior to next procedure. Amerge works very well for abortive therapy.   05/16/2020 ALL: He is doing well. Migraines are well managed on Botox. He did not have typical end of cycle worsening of headaches this time. He continues Amerge for abortive therapy.   02/07/2020 ALL: He is doing well. He is now working nights. He does have more headaches toward end of Botox cycle. Amerge helps.   Consent Form Botulism Toxin Injection For Chronic Migraine    Reviewed orally with patient, additionally signature is on file:  Botulism toxin has been approved by the Federal drug administration for treatment of chronic migraine. Botulism toxin does not cure chronic migraine and it may not be effective in some patients.  The administration of botulism toxin is accomplished by injecting a small amount of toxin into the muscles of the neck and head. Dosage must be titrated for each individual. Any benefits resulting from botulism toxin tend to wear off after 3 months with a repeat injection required if benefit is to be maintained. Injections are usually done every 3-4 months  with maximum effect peak achieved by about 2 or 3 weeks. Botulism toxin is expensive and you should be sure of what costs you will incur resulting from the injection.  The side effects of botulism toxin use for chronic migraine may include:   -Transient, and usually mild, facial weakness with facial injections  -Transient, and usually mild, head or neck weakness with head/neck injections  -Reduction or loss of forehead facial animation due to forehead muscle weakness  -Eyelid drooping  -Dry eye  -Pain at the site of injection or bruising at the site of injection  -Double vision  -Potential unknown long term risks   Contraindications: You should not have Botox if you are pregnant, nursing, allergic to albumin, have an infection, skin condition, or muscle weakness at the site of the injection, or have myasthenia gravis, Lambert-Eaton syndrome, or ALS.  It is also possible that as with any injection, there may be an allergic reaction or no effect from the medication. Reduced effectiveness after repeated injections is sometimes seen and rarely infection at the injection site may occur. All care will be taken to prevent these side effects. If therapy is given over a long time, atrophy and wasting in the muscle injected may occur. Occasionally the patient's become refractory to treatment because they develop antibodies to the toxin. In this event, therapy needs to be modified.  I have read the above information and consent to the administration of botulism toxin.    BOTOX PROCEDURE NOTE FOR MIGRAINE HEADACHE  Contraindications and precautions discussed with patient(above). Aseptic procedure was observed and patient tolerated procedure. Procedure performed by Debbora Presto, FNP-C.   The condition has existed for more than 6 months, and pt does not have a diagnosis of ALS, Myasthenia Gravis or Lambert-Eaton Syndrome.  Risks and benefits of injections discussed and pt agrees to proceed with the  procedure.  Written consent obtained  These injections are medically necessary. Pt  receives good benefits from these injections. These injections do not cause sedations or hallucinations which the oral therapies may cause.   Description of procedure:  The patient was placed in a sitting position. The standard protocol was used for Botox as follows, with 5 units of Botox injected at each site:  -Procerus muscle, midline injection  -Corrugator muscle, bilateral injection  -Frontalis muscle, bilateral injection, with 2 sites each side, medial injection was performed in the upper one third of the frontalis muscle, in the region vertical from the medial inferior edge of the superior orbital rim. The lateral injection was again in the upper one third of the forehead vertically above the lateral limbus of the cornea, 1.5 cm lateral to the medial injection site.  -Temporalis muscle injection, 4 sites, bilaterally. The first injection was 3 cm above the tragus of the ear, second injection site was 1.5 cm to 3 cm up from the first injection site in line with the tragus of the ear. The third injection site was 1.5-3 cm forward between the first 2 injection sites. The fourth injection site was 1.5 cm posterior to the second injection site. 5th site laterally in the temporalis  muscleat the level of the outer canthus.  -Occipitalis muscle injection, 3 sites, bilaterally. The first injection was done one half way between the occipital protuberance and the tip of the mastoid process behind the ear. The second injection site was done lateral and superior to the first, 1 fingerbreadth from the first injection. The third injection site was 1 fingerbreadth superiorly and medially from the first injection site.  -Cervical paraspinal muscle injection, 2 sites, bilaterally. The first injection site was 1 cm from the midline of the cervical spine, 3 cm inferior to the lower border of the occipital protuberance. The  second injection site was 1.5 cm superiorly and laterally to the first injection site.  -Trapezius muscle injection was performed at 3 sites, bilaterally. The first injection site was in the upper trapezius muscle halfway between the inflection point of the neck, and the acromion. The second injection site was one half way between the acromion and the first injection site. The third injection was done between the first injection site and the inflection point of the neck.   Will return for repeat injection in 3 months.   A total of 200 units of Botox was prepared, 155 units of Botox was injected as documented above, any Botox not injected was wasted. The patient tolerated the procedure well, there were no complications of the above procedure.

## 2021-11-24 ENCOUNTER — Ambulatory Visit: Payer: BC Managed Care – PPO | Admitting: Family Medicine

## 2021-11-24 ENCOUNTER — Encounter: Payer: Self-pay | Admitting: Family Medicine

## 2021-11-24 VITALS — BP 120/82 | HR 68 | Ht 68.0 in | Wt 256.8 lb

## 2021-11-24 VITALS — BP 122/82 | HR 69 | Temp 97.9°F | Ht 68.0 in | Wt 255.4 lb

## 2021-11-24 DIAGNOSIS — M238X9 Other internal derangements of unspecified knee: Secondary | ICD-10-CM | POA: Diagnosis not present

## 2021-11-24 DIAGNOSIS — M79671 Pain in right foot: Secondary | ICD-10-CM | POA: Diagnosis not present

## 2021-11-24 DIAGNOSIS — G43709 Chronic migraine without aura, not intractable, without status migrainosus: Secondary | ICD-10-CM

## 2021-11-24 DIAGNOSIS — M79672 Pain in left foot: Secondary | ICD-10-CM | POA: Diagnosis not present

## 2021-11-24 MED ORDER — ONABOTULINUMTOXINA 200 UNITS IJ SOLR
155.0000 [IU] | Freq: Once | INTRAMUSCULAR | Status: AC
  Administered 2021-11-24: 155 [IU] via INTRAMUSCULAR

## 2021-11-24 NOTE — Progress Notes (Signed)
OFFICE VISIT  11/24/2021  CC:  Chief Complaint  Patient presents with   Heel pain    Pt finished PT last week but does not feel like there has been much improvement. He would like to know next steps    HPI:    Patient is a 56 y.o. male who presents for heel pain. I last saw him for this about 2 months ago. A/P as of last visit: "bilateral posterior heel pain Haglund deformity palpable on L. He has ortho appt in 2d."  INTERIM HX: Chronic bilateral posterior heel pain.  He saw orthopedist, was told he needed PT so he has been doing this for 6 weeks. This has significantly helped his right heel pain, minimally helped his left heel. He has had a heel lift in each shoe and at PT has been getting some gait training as well. Takes ibuprofen 800 mg twice a day on his workdays.   Past Medical History:  Diagnosis Date   Chronic low back pain    estab emergeortho 11/2020->DDD/postlaminectomy syndrome   Depression    GERD (gastroesophageal reflux disease)    History of adenomatous polyp of colon 2019   Recall 2024   Hyperlipidemia    rec'd statin 04/2020   Hypervitaminosis D 05/03/2020   oversupplementation (10K U tab qd for a couple yrs)   IBS (irritable bowel syndrome)    Insomnia    Migraine syndrome    Dr. Jaynee Eagles    Past Surgical History:  Procedure Laterality Date   COLONOSCOPY N/A 03/07/2018   2019 Adenoma x 1---recall 2024. Procedure: COLONOSCOPY;  Surgeon: Danie Binder, MD;  [facility];  Service: Endoscopy;  Laterality: N/A;  10:30   ESOPHAGOGASTRODUODENOSCOPY  2005   LUMBAR EPIDURAL INJECTION  2014   L4-5   LUMBAR LAMINECTOMY  2014   MOUTH SURGERY  2003   POLYPECTOMY  03/07/2018   Procedure: POLYPECTOMY;  Surgeon: Danie Binder, MD;  [facility];  Service: Endoscopy;;  sigmoid    Outpatient Medications Prior to Visit  Medication Sig Dispense Refill   BOTOX 100 units SOLR injection PROVIDER TO INJECT 155 UNITS INTO THE MUSCLES OF THE  HEAD AND NECK EVERY 3 MONTHS. DISCARD REMAINDER. 200 Units 1   co-enzyme Q-10 50 MG capsule Take 200 mg by mouth daily.     Cyanocobalamin (VITAMIN B-12 PO) Take by mouth.     cyclobenzaprine (FLEXERIL) 10 MG tablet Take 1 tablet (10 mg total) by mouth 3 (three) times daily as needed for muscle spasms. 90 tablet 1   loratadine (CLARITIN) 10 MG tablet Take 10 mg by mouth daily.     Magnesium 500 MG TABS Take 500 mg by mouth daily.     Multiple Vitamins-Minerals (ZINC PO) Take 12 mg by mouth.     naratriptan (AMERGE) 2.5 MG tablet Take one (1) tablet at onset of headache; if returns or does not resolve, may repeat after 4 hours; do not exceed five (5) mg in 24 hours. 9 tablet 5   prochlorperazine (COMPAZINE) 10 MG tablet Take 1 tablet (10 mg total) by mouth every 6 (six) hours as needed for nausea or vomiting. 30 tablet 0   traZODone (DESYREL) 50 MG tablet 1 tab po qhs prn insomnia 90 tablet 3   VITAMIN D PO Take 10,000 Int'l Units by mouth.     VITAMIN K PO Take by mouth.     No facility-administered medications prior to visit.    Allergies  Allergen  Reactions   Amoxicillin    Penicillins    Pseudoephedrine Palpitations   Sulfa Antibiotics Rash   Wellbutrin [Bupropion] Nausea Only, Anxiety and Other (See Comments)    Insomnia and paranoia    ROS As per HPI  PE:    11/24/2021   10:27 AM 11/24/2021    8:14 AM 09/29/2021    3:05 PM  Vitals with BMI  Height '5\' 8"'$  '5\' 8"'$    Weight 255 lbs 6 oz 256 lbs 13 oz 255 lbs  BMI 37.85 88.50   Systolic 277 412 878  Diastolic 82 82 85  Pulse 69 68 77     Physical Exam  Gen: Alert, well appearing.  Patient is oriented to person, place, time, and situation. Slight Haglund deformity left heel.  He has mild tenderness to palpation over the Achilles insertion bilaterally.  No erythema or swelling.  Range of motion of the ankle intact, minimal pain with resisted plantar flexion bilaterally. Normal ankle and foot position when standing.  Normal  arches.   LABS:  Last CBC Lab Results  Component Value Date   WBC 6.2 09/29/2021   HGB 14.9 09/29/2021   HCT 45.0 09/29/2021   MCV 86.5 09/29/2021   RDW 14.4 09/29/2021   PLT 248.0 67/67/2094   Last metabolic panel Lab Results  Component Value Date   GLUCOSE 115 (H) 09/29/2021   NA 139 09/29/2021   K 4.3 09/29/2021   CL 103 09/29/2021   CO2 26 09/29/2021   BUN 20 09/29/2021   CREATININE 0.98 09/29/2021   CALCIUM 9.0 09/29/2021   PROT 7.1 09/29/2021   ALBUMIN 4.5 09/29/2021   BILITOT 0.5 09/29/2021   ALKPHOS 86 09/29/2021   AST 18 09/29/2021   ALT 23 09/29/2021   IMPRESSION AND PLAN:  Chronic bilateral posterior heel pain.  Haglund deformity on the left. Slight improvement with physical therapy x6 weeks, heel lift inserts, and ibuprofen 800 twice daily. He is wondering what further can be done.  He understands surgery is a last resort and states he does not really want this option if it is at all avoidable. I do not know if ultrasound treatments would be helpful. Also, custom orthotics may be considered. Will refer to sports medicine MD for further evaluation and recommendations.  An After Visit Summary was printed and given to the patient.  FOLLOW UP: No follow-ups on file.  Signed:  Crissie Sickles, MD           11/24/2021

## 2021-11-27 ENCOUNTER — Other Ambulatory Visit: Payer: Self-pay

## 2021-11-27 MED ORDER — NARATRIPTAN HCL 2.5 MG PO TABS: ORAL_TABLET | ORAL | 2 refills | Status: DC

## 2021-12-01 ENCOUNTER — Encounter: Payer: Self-pay | Admitting: Family Medicine

## 2021-12-01 ENCOUNTER — Ambulatory Visit: Payer: Self-pay

## 2021-12-01 ENCOUNTER — Ambulatory Visit: Payer: BC Managed Care – PPO | Admitting: Family Medicine

## 2021-12-01 VITALS — BP 115/76 | Ht 68.0 in | Wt 255.0 lb

## 2021-12-01 DIAGNOSIS — M6528 Calcific tendinitis, other site: Secondary | ICD-10-CM

## 2021-12-01 DIAGNOSIS — M7660 Achilles tendinitis, unspecified leg: Secondary | ICD-10-CM

## 2021-12-01 NOTE — Assessment & Plan Note (Signed)
Acutely occurring.  Calcific changes at the insertion.  Has a history of migraine so avoiding nitroglycerin patches.  Has completed a course of physical therapy -Counseled on home exercise therapy and supportive care. -Continue heel lifts -Counseled on compression. -Pursue shockwave therapy. -Could consider lab work or injection.

## 2021-12-01 NOTE — Patient Instructions (Signed)
Nice to meet you Please try the exercises  Please try to keep your steps around 10,000  Please continue the heel lifts and orthotics  Please send me a message in MyChart with any questions or updates.  Please see me back to start shockwave therapy .   --Dr. Raeford Razor

## 2021-12-01 NOTE — Progress Notes (Signed)
  Carlos Ford - 56 y.o. male MRN [id number]  Date of birth: 04/30/1965  SUBJECTIVE:  Including CC & ROS.  No chief complaint on file.   Per Carlos Ford is a 56 y.o. male that is presenting with acute heel pain.  The pain is at the insertion of the Achilles tendon.  He has completed physical therapy.  Tends to have pain at the end of the day.    Review of Systems See HPI   HISTORY: Past Medical, Surgical, Social, and Family History Reviewed & Updated per EMR.   Pertinent Historical Findings include:  Past Medical History:  Diagnosis Date   Chronic low back pain    estab emergeortho 11/2020->DDD/postlaminectomy syndrome   Depression    GERD (gastroesophageal reflux disease)    History of adenomatous polyp of colon 2019   Recall 2024   Hyperlipidemia    rec'd statin 04/2020   Hypervitaminosis D 05/03/2020   oversupplementation (10K U tab qd for a couple yrs)   IBS (irritable bowel syndrome)    Insomnia    Migraine syndrome    Dr. Jaynee Eagles    Past Surgical History:  Procedure Laterality Date   COLONOSCOPY N/A 03/07/2018   2019 Adenoma x 1---recall 2024. Procedure: COLONOSCOPY;  Surgeon: Danie Binder, MD;  [facility];  Service: Endoscopy;  Laterality: N/A;  10:30   ESOPHAGOGASTRODUODENOSCOPY  2005   LUMBAR EPIDURAL INJECTION  2014   L4-5   LUMBAR LAMINECTOMY  2014   MOUTH SURGERY  2003   POLYPECTOMY  03/07/2018   Procedure: POLYPECTOMY;  Surgeon: Danie Binder, MD;  [facility];  Service: Endoscopy;;  sigmoid     PHYSICAL EXAM:  VS: BP 115/76 (BP Location: Left Arm, Patient Position: Sitting)   Ht '5\' 8"'$  (1.727 m)   Wt 255 lb (115.7 kg)   BMI 38.77 kg/m  Physical Exam Gen: NAD, alert, cooperative with exam, well-appearing MSK:  Neurovascularly intact    Limited ultrasound: Left heel:  Normal appearance of the mid belly of the Achilles tendon. No retrocalcaneal bursitis. Calcific changes and heel spurring at the  insertion of the Achilles tendon.  No hyperemia today.  Summary: Calcific Achilles tendinitis  Ultrasound and interpretation by Clearance Coots, MD    ASSESSMENT & PLAN:   Calcific Achilles tendinitis of both lower extremities Acutely occurring.  Calcific changes at the insertion.  Has a history of migraine so avoiding nitroglycerin patches.  Has completed a course of physical therapy -Counseled on home exercise therapy and supportive care. -Continue heel lifts -Counseled on compression. -Pursue shockwave therapy. -Could consider lab work or injection.

## 2021-12-08 ENCOUNTER — Ambulatory Visit (INDEPENDENT_AMBULATORY_CARE_PROVIDER_SITE_OTHER): Payer: Self-pay | Admitting: Family Medicine

## 2021-12-08 ENCOUNTER — Encounter: Payer: Self-pay | Admitting: Family Medicine

## 2021-12-08 DIAGNOSIS — M6528 Calcific tendinitis, other site: Secondary | ICD-10-CM

## 2021-12-08 NOTE — Assessment & Plan Note (Signed)
Completed shockwave therapy  

## 2021-12-08 NOTE — Progress Notes (Signed)
  Carlos Ford - 56 y.o. male MRN [id number]  Date of birth: [birth year]  SUBJECTIVE:  Including CC & ROS.  No chief complaint on file.   Carlos Ford is a 56 y.o. male that is here for shockwave therapy.   Review of Systems See HPI   HISTORY: Past Medical, Surgical, Social, and Family History Reviewed & Updated per EMR.   Pertinent Historical Findings include:  Past Medical History:  Diagnosis Date   Chronic low back pain    estab emergeortho 11/2020->DDD/postlaminectomy syndrome   Depression    GERD (gastroesophageal reflux disease)    History of adenomatous polyp of colon 2019   Recall 2024   Hyperlipidemia    rec'd statin 04/2020   Hypervitaminosis D 05/03/2020   oversupplementation (10K U tab qd for a couple yrs)   IBS (irritable bowel syndrome)    Insomnia    Migraine syndrome    Dr. Jaynee Eagles    Past Surgical History:  Procedure Laterality Date   COLONOSCOPY N/A 03/07/2018   2019 Adenoma x 1---recall 2024. Procedure: COLONOSCOPY;  Surgeon: Danie Binder, MD;  [facility];  Service: Endoscopy;  Laterality: N/A;  10:30   ESOPHAGOGASTRODUODENOSCOPY  2005   LUMBAR EPIDURAL INJECTION  2014   L4-5   LUMBAR LAMINECTOMY  2014   MOUTH SURGERY  2003   POLYPECTOMY  03/07/2018   Procedure: POLYPECTOMY;  Surgeon: Danie Binder, MD;  [facility];  Service: Endoscopy;;  sigmoid     PHYSICAL EXAM:  VS: Ht '5\' 8"'$  (1.727 m)   Wt 255 lb (115.7 kg)   BMI 38.77 kg/m  Physical Exam Gen: NAD, alert, cooperative with exam, well-appearing MSK:  Neurovascularly intact    ECSWT Note Carlos Ford [facility] 06-14-1965  Procedure: ECSWT Indications: right and left heel pain  Procedure Details Consent: Risks of procedure as well as the alternatives and risks of each were explained to the (patient/caregiver).  Consent for procedure obtained. Time Out: Verified patient identification, verified procedure, site/side was marked, verified  correct patient position, special equipment/implants available, medications/allergies/relevent history reviewed, required imaging and test results available.  Performed.  The area was cleaned with iodine and alcohol swabs.    The right and left heel was targeted for Extracorporeal shockwave therapy.   Preset: achillodynia Power Level: 40 Frequency: 10 Impulse/cycles: 2000 Head size: medium  Session: 1  Patient did tolerate procedure well.    ASSESSMENT & PLAN:   Calcific Achilles tendinitis of both lower extremities Completed shockwave therapy

## 2021-12-15 ENCOUNTER — Ambulatory Visit (INDEPENDENT_AMBULATORY_CARE_PROVIDER_SITE_OTHER): Payer: Self-pay | Admitting: Family Medicine

## 2021-12-15 DIAGNOSIS — M6528 Calcific tendinitis, other site: Secondary | ICD-10-CM

## 2021-12-15 NOTE — Assessment & Plan Note (Signed)
Completed shockwave therapy  

## 2021-12-15 NOTE — Progress Notes (Signed)
  Carlos Ford - 56 y.o. male MRN [id number]  Date of birth: 04-03-1965  SUBJECTIVE:  Including CC & ROS.  No chief complaint on file.   Carlos Ford is a 56 y.o. male that is  here for shockwave therapy.    Review of Systems See HPI   HISTORY: Past Medical, Surgical, Social, and Family History Reviewed & Updated per EMR.   Pertinent Historical Findings include:  Past Medical History:  Diagnosis Date   Chronic low back pain    estab emergeortho 11/2020->DDD/postlaminectomy syndrome   Depression    GERD (gastroesophageal reflux disease)    History of adenomatous polyp of colon 2019   Recall 2024   Hyperlipidemia    rec'd statin 04/2020   Hypervitaminosis D 05/03/2020   oversupplementation (10K U tab qd for a couple yrs)   IBS (irritable bowel syndrome)    Insomnia    Migraine syndrome    Dr. Jaynee Eagles    Past Surgical History:  Procedure Laterality Date   COLONOSCOPY N/A 03/07/2018   2019 Adenoma x 1---recall 2024. Procedure: COLONOSCOPY;  Surgeon: Danie Binder, MD;  [facility];  Service: Endoscopy;  Laterality: N/A;  10:30   ESOPHAGOGASTRODUODENOSCOPY  2005   LUMBAR EPIDURAL INJECTION  2014   L4-5   LUMBAR LAMINECTOMY  2014   MOUTH SURGERY  2003   POLYPECTOMY  03/07/2018   Procedure: POLYPECTOMY;  Surgeon: Danie Binder, MD;  [facility];  Service: Endoscopy;;  sigmoid     PHYSICAL EXAM:  VS: Ht '5\' 8"'$  (1.727 m)   Wt 255 lb (115.7 kg)   BMI 38.77 kg/m  Physical Exam Gen: NAD, alert, cooperative with exam, well-appearing MSK:  Neurovascularly intact    ECSWT Note Ollie Esty [facility] 1965/12/30  Procedure: ECSWT Indications: right and left heel pain  Procedure Details Consent: Risks of procedure as well as the alternatives and risks of each were explained to the (patient/caregiver).  Consent for procedure obtained. Time Out: Verified patient identification, verified procedure, site/side was marked, verified  correct patient position, special equipment/implants available, medications/allergies/relevent history reviewed, required imaging and test results available.  Performed.  The area was cleaned with iodine and alcohol swabs.    The right and left heel was targeted for Extracorporeal shockwave therapy.   Preset: achillodynia Power Level: 40 Frequency: 10 Impulse/cycles: 2000 Head size: medium  Session: 2  Patient did tolerate procedure well.    ASSESSMENT & PLAN:   Calcific Achilles tendinitis of both lower extremities Completed shockwave therapy

## 2021-12-22 ENCOUNTER — Ambulatory Visit (INDEPENDENT_AMBULATORY_CARE_PROVIDER_SITE_OTHER): Payer: Self-pay | Admitting: Family Medicine

## 2021-12-22 ENCOUNTER — Encounter: Payer: Self-pay | Admitting: Family Medicine

## 2021-12-22 DIAGNOSIS — M6528 Calcific tendinitis, other site: Secondary | ICD-10-CM

## 2021-12-22 NOTE — Progress Notes (Signed)
  Carlos Ford - 56 y.o. male MRN [id number]  Date of birth: September 03, 1965  SUBJECTIVE:  Including CC & ROS.  No chief complaint on file.   Carlos Ford is a 56 y.o. male that is  here for shockwave therapy.    Review of Systems See HPI   HISTORY: Past Medical, Surgical, Social, and Family History Reviewed & Updated per EMR.   Pertinent Historical Findings include:  Past Medical History:  Diagnosis Date   Chronic low back pain    estab emergeortho 11/2020->DDD/postlaminectomy syndrome   Depression    GERD (gastroesophageal reflux disease)    History of adenomatous polyp of colon 2019   Recall 2024   Hyperlipidemia    rec'd statin 04/2020   Hypervitaminosis D 05/03/2020   oversupplementation (10K U tab qd for a couple yrs)   IBS (irritable bowel syndrome)    Insomnia    Migraine syndrome    Dr. Jaynee Eagles    Past Surgical History:  Procedure Laterality Date   COLONOSCOPY N/A 03/07/2018   2019 Adenoma x 1---recall 2024. Procedure: COLONOSCOPY;  Surgeon: Danie Binder, MD;  [facility];  Service: Endoscopy;  Laterality: N/A;  10:30   ESOPHAGOGASTRODUODENOSCOPY  2005   LUMBAR EPIDURAL INJECTION  2014   L4-5   LUMBAR LAMINECTOMY  2014   MOUTH SURGERY  2003   POLYPECTOMY  03/07/2018   Procedure: POLYPECTOMY;  Surgeon: Danie Binder, MD;  [facility];  Service: Endoscopy;;  sigmoid     PHYSICAL EXAM:  VS: Ht '5\' 8"'$  (1.727 m)   Wt 255 lb (115.7 kg)   BMI 38.77 kg/m  Physical Exam Gen: NAD, alert, cooperative with exam, well-appearing MSK: Neurovascularly intact    ECSWT Note Carlos Ford [facility] Jul 27, 1965  Procedure: ECSWT Indications: right and left heel  Procedure Details Consent: Risks of procedure as well as the alternatives and risks of each were explained to the (patient/caregiver).  Consent for procedure obtained. Time Out: Verified patient identification, verified procedure, site/side was marked, verified correct  patient position, special equipment/implants available, medications/allergies/relevent history reviewed, required imaging and test results available.  Performed.  The area was cleaned with iodine and alcohol swabs.    The right and left heel was targeted for Extracorporeal shockwave therapy.   Preset: achillodynia Power Level: 50 Frequency: 10 Impulse/cycles: 2000 Head size: medium  Session: 3  Patient did tolerate procedure well.    ASSESSMENT & PLAN:   Calcific Achilles tendinitis of both lower extremities Completed shockwave therapy

## 2021-12-22 NOTE — Assessment & Plan Note (Signed)
Completed shockwave therapy  

## 2021-12-29 ENCOUNTER — Encounter: Payer: Self-pay | Admitting: Family Medicine

## 2021-12-29 ENCOUNTER — Ambulatory Visit (INDEPENDENT_AMBULATORY_CARE_PROVIDER_SITE_OTHER): Payer: Self-pay | Admitting: Family Medicine

## 2021-12-29 DIAGNOSIS — M7661 Achilles tendinitis, right leg: Secondary | ICD-10-CM

## 2021-12-29 DIAGNOSIS — M6528 Calcific tendinitis, other site: Secondary | ICD-10-CM

## 2021-12-29 NOTE — Assessment & Plan Note (Signed)
Completed shockwave therapy  

## 2021-12-29 NOTE — Progress Notes (Signed)
  Carlos Ford - 56 y.o. male MRN [id number]  Date of birth: Oct 07, 1965  SUBJECTIVE:  Including CC & ROS.  No chief complaint on file.   Carlos Ford is a 56 y.o. male that is  here for shockwave therapy.     Review of Systems See HPI   HISTORY: Past Medical, Surgical, Social, and Family History Reviewed & Updated per EMR.   Pertinent Historical Findings include:  Past Medical History:  Diagnosis Date   Chronic low back pain    estab emergeortho 11/2020->DDD/postlaminectomy syndrome   Depression    GERD (gastroesophageal reflux disease)    History of adenomatous polyp of colon 2019   Recall 2024   Hyperlipidemia    rec'd statin 04/2020   Hypervitaminosis D 05/03/2020   oversupplementation (10K U tab qd for a couple yrs)   IBS (irritable bowel syndrome)    Insomnia    Migraine syndrome    Dr. Jaynee Eagles    Past Surgical History:  Procedure Laterality Date   COLONOSCOPY N/A 03/07/2018   2019 Adenoma x 1---recall 2024. Procedure: COLONOSCOPY;  Surgeon: Danie Binder, MD;  [facility];  Service: Endoscopy;  Laterality: N/A;  10:30   ESOPHAGOGASTRODUODENOSCOPY  2005   LUMBAR EPIDURAL INJECTION  2014   L4-5   LUMBAR LAMINECTOMY  2014   MOUTH SURGERY  2003   POLYPECTOMY  03/07/2018   Procedure: POLYPECTOMY;  Surgeon: Danie Binder, MD;  [facility];  Service: Endoscopy;;  sigmoid     PHYSICAL EXAM:  VS: Ht '5\' 8"'$  (1.727 m)   Wt 255 lb (115.7 kg)   BMI 38.77 kg/m  Physical Exam Gen: NAD, alert, cooperative with exam, well-appearing MSK:  Neurovascularly intact    ECSWT Note Coury Grieger [facility] 1966/02/14  Procedure: ECSWT Indications: right and left heel pain  Procedure Details Consent: Risks of procedure as well as the alternatives and risks of each were explained to the (patient/caregiver).  Consent for procedure obtained. Time Out: Verified patient identification, verified procedure, site/side was marked, verified  correct patient position, special equipment/implants available, medications/allergies/relevent history reviewed, required imaging and test results available.  Performed.  The area was cleaned with iodine and alcohol swabs.    The right and left heel was targeted for Extracorporeal shockwave therapy.   Preset: achillodynia Power Level: 60 Frequency: 10 Impulse/cycles: 2100 Head size: medium  Session: 4  Patient did tolerate procedure well.    ASSESSMENT & PLAN:   Calcific Achilles tendinitis of both lower extremities Completed shockwave therapy

## 2022-01-05 ENCOUNTER — Ambulatory Visit: Payer: Self-pay | Admitting: Family Medicine

## 2022-01-05 ENCOUNTER — Encounter: Payer: Self-pay | Admitting: Family Medicine

## 2022-01-05 DIAGNOSIS — M6528 Calcific tendinitis, other site: Secondary | ICD-10-CM

## 2022-01-05 NOTE — Progress Notes (Signed)
  Carlos Ford - 56 y.o. male MRN [id number]  Date of birth: 09/25/1965  SUBJECTIVE:  Including CC & ROS.  No chief complaint on file.   Carlos Ford is a 56 y.o. male that is  here for shockwave therapy.    Review of Systems See HPI   HISTORY: Past Medical, Surgical, Social, and Family History Reviewed & Updated per EMR.   Pertinent Historical Findings include:  Past Medical History:  Diagnosis Date   Chronic low back pain    estab emergeortho 11/2020->DDD/postlaminectomy syndrome   Depression    GERD (gastroesophageal reflux disease)    History of adenomatous polyp of colon 2019   Recall 2024   Hyperlipidemia    rec'd statin 04/2020   Hypervitaminosis D 05/03/2020   oversupplementation (10K U tab qd for a couple yrs)   IBS (irritable bowel syndrome)    Insomnia    Migraine syndrome    Dr. Jaynee Eagles    Past Surgical History:  Procedure Laterality Date   COLONOSCOPY N/A 03/07/2018   2019 Adenoma x 1---recall 2024. Procedure: COLONOSCOPY;  Surgeon: Danie Binder, MD;  [facility];  Service: Endoscopy;  Laterality: N/A;  10:30   ESOPHAGOGASTRODUODENOSCOPY  2005   LUMBAR EPIDURAL INJECTION  2014   L4-5   LUMBAR LAMINECTOMY  2014   MOUTH SURGERY  2003   POLYPECTOMY  03/07/2018   Procedure: POLYPECTOMY;  Surgeon: Danie Binder, MD;  [facility];  Service: Endoscopy;;  sigmoid     PHYSICAL EXAM:  VS: Ht '5\' 8"'$  (1.727 m)   Wt 255 lb (115.7 kg)   BMI 38.77 kg/m  Physical Exam Gen: NAD, alert, cooperative with exam, well-appearing MSK:  Neurovascularly intact    ECSWT Note Carlos Ford [facility] May 06, 1965  Procedure: ECSWT Indications: right and left heel pain  Procedure Details Consent: Risks of procedure as well as the alternatives and risks of each were explained to the (patient/caregiver).  Consent for procedure obtained. Time Out: Verified patient identification, verified procedure, site/side was marked, verified  correct patient position, special equipment/implants available, medications/allergies/relevent history reviewed, required imaging and test results available.  Performed.  The area was cleaned with iodine and alcohol swabs.    The right and left heel was targeted for Extracorporeal shockwave therapy.   Preset: achillodynia Power Level: 70 Frequency: 8 Impulse/cycles: 2700 Head size: medium  Session: 5  Patient did tolerate procedure well.    ASSESSMENT & PLAN:   Calcific Achilles tendinitis of both lower extremities Completed shockwave therapy

## 2022-01-05 NOTE — Assessment & Plan Note (Signed)
Completed shockwave therapy  

## 2022-01-27 ENCOUNTER — Ambulatory Visit: Payer: BC Managed Care – PPO | Admitting: Family Medicine

## 2022-02-02 ENCOUNTER — Ambulatory Visit: Payer: Self-pay | Admitting: Family Medicine

## 2022-02-02 VITALS — Ht 68.0 in

## 2022-02-02 DIAGNOSIS — M6528 Calcific tendinitis, other site: Secondary | ICD-10-CM

## 2022-02-02 NOTE — Progress Notes (Signed)
  Carlos Ford - 56 y.o. male MRN [id number]  Date of birth: 02-Oct-1965  SUBJECTIVE:  Including CC & ROS.  No chief complaint on file.   Carlos Ford is a 56 y.o. male that is  here for shockwave therapy.    Review of Systems See HPI   HISTORY: Past Medical, Surgical, Social, and Family History Reviewed & Updated per EMR.   Pertinent Historical Findings include:  Past Medical History:  Diagnosis Date   Chronic low back pain    estab emergeortho 11/2020->DDD/postlaminectomy syndrome   Depression    GERD (gastroesophageal reflux disease)    History of adenomatous polyp of colon 2019   Recall 2024   Hyperlipidemia    rec'd statin 04/2020   Hypervitaminosis D 05/03/2020   oversupplementation (10K U tab qd for a couple yrs)   IBS (irritable bowel syndrome)    Insomnia    Migraine syndrome    Dr. Jaynee Eagles    Past Surgical History:  Procedure Laterality Date   COLONOSCOPY N/A 03/07/2018   2019 Adenoma x 1---recall 2024. Procedure: COLONOSCOPY;  Surgeon: Danie Binder, MD;  [facility];  Service: Endoscopy;  Laterality: N/A;  10:30   ESOPHAGOGASTRODUODENOSCOPY  2005   LUMBAR EPIDURAL INJECTION  2014   L4-5   LUMBAR LAMINECTOMY  2014   MOUTH SURGERY  2003   POLYPECTOMY  03/07/2018   Procedure: POLYPECTOMY;  Surgeon: Danie Binder, MD;  [facility];  Service: Endoscopy;;  sigmoid     PHYSICAL EXAM:  VS: Ht '5\' 8"'$  (1.727 m)   BMI 38.77 kg/m  Physical Exam Gen: NAD, alert, cooperative with exam, well-appearing MSK:  Neurovascularly intact    ECSWT Note [name] [facility] 1965/07/09  Procedure: ECSWT Indications: right and left heel pain  Procedure Details Consent: Risks of procedure as well as the alternatives and risks of each were explained to the (patient/caregiver).  Consent for procedure obtained. Time Out: Verified patient identification, verified procedure, site/side was marked, verified correct patient position,  special equipment/implants available, medications/allergies/relevent history reviewed, required imaging and test results available.  Performed.  The area was cleaned with iodine and alcohol swabs.    The right and left heel was targeted for Extracorporeal shockwave therapy.   Preset: achillodynia Power Level: 70 Frequency: 11 Impulse/cycles: 3000 Head size: medium  Session: 6  Patient did tolerate procedure well.    ASSESSMENT & PLAN:   Calcific Achilles tendinitis of both lower extremities Completed shockwave therapy

## 2022-02-02 NOTE — Assessment & Plan Note (Signed)
Completed shockwave therapy  

## 2022-02-12 ENCOUNTER — Ambulatory Visit: Payer: Self-pay | Admitting: Family Medicine

## 2022-02-12 ENCOUNTER — Encounter: Payer: Self-pay | Admitting: Family Medicine

## 2022-02-12 VITALS — Ht 68.0 in | Wt 255.0 lb

## 2022-02-12 DIAGNOSIS — M6528 Calcific tendinitis, other site: Secondary | ICD-10-CM

## 2022-02-12 NOTE — Assessment & Plan Note (Signed)
Completed shockwave therapy  

## 2022-02-12 NOTE — Progress Notes (Signed)
  Carlos Ford - 56 y.o. male MRN [id number]  Date of birth: 24-Dec-1965  SUBJECTIVE:  Including CC & ROS.  No chief complaint on file.   Carlos Ford is a 56 y.o. male that is  here for shockwave therapy.    Review of Systems See HPI   HISTORY: Past Medical, Surgical, Social, and Family History Reviewed & Updated per EMR.   Pertinent Historical Findings include:  Past Medical History:  Diagnosis Date   Chronic low back pain    estab emergeortho 11/2020->DDD/postlaminectomy syndrome   Depression    GERD (gastroesophageal reflux disease)    History of adenomatous polyp of colon 2019   Recall 2024   Hyperlipidemia    rec'd statin 04/2020   Hypervitaminosis D 05/03/2020   oversupplementation (10K U tab qd for a couple yrs)   IBS (irritable bowel syndrome)    Insomnia    Migraine syndrome    Dr. Jaynee Eagles    Past Surgical History:  Procedure Laterality Date   COLONOSCOPY N/A 03/07/2018   2019 Adenoma x 1---recall 2024. Procedure: COLONOSCOPY;  Surgeon: Danie Binder, MD;  [facility];  Service: Endoscopy;  Laterality: N/A;  10:30   ESOPHAGOGASTRODUODENOSCOPY  2005   LUMBAR EPIDURAL INJECTION  2014   L4-5   LUMBAR LAMINECTOMY  2014   MOUTH SURGERY  2003   POLYPECTOMY  03/07/2018   Procedure: POLYPECTOMY;  Surgeon: Danie Binder, MD;  [facility];  Service: Endoscopy;;  sigmoid     PHYSICAL EXAM:  VS: Ht '5\' 8"'$  (1.727 m)   Wt 255 lb (115.7 kg)   BMI 38.77 kg/m  Physical Exam Gen: NAD, alert, cooperative with exam, well-appearing MSK:  Neurovascularly intact    ECSWT Note Carlos Ford [facility] Jan 18, 1966  Procedure: ECSWT Indications: right and left heel pain  Procedure Details Consent: Risks of procedure as well as the alternatives and risks of each were explained to the (patient/caregiver).  Consent for procedure obtained. Time Out: Verified patient identification, verified procedure, site/side was marked, verified  correct patient position, special equipment/implants available, medications/allergies/relevent history reviewed, required imaging and test results available.  Performed.  The area was cleaned with iodine and alcohol swabs.    The right and left heel was targeted for Extracorporeal shockwave therapy.   Preset: achillodynia Power Level: 80 Frequency: 9 Impulse/cycles: 3000 Head size: medium  Session: 7  Patient did tolerate procedure well.    ASSESSMENT & PLAN:   Calcific Achilles tendinitis of both lower extremities Completed shockwave therapy

## 2022-02-17 NOTE — Progress Notes (Unsigned)
02/18/2022 ALL: Carlos Ford continues to do well. May have 1 migraine every 12 weeks.   11/24/2021 ALL: he returns for Botox. He continues to do well. He has taken Amerge twice over the past 12 weeks. It continues to work well for abortive therapy.   09/01/2021 ALL: He returns for Botox. He continues to do well. Amerge continues to work well for abortive therapy.   06/05/2021 ALL: He returns for Botox. He continues to do well. Migraines are rare until the last 2-3 weeks prior to Botox. He continues Amerge as needed.   03/05/2021 ALL: Carlos Ford returns for follow up. He continues to do well. Amerge works well for abortive therapy. He rarely has migraines until 2 weeks prior to next Botox procedure.   11/27/2020 ALL: He returns for Botox. He is doing well. No changes in migraines. He continues Amerge for abortive therapy.   08/26/2020 ALL: He continues to do well on Botox. He does not usually have any migraines until about 1-2 weeks prior to next procedure. Amerge works very well for abortive therapy.   05/16/2020 ALL: He is doing well. Migraines are well managed on Botox. He did not have typical end of cycle worsening of headaches this time. He continues Amerge for abortive therapy.   02/07/2020 ALL: He is doing well. He is now working nights. He does have more headaches toward end of Botox cycle. Amerge helps.   Consent Form Botulism Toxin Injection For Chronic Migraine    Reviewed orally with patient, additionally signature is on file:  Botulism toxin has been approved by the Federal drug administration for treatment of chronic migraine. Botulism toxin does not cure chronic migraine and it may not be effective in some patients.  The administration of botulism toxin is accomplished by injecting a small amount of toxin into the muscles of the neck and head. Dosage must be titrated for each individual. Any benefits resulting from botulism toxin tend to wear off after 3 months with a repeat injection  required if benefit is to be maintained. Injections are usually done every 3-4 months with maximum effect peak achieved by about 2 or 3 weeks. Botulism toxin is expensive and you should be sure of what costs you will incur resulting from the injection.  The side effects of botulism toxin use for chronic migraine may include:   -Transient, and usually mild, facial weakness with facial injections  -Transient, and usually mild, head or neck weakness with head/neck injections  -Reduction or loss of forehead facial animation due to forehead muscle weakness  -Eyelid drooping  -Dry eye  -Pain at the site of injection or bruising at the site of injection  -Double vision  -Potential unknown long term risks   Contraindications: You should not have Botox if you are pregnant, nursing, allergic to albumin, have an infection, skin condition, or muscle weakness at the site of the injection, or have myasthenia gravis, Lambert-Eaton syndrome, or ALS.  It is also possible that as with any injection, there may be an allergic reaction or no effect from the medication. Reduced effectiveness after repeated injections is sometimes seen and rarely infection at the injection site may occur. All care will be taken to prevent these side effects. If therapy is given over a long time, atrophy and wasting in the muscle injected may occur. Occasionally the patient's become refractory to treatment because they develop antibodies to the toxin. In this event, therapy needs to be modified.  I have read the above information and consent  to the administration of botulism toxin.    BOTOX PROCEDURE NOTE FOR MIGRAINE HEADACHE  Contraindications and precautions discussed with patient(above). Aseptic procedure was observed and patient tolerated procedure. Procedure performed by Debbora Presto, FNP-C.   The condition has existed for more than 6 months, and pt does not have a diagnosis of ALS, Myasthenia Gravis or Lambert-Eaton Syndrome.   Risks and benefits of injections discussed and pt agrees to proceed with the procedure.  Written consent obtained  These injections are medically necessary. Pt  receives good benefits from these injections. These injections do not cause sedations or hallucinations which the oral therapies may cause.   Description of procedure:  The patient was placed in a sitting position. The standard protocol was used for Botox as follows, with 5 units of Botox injected at each site:  -Procerus muscle, midline injection  -Corrugator muscle, bilateral injection  -Frontalis muscle, bilateral injection, with 2 sites each side, medial injection was performed in the upper one third of the frontalis muscle, in the region vertical from the medial inferior edge of the superior orbital rim. The lateral injection was again in the upper one third of the forehead vertically above the lateral limbus of the cornea, 1.5 cm lateral to the medial injection site.  -Temporalis muscle injection, 4 sites, bilaterally. The first injection was 3 cm above the tragus of the ear, second injection site was 1.5 cm to 3 cm up from the first injection site in line with the tragus of the ear. The third injection site was 1.5-3 cm forward between the first 2 injection sites. The fourth injection site was 1.5 cm posterior to the second injection site. 5th site laterally in the temporalis  muscleat the level of the outer canthus.  -Occipitalis muscle injection, 3 sites, bilaterally. The first injection was done one half way between the occipital protuberance and the tip of the mastoid process behind the ear. The second injection site was done lateral and superior to the first, 1 fingerbreadth from the first injection. The third injection site was 1 fingerbreadth superiorly and medially from the first injection site.  -Cervical paraspinal muscle injection, 2 sites, bilaterally. The first injection site was 1 cm from the midline of the cervical  spine, 3 cm inferior to the lower border of the occipital protuberance. The second injection site was 1.5 cm superiorly and laterally to the first injection site.  -Trapezius muscle injection was performed at 3 sites, bilaterally. The first injection site was in the upper trapezius muscle halfway between the inflection point of the neck, and the acromion. The second injection site was one half way between the acromion and the first injection site. The third injection was done between the first injection site and the inflection point of the neck.   Will return for repeat injection in 3 months.   A total of 200 units of Botox was prepared, 155 units of Botox was injected as documented above, any Botox not injected was wasted. The patient tolerated the procedure well, there were no complications of the above procedure.

## 2022-02-18 ENCOUNTER — Ambulatory Visit: Payer: BC Managed Care – PPO | Admitting: Family Medicine

## 2022-02-18 ENCOUNTER — Encounter: Payer: Self-pay | Admitting: Family Medicine

## 2022-02-18 VITALS — BP 129/91 | HR 80 | Ht 68.0 in | Wt 263.0 lb

## 2022-02-18 DIAGNOSIS — G43709 Chronic migraine without aura, not intractable, without status migrainosus: Secondary | ICD-10-CM | POA: Diagnosis not present

## 2022-02-18 MED ORDER — ONABOTULINUMTOXINA 200 UNITS IJ SOLR
155.0000 [IU] | Freq: Once | INTRAMUSCULAR | Status: AC
  Administered 2022-02-18: 155 [IU] via INTRAMUSCULAR

## 2022-02-23 ENCOUNTER — Ambulatory Visit: Payer: BC Managed Care – PPO | Admitting: Family Medicine

## 2022-02-23 ENCOUNTER — Encounter: Payer: Self-pay | Admitting: Family Medicine

## 2022-02-23 VITALS — BP 113/75 | HR 87 | Temp 98.0°F | Ht 68.0 in | Wt 265.6 lb

## 2022-02-23 DIAGNOSIS — R030 Elevated blood-pressure reading, without diagnosis of hypertension: Secondary | ICD-10-CM

## 2022-02-23 NOTE — Progress Notes (Signed)
OFFICE VISIT  02/23/2022  CC:  Chief Complaint  Patient presents with   Elevated Blood Pressure    Patient is a 56 y.o. male who presents for elevated blood pressure  HPI: Carlos Ford quit dipping tobacco about 2 weeks ago.  He had dipped a candidate for 40 years. Definitely felt bad withdrawal symptoms: Some tremulousness, some feeling of fullness in his head and chest, odd tingling at times. Says the last few days most of the physical symptoms seem to be driven more by psychological distress of not having his DIP to turn to. Additionally, blood pressure has been up into the 903E systolic into the 09Q diastolic over this time. At his neurology appointment for Botox injections his blood pressure was around 130/90.  Denies headaches, visual abnormalities, dizziness, chest pain, shortness of breath, nausea, arm pain, or jaw pain.  Past Medical History:  Diagnosis Date   Chronic low back pain    estab emergeortho 11/2020->DDD/postlaminectomy syndrome   Depression    GERD (gastroesophageal reflux disease)    History of adenomatous polyp of colon 2019   Recall 2024   Hyperlipidemia    rec'd statin 04/2020   Hypervitaminosis D 05/03/2020   oversupplementation (10K U tab qd for a couple yrs)   IBS (irritable bowel syndrome)    Insomnia    Migraine syndrome    Dr. Jaynee Eagles    Past Surgical History:  Procedure Laterality Date   COLONOSCOPY N/A 03/07/2018   2019 Adenoma x 1---recall 2024. Procedure: COLONOSCOPY;  Surgeon: Danie Binder, MD;  [facility];  Service: Endoscopy;  Laterality: N/A;  10:30   ESOPHAGOGASTRODUODENOSCOPY  2005   LUMBAR EPIDURAL INJECTION  2014   L4-5   LUMBAR LAMINECTOMY  2014   MOUTH SURGERY  2003   POLYPECTOMY  03/07/2018   Procedure: POLYPECTOMY;  Surgeon: Danie Binder, MD;  [facility];  Service: Endoscopy;;  sigmoid    Outpatient Medications Prior to Visit  Medication Sig Dispense Refill   BOTOX 100 units SOLR injection  PROVIDER TO INJECT 155 UNITS INTO THE MUSCLES OF THE HEAD AND NECK EVERY 3 MONTHS. DISCARD REMAINDER. 200 Units 1   co-enzyme Q-10 50 MG capsule Take 200 mg by mouth daily.     Cyanocobalamin (VITAMIN B-12 PO) Take by mouth.     cyclobenzaprine (FLEXERIL) 10 MG tablet Take 1 tablet (10 mg total) by mouth 3 (three) times daily as needed for muscle spasms. 90 tablet 1   loratadine (CLARITIN) 10 MG tablet Take 10 mg by mouth daily.     Magnesium 500 MG TABS Take 500 mg by mouth daily.     Multiple Vitamins-Minerals (ZINC PO) Take 12 mg by mouth.     naratriptan (AMERGE) 2.5 MG tablet Take one (1) tablet at onset of headache; if returns or does not resolve, may repeat after 4 hours; do not exceed five (5) mg in 24 hours. 9 tablet 2   prochlorperazine (COMPAZINE) 10 MG tablet Take 1 tablet (10 mg total) by mouth every 6 (six) hours as needed for nausea or vomiting. 30 tablet 0   traZODone (DESYREL) 50 MG tablet 1 tab po qhs prn insomnia 90 tablet 3   VITAMIN D PO Take 10,000 Int'l Units by mouth.     VITAMIN K PO Take 200 mg by mouth.     No facility-administered medications prior to visit.    Allergies  Allergen Reactions   Amoxicillin    Penicillins    Pseudoephedrine Palpitations  Sulfa Antibiotics Rash   Wellbutrin [Bupropion] Nausea Only, Anxiety and Other (See Comments)    Insomnia and paranoia    ROS As per HPI  PE:    02/23/2022    8:00 AM 02/18/2022    3:36 PM 02/12/2022    3:39 PM  Vitals with BMI  Height '5\' 8"'$  '5\' 8"'$  '5\' 8"'$   Weight 265 lbs 10 oz 263 lbs 255 lbs  BMI 75.17 40 00.17  Systolic 494 496   Diastolic 75 91   Pulse 87 80      Physical Exam  Gen: Alert, well appearing.  Patient is oriented to person, place, time, and situation. AFFECT: pleasant, lucid thought and speech. CV: RRR, no m/r/g.   LUNGS: CTA bilat, nonlabored resps, good aeration in all lung fields. Neuro: CN 2-12 intact bilaterally, strength 5/5 in proximal and distal upper extremities and  lower extremities bilaterally.No tremor.   No ataxia.  Upper extremity and lower extremity DTRs symmetric.  No pronator drift.   LABS:  Last metabolic panel Lab Results  Component Value Date   GLUCOSE 115 (H) 09/29/2021   NA 139 09/29/2021   K 4.3 09/29/2021   CL 103 09/29/2021   CO2 26 09/29/2021   BUN 20 09/29/2021   CREATININE 0.98 09/29/2021   CALCIUM 9.0 09/29/2021   PROT 7.1 09/29/2021   ALBUMIN 4.5 09/29/2021   BILITOT 0.5 09/29/2021   ALKPHOS 86 09/29/2021   AST 18 09/29/2021   ALT 23 09/29/2021   IMPRESSION AND PLAN:  #1 nicotine withdrawal.  This is gradually dissipating.  He has some mild blood pressure elevations associated with this but blood pressure well within normal limits today.  Will continue to monitor blood pressure and he will let me know if they remain persistently elevated after his withdrawal symptoms have completely resolved.  An After Visit Summary was printed and given to the patient.  FOLLOW UP: Return if symptoms worsen or fail to improve.  Signed:  Crissie Sickles, MD           02/23/2022

## 2022-04-02 ENCOUNTER — Encounter: Payer: Self-pay | Admitting: Family Medicine

## 2022-04-02 ENCOUNTER — Ambulatory Visit: Payer: BC Managed Care – PPO | Admitting: Family Medicine

## 2022-04-02 VITALS — BP 122/83 | HR 97 | Temp 98.5°F | Ht 68.0 in | Wt 267.2 lb

## 2022-04-02 DIAGNOSIS — B029 Zoster without complications: Secondary | ICD-10-CM | POA: Diagnosis not present

## 2022-04-02 DIAGNOSIS — U071 COVID-19: Secondary | ICD-10-CM | POA: Diagnosis not present

## 2022-04-02 DIAGNOSIS — B0230 Zoster ocular disease, unspecified: Secondary | ICD-10-CM

## 2022-04-02 LAB — POC COVID19 BINAXNOW: SARS Coronavirus 2 Ag: POSITIVE — AB

## 2022-04-02 MED ORDER — VALACYCLOVIR HCL 1 G PO TABS: 1000.0000 mg | ORAL_TABLET | Freq: Three times a day (TID) | ORAL | 0 refills | Status: AC

## 2022-04-02 MED ORDER — NIRMATRELVIR/RITONAVIR (PAXLOVID)TABLET: 3.0000 | ORAL_TABLET | Freq: Two times a day (BID) | ORAL | 0 refills | Status: AC

## 2022-04-02 NOTE — Progress Notes (Signed)
OFFICE VISIT  04/02/2022  CC:  Chief Complaint  Patient presents with   Generalized Body Aches    Pt also nasal congestion. Has been using saline rinse q12h    Patient is a 57 y.o. male who presents for rash.  HPI: 2 days ago he had onset of nasal congestion and sinus pressure, generalized headache, postnasal drip with cough, and chills and bodyaches.  Also noticed a painful bump on the tip of his nose.  Intermittent aching in the right ear region. 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  ROS as above, plus--> no CP, no SOB, no wheezing, no dizziness, no rashes, no melena/hematochezia.  No polyuria or polydipsia.  No focal weakness, paresthesias, or tremors.  No acute vision or hearing abnormalities.  No dysuria or unusual/new urinary urgency or frequency.  No recent changes in lower legs. No n/v/d or abd pain.  No palpitations.    Past Medical History:  Diagnosis Date   Achilles tendon disorder    bilat calcific tendonopathy (shockwave therapy, Dr. Raeford Razor)   Chronic low back pain    estab emergeortho 11/2020->DDD/postlaminectomy syndrome   Depression    GERD (gastroesophageal reflux disease)    History of adenomatous polyp of colon 2019   Recall 2024   Hyperlipidemia    rec'd statin 04/2020   Hypervitaminosis D 05/03/2020   oversupplementation (10K U tab qd for a couple yrs)   IBS (irritable bowel syndrome)    Insomnia    Migraine syndrome    Dr. Jaynee Eagles    Past Surgical History:  Procedure Laterality Date   COLONOSCOPY N/A 03/07/2018   2019 Adenoma x 1---recall 2024. Procedure: COLONOSCOPY;  Surgeon: Danie Binder, MD;  [facility];  Service: Endoscopy;  Laterality: N/A;  10:30   ESOPHAGOGASTRODUODENOSCOPY  2005   LUMBAR EPIDURAL INJECTION  2014   L4-5   LUMBAR LAMINECTOMY  2014   MOUTH SURGERY  2003   POLYPECTOMY  03/07/2018   Procedure: POLYPECTOMY;  Surgeon: Danie Binder, MD;  [facility];  Service:  Endoscopy;;  sigmoid    Outpatient Medications Prior to Visit  Medication Sig Dispense Refill   BOTOX 100 units SOLR injection PROVIDER TO INJECT 155 UNITS INTO THE MUSCLES OF THE HEAD AND NECK EVERY 3 MONTHS. DISCARD REMAINDER. 200 Units 1   co-enzyme Q-10 50 MG capsule Take 200 mg by mouth daily.     Cyanocobalamin (VITAMIN B-12 PO) Take by mouth.     cyclobenzaprine (FLEXERIL) 10 MG tablet Take 1 tablet (10 mg total) by mouth 3 (three) times daily as needed for muscle spasms. 90 tablet 1   loratadine (CLARITIN) 10 MG tablet Take 10 mg by mouth daily.     Magnesium 500 MG TABS Take 500 mg by mouth daily.     Multiple Vitamins-Minerals (ZINC PO) Take 12 mg by mouth.     naratriptan (AMERGE) 2.5 MG tablet Take one (1) tablet at onset of headache; if returns or does not resolve, may repeat after 4 hours; do not exceed five (5) mg in 24 hours. 9 tablet 2   traZODone (DESYREL) 50 MG tablet 1 tab po qhs prn insomnia 90 tablet 3   VITAMIN D PO Take 10,000 Int'l Units by mouth.     VITAMIN K PO Take 200 mg by mouth.     prochlorperazine (COMPAZINE) 10 MG tablet Take 1 tablet (10 mg total) by mouth every 6 (six) hours as needed for nausea  or vomiting. (Patient not taking: Reported on 04/02/2022) 30 tablet 0   No facility-administered medications prior to visit.    Allergies  Allergen Reactions   Amoxicillin    Penicillins    Pseudoephedrine Palpitations   Sulfa Antibiotics Rash   Wellbutrin [Bupropion] Nausea Only, Anxiety and Other (See Comments)    Insomnia and paranoia    Review of Systems  As per HPI  PE:    04/02/2022    2:58 PM 02/23/2022    8:00 AM 02/18/2022    3:36 PM  Vitals with BMI  Height '5\' 8"'$  '5\' 8"'$  '5\' 8"'$   Weight 267 lbs 3 oz 265 lbs 10 oz 263 lbs  BMI 35.70 17.79 40  Systolic 390 300 923  Diastolic 83 75 91  Pulse 97 87 80     Physical Exam  VS: noted--normal. Gen: alert, NAD, NONTOXIC APPEARING. HEENT: eyes without injection, drainage, or swelling.  Ears:  EACs clear, TMs with normal light reflex and landmarks.  Nose: Clear rhinorrhea, with some dried, crusty exudate adherent to mildly injected mucosa.  No purulent d/c.  No paranasal sinus TTP.  No facial swelling.  Throat and mouth without focal lesion.  No pharyngial swelling, erythema, or exudate.   Neck: supple, no LAD.   LUNGS: CTA bilat, nonlabored resps.   CV: RRR, no m/r/g. EXT: no c/c/e SKIN: He has a small erythematous papulovesicle near the tip of his nose in the midline.  LABS:  Last CBC Lab Results  Component Value Date   WBC 6.2 09/29/2021   HGB 14.9 09/29/2021   HCT 45.0 09/29/2021   MCV 86.5 09/29/2021   RDW 14.4 09/29/2021   PLT 248.0 30/09/6224   Last metabolic panel Lab Results  Component Value Date   GLUCOSE 115 (H) 09/29/2021   NA 139 09/29/2021   K 4.3 09/29/2021   CL 103 09/29/2021   CO2 26 09/29/2021   BUN 20 09/29/2021   CREATININE 0.98 09/29/2021   CALCIUM 9.0 09/29/2021   PROT 7.1 09/29/2021   ALBUMIN 4.5 09/29/2021   BILITOT 0.5 09/29/2021   ALKPHOS 86 09/29/2021   AST 18 09/29/2021   ALT 23 09/29/2021   IMPRESSION AND PLAN:  #1 viral respiratory syndrome, COVID-positive here in the office today. Patient's risk factor for complications due to COVID are: BMI 40.  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 Start Valtrex 1 g 3 times daily x 7 days. Urgent ophthalmology referral ordered today.  An After Visit Summary was printed and given to the patient.  FOLLOW UP: Return if symptoms worsen or fail to improve.  Signed:  Crissie Sickles, MD           04/02/2022

## 2022-04-03 ENCOUNTER — Telehealth: Payer: Self-pay

## 2022-04-03 NOTE — Telephone Encounter (Signed)
Please advise how soon patient needs to be seen.

## 2022-04-03 NOTE — Telephone Encounter (Addendum)
Spoke with someone from office to get an update about scheduling appt, transferred to [place] but had to LVM for return call.  Address: [address], [place], [place] [address] Phone: [phone]

## 2022-04-03 NOTE — Telephone Encounter (Signed)
Carlos Ford called from [facility] stated they received a referral from Dr. Anitra Lauth requesting to be seen asap because of Shingles near eye area. Patient is COVID + and they aren't able to see patient as he currently has COVID.

## 2022-04-03 NOTE — Telephone Encounter (Signed)
5 days from yesterday is fine

## 2022-04-05 ENCOUNTER — Other Ambulatory Visit: Payer: Self-pay | Admitting: Family Medicine

## 2022-04-07 NOTE — Telephone Encounter (Signed)
Can you check the status of appt?

## 2022-04-08 ENCOUNTER — Telehealth: Payer: Self-pay | Admitting: Family Medicine

## 2022-04-08 MED ORDER — HYDROCODONE BIT-HOMATROP MBR 5-1.5 MG/5ML PO SOLN: ORAL | 0 refills | Status: DC

## 2022-04-08 NOTE — Telephone Encounter (Signed)
Please advise 

## 2022-04-08 NOTE — Telephone Encounter (Signed)
Pt is requesting a medication for his cough. He reports that he needs this due to having trouble sleeping at night.

## 2022-04-08 NOTE — Telephone Encounter (Signed)
Pt was advised to still schedule with [facility]. He will check mychart for contact information

## 2022-04-08 NOTE — Telephone Encounter (Signed)
Pt advised medication sent.

## 2022-04-08 NOTE — Telephone Encounter (Signed)
Hycodan rx sent

## 2022-04-10 DIAGNOSIS — M10079 Idiopathic gout, unspecified ankle and foot: Secondary | ICD-10-CM | POA: Diagnosis not present

## 2022-04-10 DIAGNOSIS — M2041 Other hammer toe(s) (acquired), right foot: Secondary | ICD-10-CM | POA: Diagnosis not present

## 2022-04-10 DIAGNOSIS — M25571 Pain in right ankle and joints of right foot: Secondary | ICD-10-CM | POA: Diagnosis not present

## 2022-04-10 DIAGNOSIS — M7751 Other enthesopathy of right foot: Secondary | ICD-10-CM | POA: Diagnosis not present

## 2022-04-24 DIAGNOSIS — M25571 Pain in right ankle and joints of right foot: Secondary | ICD-10-CM | POA: Diagnosis not present

## 2022-04-24 DIAGNOSIS — M7751 Other enthesopathy of right foot: Secondary | ICD-10-CM | POA: Diagnosis not present

## 2022-05-08 DIAGNOSIS — M25571 Pain in right ankle and joints of right foot: Secondary | ICD-10-CM | POA: Diagnosis not present

## 2022-05-08 DIAGNOSIS — M722 Plantar fascial fibromatosis: Secondary | ICD-10-CM | POA: Diagnosis not present

## 2022-05-08 DIAGNOSIS — M7751 Other enthesopathy of right foot: Secondary | ICD-10-CM | POA: Diagnosis not present

## 2022-05-13 NOTE — Progress Notes (Unsigned)
2/29/2023 ALL: He returns for Botox. He continues to do well. Has had two migraines over past 12 weeks. Amerge works well.   02/18/2022 ALL: Jonni Sanger continues to do well. May have 1 migraine every 12 weeks.   11/24/2021 ALL: he returns for Botox. He continues to do well. He has taken Amerge twice over the past 12 weeks. It continues to work well for abortive therapy.   09/01/2021 ALL: He returns for Botox. He continues to do well. Amerge continues to work well for abortive therapy.   06/05/2021 ALL: He returns for Botox. He continues to do well. Migraines are rare until the last 2-3 weeks prior to Botox. He continues Amerge as needed.   03/05/2021 ALL: Davien returns for follow up. He continues to do well. Amerge works well for abortive therapy. He rarely has migraines until 2 weeks prior to next Botox procedure.   11/27/2020 ALL: He returns for Botox. He is doing well. No changes in migraines. He continues Amerge for abortive therapy.   08/26/2020 ALL: He continues to do well on Botox. He does not usually have any migraines until about 1-2 weeks prior to next procedure. Amerge works very well for abortive therapy.   05/16/2020 ALL: He is doing well. Migraines are well managed on Botox. He did not have typical end of cycle worsening of headaches this time. He continues Amerge for abortive therapy.   02/07/2020 ALL: He is doing well. He is now working nights. He does have more headaches toward end of Botox cycle. Amerge helps.   Consent Form Botulism Toxin Injection For Chronic Migraine    Reviewed orally with patient, additionally signature is on file:  Botulism toxin has been approved by the Federal drug administration for treatment of chronic migraine. Botulism toxin does not cure chronic migraine and it may not be effective in some patients.  The administration of botulism toxin is accomplished by injecting a small amount of toxin into the muscles of the neck and head. Dosage must be  titrated for each individual. Any benefits resulting from botulism toxin tend to wear off after 3 months with a repeat injection required if benefit is to be maintained. Injections are usually done every 3-4 months with maximum effect peak achieved by about 2 or 3 weeks. Botulism toxin is expensive and you should be sure of what costs you will incur resulting from the injection.  The side effects of botulism toxin use for chronic migraine may include:   -Transient, and usually mild, facial weakness with facial injections  -Transient, and usually mild, head or neck weakness with head/neck injections  -Reduction or loss of forehead facial animation due to forehead muscle weakness  -Eyelid drooping  -Dry eye  -Pain at the site of injection or bruising at the site of injection  -Double vision  -Potential unknown long term risks   Contraindications: You should not have Botox if you are pregnant, nursing, allergic to albumin, have an infection, skin condition, or muscle weakness at the site of the injection, or have myasthenia gravis, Lambert-Eaton syndrome, or ALS.  It is also possible that as with any injection, there may be an allergic reaction or no effect from the medication. Reduced effectiveness after repeated injections is sometimes seen and rarely infection at the injection site may occur. All care will be taken to prevent these side effects. If therapy is given over a long time, atrophy and wasting in the muscle injected may occur. Occasionally the patient's become refractory to treatment  because they develop antibodies to the toxin. In this event, therapy needs to be modified.  I have read the above information and consent to the administration of botulism toxin.    BOTOX PROCEDURE NOTE FOR MIGRAINE HEADACHE  Contraindications and precautions discussed with patient(above). Aseptic procedure was observed and patient tolerated procedure. Procedure performed by Debbora Presto, FNP-C.   The  condition has existed for more than 6 months, and pt does not have a diagnosis of ALS, Myasthenia Gravis or Lambert-Eaton Syndrome.  Risks and benefits of injections discussed and pt agrees to proceed with the procedure.  Written consent obtained  These injections are medically necessary. Pt  receives good benefits from these injections. These injections do not cause sedations or hallucinations which the oral therapies may cause.   Description of procedure:  The patient was placed in a sitting position. The standard protocol was used for Botox as follows, with 5 units of Botox injected at each site:  -Procerus muscle, midline injection  -Corrugator muscle, bilateral injection  -Frontalis muscle, bilateral injection, with 2 sites each side, medial injection was performed in the upper one third of the frontalis muscle, in the region vertical from the medial inferior edge of the superior orbital rim. The lateral injection was again in the upper one third of the forehead vertically above the lateral limbus of the cornea, 1.5 cm lateral to the medial injection site.  -Temporalis muscle injection, 4 sites, bilaterally. The first injection was 3 cm above the tragus of the ear, second injection site was 1.5 cm to 3 cm up from the first injection site in line with the tragus of the ear. The third injection site was 1.5-3 cm forward between the first 2 injection sites. The fourth injection site was 1.5 cm posterior to the second injection site. 5th site laterally in the temporalis  muscleat the level of the outer canthus.  -Occipitalis muscle injection, 3 sites, bilaterally. The first injection was done one half way between the occipital protuberance and the tip of the mastoid process behind the ear. The second injection site was done lateral and superior to the first, 1 fingerbreadth from the first injection. The third injection site was 1 fingerbreadth superiorly and medially from the first injection  site.  -Cervical paraspinal muscle injection, 2 sites, bilaterally. The first injection site was 1 cm from the midline of the cervical spine, 3 cm inferior to the lower border of the occipital protuberance. The second injection site was 1.5 cm superiorly and laterally to the first injection site.  -Trapezius muscle injection was performed at 3 sites, bilaterally. The first injection site was in the upper trapezius muscle halfway between the inflection point of the neck, and the acromion. The second injection site was one half way between the acromion and the first injection site. The third injection was done between the first injection site and the inflection point of the neck.   Will return for repeat injection in 3 months.   A total of 200 units of Botox was prepared, 155 units of Botox was injected as documented above, any Botox not injected was wasted. The patient tolerated the procedure well, there were no complications of the above procedure.

## 2022-05-14 ENCOUNTER — Ambulatory Visit: Payer: BC Managed Care – PPO | Admitting: Family Medicine

## 2022-05-14 DIAGNOSIS — G43709 Chronic migraine without aura, not intractable, without status migrainosus: Secondary | ICD-10-CM | POA: Diagnosis not present

## 2022-05-14 MED ORDER — ONABOTULINUMTOXINA 200 UNITS IJ SOLR
155.0000 [IU] | Freq: Once | INTRAMUSCULAR | Status: AC
  Administered 2022-05-14: 155 [IU] via INTRAMUSCULAR

## 2022-05-14 NOTE — Progress Notes (Signed)
Botox-200U x 1vial Lot: [id number] Expiration: 08/2024 NDC: [id number]  Bacteriostatic 0.9% Sodium Chloride- 18m total [id number]:01/2024 NDC: [place]botox.

## 2022-05-25 ENCOUNTER — Ambulatory Visit: Payer: BC Managed Care – PPO | Admitting: Neurology

## 2022-06-29 ENCOUNTER — Encounter: Payer: Self-pay | Admitting: *Deleted

## 2022-07-02 ENCOUNTER — Other Ambulatory Visit: Payer: Self-pay

## 2022-07-02 ENCOUNTER — Encounter: Payer: Self-pay | Admitting: Family Medicine

## 2022-07-02 MED ORDER — NARATRIPTAN HCL 2.5 MG PO TABS: ORAL_TABLET | ORAL | 2 refills | Status: DC

## 2022-07-02 NOTE — Telephone Encounter (Signed)
Pt Last seen 05/14/2022 Upcoming Appointment 08/24/2022  Naratriptan last filled 02/18/2022 Escript 07/02/2022 2 Refills

## 2022-07-29 ENCOUNTER — Encounter: Payer: Self-pay | Admitting: Family Medicine

## 2022-08-03 MED ORDER — PREDNISONE 10 MG (21) PO TBPK: ORAL_TABLET | ORAL | 0 refills | Status: DC

## 2022-08-04 ENCOUNTER — Telehealth: Payer: Self-pay | Admitting: Family Medicine

## 2022-08-04 NOTE — Telephone Encounter (Signed)
Called [employer] to check if PA was needed for 16109 or [phone]. Rep states that prior auth is not required but pre-determination is recommended. Initiated pre-determination over the phone, case # [id number]. I faxed clinical notes to [phone] for review.

## 2022-08-13 NOTE — Telephone Encounter (Signed)
Called to check status of pre-determination, rep states Carlos Ford is still pending and should be completed on or before 08/19/22. Will call back next week to check status. Ref # for call [id number].

## 2022-08-17 NOTE — Telephone Encounter (Signed)
Received fax of pre-determination approval: Ref #: [id number] (08/24/22-03/16/23).

## 2022-08-19 DIAGNOSIS — M961 Postlaminectomy syndrome, not elsewhere classified: Secondary | ICD-10-CM | POA: Diagnosis not present

## 2022-08-19 DIAGNOSIS — M5416 Radiculopathy, lumbar region: Secondary | ICD-10-CM | POA: Diagnosis not present

## 2022-08-21 ENCOUNTER — Other Ambulatory Visit: Payer: Self-pay | Admitting: *Deleted

## 2022-08-21 DIAGNOSIS — M5416 Radiculopathy, lumbar region: Secondary | ICD-10-CM

## 2022-08-21 DIAGNOSIS — M5431 Sciatica, right side: Secondary | ICD-10-CM

## 2022-08-21 DIAGNOSIS — M961 Postlaminectomy syndrome, not elsewhere classified: Secondary | ICD-10-CM

## 2022-08-21 DIAGNOSIS — M47816 Spondylosis without myelopathy or radiculopathy, lumbar region: Secondary | ICD-10-CM

## 2022-08-21 DIAGNOSIS — M5432 Sciatica, left side: Secondary | ICD-10-CM

## 2022-08-24 ENCOUNTER — Ambulatory Visit: Payer: BC Managed Care – PPO | Admitting: Family Medicine

## 2022-08-24 DIAGNOSIS — G43709 Chronic migraine without aura, not intractable, without status migrainosus: Secondary | ICD-10-CM

## 2022-08-24 MED ORDER — ONABOTULINUMTOXINA 100 UNITS IJ SOLR
100.0000 [IU] | Freq: Once | INTRAMUSCULAR | Status: AC
Start: 2022-08-24 — End: 2022-08-24
  Administered 2022-08-24: 155 [IU] via INTRAMUSCULAR

## 2022-08-24 NOTE — Progress Notes (Signed)
08/24/2022 ALL: Carlos Ford returns for Botox. He continues to do well. He estimates about 1 migraine a month or less. Amerge works well.   05/14/2022 ALL: He returns for Botox. He continues to do well. Has had two migraines over past 12 weeks. Amerge works well.   02/18/2022 ALL: Carlos Ford continues to do well. May have 1 migraine every 12 weeks.   11/24/2021 ALL: he returns for Botox. He continues to do well. He has taken Amerge twice over the past 12 weeks. It continues to work well for abortive therapy.   09/01/2021 ALL: He returns for Botox. He continues to do well. Amerge continues to work well for abortive therapy.   06/05/2021 ALL: He returns for Botox. He continues to do well. Migraines are rare until the last 2-3 weeks prior to Botox. He continues Amerge as needed.   03/05/2021 ALL: Carlos Ford returns for follow up. He continues to do well. Amerge works well for abortive therapy. He rarely has migraines until 2 weeks prior to next Botox procedure.   11/27/2020 ALL: He returns for Botox. He is doing well. No changes in migraines. He continues Amerge for abortive therapy.   08/26/2020 ALL: He continues to do well on Botox. He does not usually have any migraines until about 1-2 weeks prior to next procedure. Amerge works very well for abortive therapy.   05/16/2020 ALL: He is doing well. Migraines are well managed on Botox. He did not have typical end of cycle worsening of headaches this time. He continues Amerge for abortive therapy.   02/07/2020 ALL: He is doing well. He is now working nights. He does have more headaches toward end of Botox cycle. Amerge helps.   Consent Form Botulism Toxin Injection For Chronic Migraine    Reviewed orally with patient, additionally signature is on file:  Botulism toxin has been approved by the Federal drug administration for treatment of chronic migraine. Botulism toxin does not cure chronic migraine and it may not be effective in some patients.  The  administration of botulism toxin is accomplished by injecting a small amount of toxin into the muscles of the neck and head. Dosage must be titrated for each individual. Any benefits resulting from botulism toxin tend to wear off after 3 months with a repeat injection required if benefit is to be maintained. Injections are usually done every 3-4 months with maximum effect peak achieved by about 2 or 3 weeks. Botulism toxin is expensive and you should be sure of what costs you will incur resulting from the injection.  The side effects of botulism toxin use for chronic migraine may include:   -Transient, and usually mild, facial weakness with facial injections  -Transient, and usually mild, head or neck weakness with head/neck injections  -Reduction or loss of forehead facial animation due to forehead muscle weakness  -Eyelid drooping  -Dry eye  -Pain at the site of injection or bruising at the site of injection  -Double vision  -Potential unknown long term risks   Contraindications: You should not have Botox if you are pregnant, nursing, allergic to albumin, have an infection, skin condition, or muscle weakness at the site of the injection, or have myasthenia gravis, Lambert-Eaton syndrome, or ALS.  It is also possible that as with any injection, there may be an allergic reaction or no effect from the medication. Reduced effectiveness after repeated injections is sometimes seen and rarely infection at the injection site may occur. All care will be taken to prevent these side  effects. If therapy is given over a long time, atrophy and wasting in the muscle injected may occur. Occasionally the patient's become refractory to treatment because they develop antibodies to the toxin. In this event, therapy needs to be modified.  I have read the above information and consent to the administration of botulism toxin.    BOTOX PROCEDURE NOTE FOR MIGRAINE HEADACHE  Contraindications and precautions  discussed with patient(above). Aseptic procedure was observed and patient tolerated procedure. Procedure performed by Shawnie Dapper, FNP-C.   The condition has existed for more than 6 months, and pt does not have a diagnosis of ALS, Myasthenia Gravis or Lambert-Eaton Syndrome.  Risks and benefits of injections discussed and pt agrees to proceed with the procedure.  Written consent obtained  These injections are medically necessary. Pt  receives good benefits from these injections. These injections do not cause sedations or hallucinations which the oral therapies may cause.   Description of procedure:  The patient was placed in a sitting position. The standard protocol was used for Botox as follows, with 5 units of Botox injected at each site:  -Procerus muscle, midline injection  -Corrugator muscle, bilateral injection  -Frontalis muscle, bilateral injection, with 2 sites each side, medial injection was performed in the upper one third of the frontalis muscle, in the region vertical from the medial inferior edge of the superior orbital rim. The lateral injection was again in the upper one third of the forehead vertically above the lateral limbus of the cornea, 1.5 cm lateral to the medial injection site.  -Temporalis muscle injection, 4 sites, bilaterally. The first injection was 3 cm above the tragus of the ear, second injection site was 1.5 cm to 3 cm up from the first injection site in line with the tragus of the ear. The third injection site was 1.5-3 cm forward between the first 2 injection sites. The fourth injection site was 1.5 cm posterior to the second injection site. 5th site laterally in the temporalis  muscleat the level of the outer canthus.  -Occipitalis muscle injection, 3 sites, bilaterally. The first injection was done one half way between the occipital protuberance and the tip of the mastoid process behind the ear. The second injection site was done lateral and superior to the  first, 1 fingerbreadth from the first injection. The third injection site was 1 fingerbreadth superiorly and medially from the first injection site.  -Cervical paraspinal muscle injection, 2 sites, bilaterally. The first injection site was 1 cm from the midline of the cervical spine, 3 cm inferior to the lower border of the occipital protuberance. The second injection site was 1.5 cm superiorly and laterally to the first injection site.  -Trapezius muscle injection was performed at 3 sites, bilaterally. The first injection site was in the upper trapezius muscle halfway between the inflection point of the neck, and the acromion. The second injection site was one half way between the acromion and the first injection site. The third injection was done between the first injection site and the inflection point of the neck.   Will return for repeat injection in 3 months.   A total of 200 units of Botox was prepared, 155 units of Botox was injected as documented above, any Botox not injected was wasted. The patient tolerated the procedure well, there were no complications of the above procedure.

## 2022-08-24 NOTE — Progress Notes (Signed)
Botox- 100 units x 2 vial Lot: [id number] Expiration: 07/2024 NDC: 1191-4782-95  Bacteriostatic 0.9% Sodium Chloride- 4mL total Lot: [id number] Expiration: 06/15/2023 NDC: 6578-4696-29  Dx: G43.709 B/B   Witnessed By: Marcelina Morel. RN

## 2022-08-25 ENCOUNTER — Encounter: Payer: Self-pay | Admitting: Family Medicine

## 2022-08-25 ENCOUNTER — Telehealth: Payer: Self-pay | Admitting: Family Medicine

## 2022-08-25 ENCOUNTER — Ambulatory Visit: Payer: BC Managed Care – PPO | Admitting: Family Medicine

## 2022-08-25 ENCOUNTER — Other Ambulatory Visit: Payer: Self-pay | Admitting: Family Medicine

## 2022-08-25 VITALS — BP 128/80 | HR 93 | Wt 249.0 lb

## 2022-08-25 DIAGNOSIS — Z794 Long term (current) use of insulin: Secondary | ICD-10-CM

## 2022-08-25 DIAGNOSIS — R351 Nocturia: Secondary | ICD-10-CM

## 2022-08-25 DIAGNOSIS — R634 Abnormal weight loss: Secondary | ICD-10-CM

## 2022-08-25 DIAGNOSIS — E119 Type 2 diabetes mellitus without complications: Secondary | ICD-10-CM | POA: Diagnosis not present

## 2022-08-25 DIAGNOSIS — R3589 Other polyuria: Secondary | ICD-10-CM

## 2022-08-25 DIAGNOSIS — R81 Glycosuria: Secondary | ICD-10-CM

## 2022-08-25 DIAGNOSIS — R631 Polydipsia: Secondary | ICD-10-CM

## 2022-08-25 LAB — POC URINALSYSI DIPSTICK (AUTOMATED)
Bilirubin, UA: NEGATIVE
Blood, UA: NEGATIVE
Glucose, UA: POSITIVE — AB
Ketones, UA: NEGATIVE
Leukocytes, UA: NEGATIVE
Nitrite, UA: NEGATIVE
Protein, UA: POSITIVE — AB
Spec Grav, UA: 1.025 (ref 1.010–1.025)
Urobilinogen, UA: 1 E.U./dL
pH, UA: 6 (ref 5.0–8.0)

## 2022-08-25 LAB — COMPREHENSIVE METABOLIC PANEL
ALT: 26 U/L (ref 0–53)
AST: 16 U/L (ref 0–37)
Albumin: 4.6 g/dL (ref 3.5–5.2)
Alkaline Phosphatase: 123 U/L — ABNORMAL HIGH (ref 39–117)
BUN: 14 mg/dL (ref 6–23)
CO2: 29 mEq/L (ref 19–32)
Calcium: 9.6 mg/dL (ref 8.4–10.5)
Chloride: 97 mEq/L (ref 96–112)
Creatinine, Ser: 1.03 mg/dL (ref 0.40–1.50)
GFR: 81.29 mL/min (ref >60.00)
Glucose, Bld: 322 mg/dL — ABNORMAL HIGH (ref 70–99)
Potassium: 4.3 mEq/L (ref 3.5–5.1)
Sodium: 136 mEq/L (ref 135–145)
Total Bilirubin: 0.6 mg/dL (ref 0.2–1.2)
Total Protein: 7.9 g/dL (ref 6.0–8.3)

## 2022-08-25 LAB — POCT GLYCOSYLATED HEMOGLOBIN (HGB A1C)
HbA1c POC (<> result, manual entry): 11.3 % (ref 4.0–5.6)
HbA1c, POC (controlled diabetic range): 11.3 % — AB (ref 0.0–7.0)
HbA1c, POC (prediabetic range): 11.3 % — AB (ref 5.7–6.4)
Hemoglobin A1C: 11.3 % — AB (ref 4.0–5.6)

## 2022-08-25 LAB — GLUCOSE, POCT (MANUAL RESULT ENTRY): POC Glucose: 310 mg/dl — AB (ref 70–99)

## 2022-08-25 MED ORDER — FREESTYLE LIBRE 2 READER DEVI
1.0000 | 2 refills | Status: DC
Start: 2022-08-25 — End: 2023-02-18

## 2022-08-25 MED ORDER — LANTUS SOLOSTAR 100 UNIT/ML ~~LOC~~ SOPN: PEN_INJECTOR | SUBCUTANEOUS | 1 refills | Status: DC

## 2022-08-25 MED ORDER — FREESTYLE LIBRE 2 SENSOR MISC
1.0000 | 2 refills | Status: DC
Start: 2022-08-25 — End: 2022-10-05

## 2022-08-25 MED ORDER — INSULIN LISPRO (1 UNIT DIAL) 100 UNIT/ML (KWIKPEN): PEN_INJECTOR | SUBCUTANEOUS | 1 refills | Status: DC

## 2022-08-25 MED ORDER — INSULIN PEN NEEDLE 32G X 4 MM MISC
5 refills | Status: DC
Start: 2022-08-25 — End: 2022-08-25

## 2022-08-25 NOTE — Telephone Encounter (Signed)
Pt states he only received Humalog but both Humalog and Lantus were sent to his pharmacy. He will contact them regarding this. He was unsure of how to apply the sensor, advised it should come with an instruction manual which should include a pictorial or QR code for video tutorial.  A prescription was sent for insulin needles. Pt made aware.

## 2022-08-25 NOTE — Telephone Encounter (Signed)
Patient called and has some concerns regarding his diabetes. He has some questions about his medication and how he should go about taking it and which one he should take in regards to the Lantus.  Please give the patient a call to discuss and provide clarity for the patient.

## 2022-08-25 NOTE — Telephone Encounter (Signed)
Lantus sent today is not covered by insurance, alternative would be Semglee.   Please advise

## 2022-08-25 NOTE — Progress Notes (Signed)
OFFICE VISIT  08/25/2022  CC:  Chief Complaint  Patient presents with   Acute Visit    Frequent urination mainly at night. Denies any burning. He does have a skin tag on his back that he's concerned about.    Patient is a 57 y.o. male who presents for frequent urination at night.  HPI: Has had months of excessive thirst, excessive urination, and some fatigue. No dysuria.  No abd pain.  Appetite waxing and waning.  Dietary choices not healthy. No exercise. Feeling chronic neuropathy sx's in feet secondary to his L spine DDD.  On labs for CPE on 09/29/2021 his glucose was 115 and his hemoglobin A1c was 7.3%.  We made multiple attempts to contact him to discuss this.  Finally we sent him a letter.  ROS as above, plus--> no fevers, no CP, no SOB, no wheezing, no cough, no dizziness, no HAs, no rashes, no melena/hematochezia.   No myalgias or arthralgias.  No focal weakness, paresthesias, or tremors.  No acute vision or hearing abnormalities.   No recent changes in lower legs. No n/v/d or abd pain.  No palpitations.     Past Medical History:  Diagnosis Date   Achilles tendon disorder    bilat calcific tendonopathy (shockwave therapy, Dr. Jordan Likes)   Chronic low back pain    estab emergeortho 11/2020->DDD/postlaminectomy syndrome   Depression    GERD (gastroesophageal reflux disease)    History of adenomatous polyp of colon 2019   Recall 2024   Hyperlipidemia    rec'd statin 04/2020   Hypervitaminosis D 05/03/2020   oversupplementation (10K U tab qd for a couple yrs)   IBS (irritable bowel syndrome)    Insomnia    Migraine syndrome    Dr. Lucia Gaskins    Past Surgical History:  Procedure Laterality Date   COLONOSCOPY N/A 03/07/2018   2019 Adenoma x 1---recall 2024. Procedure: COLONOSCOPY;  Surgeon: West Bali, MD;  Location: [facility];  Service: Endoscopy;  Laterality: N/A;  10:30   ESOPHAGOGASTRODUODENOSCOPY  2005   LUMBAR EPIDURAL INJECTION  2014   L4-5   LUMBAR  LAMINECTOMY  2014   MOUTH SURGERY  2003   POLYPECTOMY  03/07/2018   Procedure: POLYPECTOMY;  Surgeon: West Bali, MD;  Location: [facility];  Service: Endoscopy;;  sigmoid    Outpatient Medications Prior to Visit  Medication Sig Dispense Refill   allopurinol (ZYLOPRIM) 100 MG tablet Take 100 mg by mouth daily.     BOTOX 100 units SOLR injection PROVIDER TO INJECT 155 UNITS INTO THE MUSCLES OF THE HEAD AND NECK EVERY 3 MONTHS. DISCARD REMAINDER. 200 Units 1   co-enzyme Q-10 50 MG capsule Take 200 mg by mouth daily.     colchicine 0.6 MG tablet Take 0.6 mg by mouth daily.     Cyanocobalamin (VITAMIN B-12 PO) Take by mouth.     cyclobenzaprine (FLEXERIL) 10 MG tablet Take 1 tablet (10 mg total) by mouth 3 (three) times daily as needed for muscle spasms. 90 tablet 1   loratadine (CLARITIN) 10 MG tablet Take 10 mg by mouth daily.     Magnesium 500 MG TABS Take 500 mg by mouth daily.     Multiple Vitamins-Minerals (ZINC PO) Take 12 mg by mouth.     naratriptan (AMERGE) 2.5 MG tablet Take one (1) tablet at onset of headache; if returns or does not resolve, may repeat after 4 hours; do not exceed five (5) mg in 24 hours. 9 tablet 2  prochlorperazine (COMPAZINE) 10 MG tablet Take 1 tablet (10 mg total) by mouth every 6 (six) hours as needed for nausea or vomiting. 30 tablet 0   traZODone (DESYREL) 50 MG tablet TAKE 1-4 TABLETS BY MOUTH AT BEDTIME AS NEEDED FOR INSOMNIA. 360 tablet 1   VITAMIN D PO Take 10,000 Int'l Units by mouth.     VITAMIN K PO Take 200 mg by mouth.     HYDROcodone bit-homatropine (HYCODAN) 5-1.5 MG/5ML syrup 1-2 tsp po bid prn cough 120 mL 0   predniSONE (STERAPRED UNI-PAK 21 TAB) 10 MG (21) TBPK tablet Taper as directed (Patient not taking: Reported on 08/25/2022) 1 each 0   No facility-administered medications prior to visit.    Allergies  Allergen Reactions   Amoxicillin    Penicillins    Pseudoephedrine Palpitations   Sulfa Antibiotics Rash   Wellbutrin  [Bupropion] Nausea Only, Anxiety and Other (See Comments)    Insomnia and paranoia    Review of Systems  As per HPI  PE:    08/25/2022   11:04 AM 04/02/2022    2:58 PM 02/23/2022    8:00 AM  Vitals with BMI  Height  5\' 8"  5\' 8"   Weight 249 lbs 267 lbs 3 oz 265 lbs 10 oz  BMI  40.64 40.39  Systolic 128 122 409  Diastolic 80 83 75  Pulse 93 97 87   Physical Exam  Gen: Alert, well appearing.  Patient is oriented to person, place, time, and situation. AFFECT: pleasant, lucid thought and speech. No further exam today.  LABS:  Last metabolic panel Lab Results  Component Value Date   GLUCOSE 115 (H) 09/29/2021   NA 139 09/29/2021   K 4.3 09/29/2021   CL 103 09/29/2021   CO2 26 09/29/2021   BUN 20 09/29/2021   CREATININE 0.98 09/29/2021   CALCIUM 9.0 09/29/2021   PROT 7.1 09/29/2021   ALBUMIN 4.5 09/29/2021   BILITOT 0.5 09/29/2021   ALKPHOS 86 09/29/2021   AST 18 09/29/2021   ALT 23 09/29/2021   Last hemoglobin A1c Lab Results  Component Value Date   HGBA1C 11.3 (A) 08/25/2022   HGBA1C 11.3 08/25/2022   HGBA1C 11.3 (A) 08/25/2022   HGBA1C 11.3 (A) 08/25/2022   POC Hba1c today is 11.3%   Random glucose today is 310 UA today: Positive for glucose and protein, otherwise normal.  IMPRESSION AND PLAN:  New diagnosis diabetes. Check insulin and C-peptide level as well as GAD antibody levels--> basic assessment to rule out LADA. Complete metabolic panel today. Referral to nutritionist today.  Prescription for continuous glucose monitor. Start insulin: Lantus 10 units nightly and titrate up 2 units every day until fasting glucose is 100-110. Mealtime insulin regimen: Give 5 units of humalog at each meal (this is your "base" dose): Add 2 units if pre-meal glucose is > 150. Add 4 units if pre-meal glucose is >200. Add 6 units if pre-meal glucose is >250. Add 8 units if premeal glucose is >300.  An After Visit Summary was printed and given to the patient.  Spent  48 min with pt today precharting, reviewing HPI, reviewing relevant past history, doing exam, reviewing and discussing lab data, formulating plans, and personally doing insulin regimen teaching.  FOLLOW UP: Return in about 1 week (around 09/01/2022) for f/u DM.  Signed:  Santiago Bumpers, MD           08/25/2022

## 2022-08-25 NOTE — Patient Instructions (Signed)
Take 10 units of lantus insulin every night around bedtime. Check fasting glucose every morning. Increase lantus by 2 units every day until your fasting glucose is in the 100-110 range.  Give 5 units of humalog at each meal (this is your "base" dose): Add 2 units if pre-meal glucose is > 150. Add 4 units if pre-meal glucose is >200. Add 6 units if pre-meal glucose is >250. Add 8 units if premeal glucose is >300  (For example, if your glucose is 180 before breakfast, give 5 units (your base) plus 2 units (glucose >150).  Goal glucose level 2 hours after a meal is 140-160 range Goal fasting glucose is 100.

## 2022-08-26 ENCOUNTER — Encounter: Payer: BC Managed Care – PPO | Attending: Family Medicine | Admitting: Registered"

## 2022-08-26 ENCOUNTER — Encounter: Payer: Self-pay | Admitting: Registered"

## 2022-08-26 ENCOUNTER — Other Ambulatory Visit (INDEPENDENT_AMBULATORY_CARE_PROVIDER_SITE_OTHER): Payer: BC Managed Care – PPO

## 2022-08-26 ENCOUNTER — Other Ambulatory Visit: Payer: Self-pay | Admitting: Family Medicine

## 2022-08-26 DIAGNOSIS — E119 Type 2 diabetes mellitus without complications: Secondary | ICD-10-CM | POA: Insufficient documentation

## 2022-08-26 DIAGNOSIS — R748 Abnormal levels of other serum enzymes: Secondary | ICD-10-CM | POA: Diagnosis not present

## 2022-08-26 LAB — GAMMA GT: GGT: 41 U/L (ref 7–51)

## 2022-08-26 MED ORDER — INSULIN GLARGINE-YFGN 100 UNIT/ML ~~LOC~~ SOPN
PEN_INJECTOR | SUBCUTANEOUS | 1 refills | Status: DC
Start: 2022-08-26 — End: 2022-10-01

## 2022-08-26 NOTE — Patient Instructions (Addendum)
Increase omega 3, consider eating fish 2-3x/week (salmon, tuna, mackerel) walnuts, chia seed, flaxseed, eggs if noted on container.  Consider getting a podiatrist appointment to look into shoes for diabetes.  Schedule an eye exam  Load up your plate with protein (meat) and vegetables, limit the starchy vegetables. Beans and Peas are good carbs to include. For fruit not too much and start with apples, pears and berries.  Continue having electrolyte drinks with   Include healthy fats to help prevent too much weight loss Avocado, nuts, olive oil

## 2022-08-26 NOTE — Progress Notes (Signed)
Diabetes Self-Management Education  Visit Type: First/Initial  Appt. Start Time: 1400 Appt. End Time: 1515  08/26/2022  [name], identified by name and date of birth, is a 57 y.o. male with a diagnosis of Diabetes: Type 2. (Diagnosed yesterday)  ASSESSMENT  There were no vitals taken for this visit. There is no height or weight on file to calculate BMI.  Lab Results  Component Value Date   HGBA1C 11.3 (A) 08/25/2022  Last year A1c was 7.3%  Pt states he has a family history of T1D including his father and daughter.  SMBG: Freestyle Libre 2 FBS: 211 this morning 310 reading without eating - pt was concerned, was expecting it to be lower since he started insulin yesterday. Readings appear to be staying in 200's.  Medications: Humalog quick pen, 11 units immediately before a meal, 5 u last night Semglee - 10 units will up to 12 tomorrow Vitamins: (Migraine) Magnesium takes when waking up in the afternoon (works night shifts) Vit D, Vit K,  B-12  Diet: Pt states he eats fish 1x month, likes it but just hasn't been eating it. Likes most vegetables. Drinks a lot of water. He works in 130 deg environment. Drinks 4+ cups of coffee daily, energy drinks when at work.  Pt reports his stress did not change over the last year. Pt states his family's baseline stress level is very high but he handles stress well. Pt states he works night shift and sometimes has his appointments at 8 am to have them after work, otherwise prefers afternoon appointments.  Physical Activity. tries to keep step count to not more than 13K at work due to bone spurs from walking on concrete. 2 yrs ago was going to gym 2-4 x/week. As soon as he is able plans to start going to the gym regularly.  Reviewed insulin injection techniques, SMBG Accu-chek Guide Me Lot #[id number] Exp: 7//19/25 CBG: 253 mg/dL    Diabetes Self-Management Education - 08/26/22 1400       Visit Information   Visit Type  First/Initial      Initial Visit   Diabetes Type Type 2    Date Diagnosed 08/25/22    Are you currently following a meal plan? No    Are you taking your medications as prescribed? Yes      Health Coping   How would you rate your overall health? Good      Psychosocial Assessment   Patient Belief/Attitude about Diabetes Motivated to manage diabetes    How often do you need to have someone help you when you read instructions, pamphlets, or other written materials from your doctor or pharmacy? 1 - Never    What is the last grade level you completed in school? 16      Complications   Last HgB A1C per patient/outside source 11.1 %    How often do you check your blood sugar? > 4 times/day    Fasting Blood glucose range (mg/dL) >045    Have you had a dilated eye exam in the past 12 months? No   Pt states he plans to get one scheduled   Have you had a dental exam in the past 12 months? Yes    Are you checking your feet? No      Dietary Intake   Breakfast pudding, coffee, no-sugar energy drink on work days    Snack (morning) meat stick, peanuts    Lunch fruit and sandwich on thin bread  Dinner Control and instrumentation engineer) water, 4 c black coffee, 3x/week no-sugar energy or dr pepper      Activity / Exercise   Activity / Exercise Type ADL's   active at work 13K steps     Patient Education   Previous Diabetes Education Yes (please comment)   daughter diagnosed with T1D     Individualized Goals (developed by patient)   Nutrition General guidelines for healthy choices and portions discussed    Medications take my medication as prescribed    Monitoring  Consistenly use CGM      Outcomes   Expected Outcomes Demonstrated interest in learning. Expect positive outcomes    Future DMSE PRN    Program Status Not Completed            Individualized Plan for Diabetes Self-Management Training:   Learning Objective:  Patient will have a greater understanding of diabetes  self-management. Patient education plan is to attend individual and/or group sessions per assessed needs and concerns.  Patient Instructions  Increase omega 3, consider eating fish 2-3x/week (salmon, tuna, mackerel) walnuts, chia seed, flaxseed, eggs if noted on container.  Consider getting a podiatrist appointment to look into shoes for diabetes.  Schedule an eye exam  Load up your plate with protein (meat) and vegetables, limit the starchy vegetables. Beans and Peas are good carbs to include. For fruit not too much and start with apples, pears and berries.  Continue having electrolyte drinks with (at work)  Include healthy fats to help prevent too much weight loss Avocado, nuts, olive oil  Expected Outcomes:  Demonstrated interest in learning. Expect positive outcomes  Education material provided: Hypoglycemia instructions  If problems or questions, patient to contact team via:  Phone and MyChart  Future DSME appointment: PRN

## 2022-08-27 ENCOUNTER — Telehealth: Payer: Self-pay

## 2022-08-27 DIAGNOSIS — E119 Type 2 diabetes mellitus without complications: Secondary | ICD-10-CM

## 2022-08-27 LAB — INSULIN AND C-PEPTIDE, SERUM
C-Peptide: 4.8 ng/mL — ABNORMAL HIGH (ref 1.1–4.4)
INSULIN: 17.2 u[IU]/mL (ref 2.6–24.9)

## 2022-08-27 MED ORDER — FREESTYLE PRECISION NEO TEST VI STRP: ORAL_STRIP | 12 refills | Status: DC

## 2022-08-27 NOTE — Telephone Encounter (Signed)
Rx sent to pharmacy   

## 2022-08-27 NOTE — Telephone Encounter (Signed)
[  7:51 AM] Earley Favor Pls order Freestyle precision neo blood glucose test strips for [facility].  The nutritionist sent me a message stating this is exactly what he needs.thx

## 2022-08-28 LAB — GLUTAMIC ACID DECARBOXYLASE AUTO ABS: Glutamic Acid Decarb Ab: <5 IU/mL (ref <5)

## 2022-08-31 ENCOUNTER — Encounter: Payer: BC Managed Care – PPO | Admitting: Registered"

## 2022-08-31 ENCOUNTER — Encounter: Payer: Self-pay | Admitting: Registered"

## 2022-08-31 DIAGNOSIS — E119 Type 2 diabetes mellitus without complications: Secondary | ICD-10-CM | POA: Diagnosis not present

## 2022-08-31 NOTE — Progress Notes (Signed)
Diabetes Self-Management Education  Visit Type: Follow-up  Appt. Start Time: 1405 Appt. End Time: 1505  08/31/2022  [name], identified by name and date of birth, is a 57 y.o. male with a diagnosis of Diabetes: Type 2.   ASSESSMENT  There were no vitals taken for this visit. There is no height or weight on file to calculate BMI.  Pt states he is highly motivated to get diabetes controlled and wants to stop progression of any complications that may have started.   Pt states he had tuna, asparagus, spaghetti squash for supper and was good for blood sugar but he was hungry soon after. Pt states the breakfast of 2 eggs, avocado, and salsa was satisfying.   Pt states since getting control of his blood sugar his skin has gotten better immediately, more energy, less urination and less achy. Pt state his weight has stabilized.  Pt states during his 12-hr night shift he was grazing on the lunch he brought eating 3 times. Pt states he has switched to just one small snack and eating most of the food at one sitting.    Diabetes Self-Management Education - 08/31/22 1707       Visit Information   Visit Type Follow-up      Initial Visit   Diabetes Type Type 2      Complications   Fasting Blood glucose range (mg/dL) 14-782    Postprandial Blood glucose range (mg/dL) 95-621;308-657    Number of hypoglycemic episodes per month 0      Dietary Intake   Snack (morning) meat stick    Lunch sandwich, apple, mixed nuts, protein bar      Patient Education   Healthy Eating Role of diet in the treatment of diabetes and the relationship between the three main macronutrients and blood glucose level;Food label reading, portion sizes and measuring food.;Plate Method;Carbohydrate counting;Reviewed blood glucose goals for pre and post meals and how to evaluate the patients' food intake on their blood glucose level.    Being Active Role of exercise on diabetes management, blood pressure  control and cardiac health.    Medications Reviewed patients medication for diabetes, action, purpose, timing of dose and side effects.    Monitoring Purpose and frequency of SMBG.;Taught/evaluated CGM (comment)      Individualized Goals (developed by patient)   Nutrition General guidelines for healthy choices and portions discussed;Other (comment)   focus on protein and build balance from there   Physical Activity Exercise 3-5 times per week    Medications take my medication as prescribed    Monitoring  Consistenly use CGM      Patient Self-Evaluation of Goals - Patient rates self as meeting previously set goals (% of time)   Nutrition >75% (most of the time)    Medications >75% (most of the time)    Monitoring >75% (most of the time)      Outcomes   Expected Outcomes Demonstrated interest in learning. Expect positive outcomes    Future DMSE PRN    Program Status Completed      Subsequent Visit   Since your last visit have you continued or begun to take your medications as prescribed? Yes             Individualized Plan for Diabetes Self-Management Training:   Learning Objective:  Patient will have a greater understanding of diabetes self-management. Patient education plan is to attend individual and/or group sessions per assessed needs and concerns.  Patient Instructions  Randie Heinz  job on experimenting with different foods and evaluating their effect using your CGM. Remember to pick up strips that work with your reader for times when you are prompted to check your blood sugar.  Consider taking meal bolus 10-15 min prior to eating When you are at the 4th of July cookout consider taking insulin a couple of times based on if you eat a couple of separate sittings during the day.  Continue using CGM and switch to phone app with next sensor.  Continue being active.  Continue cooking meals at home and use handout for ideas to put together balanced meals. Aim to get around 100 grams  of protein spread out throughout the day. Aim for 3 servings of  non-starchy vegetables. Include complex carbs for energy source and nutrition.  Start paying attention to food labels, especially total carbs, fiber, added sugar and protein.  Expected Outcomes:  Demonstrated interest in learning. Expect positive outcomes  Education material provided: A1C conversion sheet, Planning Healthy Meals  If problems or questions, patient to contact team via:  Phone and MyPlate  Future DSME appointment: PRN

## 2022-08-31 NOTE — Patient Instructions (Addendum)
Great job on experimenting with different foods and evaluating their effect using your CGM. Remember to pick up strips that work with your reader for times when you are prompted to check your blood sugar.  Consider taking meal bolus 10-15 min prior to eating When you are at the 4th of July cookout consider taking insulin a couple of times based on if you eat a couple of separate sittings during the day.  Continue using CGM and switch to phone app with next sensor.  Continue being active.  Continue cooking meals at home and use handout for ideas to put together balanced meals. Aim to get around 100 grams of protein spread out throughout the day. Aim for 3 servings of  non-starchy vegetables. Include complex carbs for energy source and nutrition.  Start paying attention to food labels, especially total carbs, fiber, added sugar and protein.

## 2022-09-03 NOTE — Patient Instructions (Signed)

## 2022-09-04 ENCOUNTER — Ambulatory Visit: Payer: BC Managed Care – PPO | Admitting: Family Medicine

## 2022-09-04 ENCOUNTER — Encounter: Payer: Self-pay | Admitting: Family Medicine

## 2022-09-04 VITALS — BP 115/80 | HR 82 | Wt 250.4 lb

## 2022-09-04 DIAGNOSIS — E119 Type 2 diabetes mellitus without complications: Secondary | ICD-10-CM

## 2022-09-04 DIAGNOSIS — Z9641 Presence of insulin pump (external) (internal): Secondary | ICD-10-CM | POA: Diagnosis not present

## 2022-09-04 DIAGNOSIS — Z23 Encounter for immunization: Secondary | ICD-10-CM

## 2022-09-04 DIAGNOSIS — Z794 Long term (current) use of insulin: Secondary | ICD-10-CM

## 2022-09-04 LAB — MICROALBUMIN / CREATININE URINE RATIO
Creatinine,U: 114.6 mg/dL
Microalb Creat Ratio: 0.6 mg/g (ref 0.0–30.0)
Microalb, Ur: <0.7 mg/dL (ref 0.0–1.9)

## 2022-09-04 LAB — CBC
HCT: 44.7 % (ref 39.0–52.0)
Hemoglobin: 15 g/dL (ref 13.0–17.0)
MCHC: 33.5 g/dL (ref 30.0–36.0)
MCV: 84.9 fl (ref 78.0–100.0)
Platelets: 303 10*3/uL (ref 150.0–400.0)
RBC: 5.26 Mil/uL (ref 4.22–5.81)
RDW: 14 % (ref 11.5–15.5)
WBC: 6.8 10*3/uL (ref 4.0–10.5)

## 2022-09-04 LAB — LIPID PANEL
Cholesterol: 217 mg/dL — ABNORMAL HIGH (ref 0–200)
HDL: 40.6 mg/dL (ref >39.00)
NonHDL: 176.75
Total CHOL/HDL Ratio: 5
Triglycerides: 222 mg/dL — ABNORMAL HIGH (ref 0.0–149.0)
VLDL: 44.4 mg/dL — ABNORMAL HIGH (ref 0.0–40.0)

## 2022-09-04 LAB — TSH: TSH: 3.88 u[IU]/mL (ref 0.35–5.50)

## 2022-09-04 LAB — LDL CHOLESTEROL, DIRECT: Direct LDL: 158 mg/dL

## 2022-09-04 NOTE — Progress Notes (Signed)
OFFICE VISIT  09/04/2022  CC:  Chief Complaint  Patient presents with   Follow-up    1 week follow up. He has some concerns about his finger nail on his left hand that may be infected.    Patient is a 57 y.o. male who presents for 10-day follow-up diabetes. A/P as of last visit: "New diagnosis diabetes. Check insulin and C-peptide level as well as GAD antibody levels--> basic assessment to rule out LADA. Complete metabolic panel today. Referral to nutritionist today.  Prescription for continuous glucose monitor. Start insulin: Lantus 10 units nightly and titrate up 2 units every day until fasting glucose is 100-110. Mealtime insulin regimen: Give 5 units of humalog at each meal (this is your "base" dose): Add 2 units if pre-meal glucose is > 150. Add 4 units if pre-meal glucose is >200. Add 6 units if pre-meal glucose is >250. Add 8 units if premeal glucose is >300."  INTERIM HX: Carlos Ford is doing very well.  No more polyuria or polydipsia.  No more fatigue and achiness.  No more brain fog. He is giving 18 units of Semglee once daily and typically 5 units of his Humalog at mealtime.  He eats 2 meals a day. He has had 2 low sugars. For the most part his glucoses are now in the 100-140 range throughout his day. He has met with the dietitian a couple of times and has been making great dietary changes.  He has chronic decreased sensation in both feet due to degenerative disc disease with postlaminectomy syndrome. Past Medical History:  Diagnosis Date   Achilles tendon disorder    bilat calcific tendonopathy (shockwave therapy, Dr. Jordan Likes)   Chronic low back pain    estab emergeortho 11/2020->DDD/postlaminectomy syndrome   Depression    GERD (gastroesophageal reflux disease)    History of adenomatous polyp of colon 2019   Recall 2024   Hyperlipidemia    rec'd statin 04/2020   Hypervitaminosis D 05/03/2020   oversupplementation (10K U tab qd for a couple yrs)   IBS (irritable bowel  syndrome)    Insomnia    Migraine syndrome    Dr. Lucia Gaskins    Past Surgical History:  Procedure Laterality Date   COLONOSCOPY N/A 03/07/2018   2019 Adenoma x 1---recall 2024. Procedure: COLONOSCOPY;  Surgeon: West Bali, MD;  Location: [facility];  Service: Endoscopy;  Laterality: N/A;  10:30   ESOPHAGOGASTRODUODENOSCOPY  2005   LUMBAR EPIDURAL INJECTION  2014   L4-5   LUMBAR LAMINECTOMY  2014   MOUTH SURGERY  2003   POLYPECTOMY  03/07/2018   Procedure: POLYPECTOMY;  Surgeon: West Bali, MD;  Location: [facility];  Service: Endoscopy;;  sigmoid    Outpatient Medications Prior to Visit  Medication Sig Dispense Refill   allopurinol (ZYLOPRIM) 100 MG tablet Take 100 mg by mouth daily.     BOTOX 100 units SOLR injection PROVIDER TO INJECT 155 UNITS INTO THE MUSCLES OF THE HEAD AND NECK EVERY 3 MONTHS. DISCARD REMAINDER. 200 Units 1   co-enzyme Q-10 50 MG capsule Take 200 mg by mouth daily.     colchicine 0.6 MG tablet Take 0.6 mg by mouth daily.     Continuous Glucose Receiver (FREESTYLE LIBRE 2 READER) DEVI 1 each by Does not apply route every 14 (fourteen) days. 2 each 2   Continuous Glucose Sensor (FREESTYLE LIBRE 2 SENSOR) MISC 1 each by Does not apply route every 14 (fourteen) days. 2 each 2   Cyanocobalamin (  VITAMIN B-12 PO) Take by mouth.     cyclobenzaprine (FLEXERIL) 10 MG tablet Take 1 tablet (10 mg total) by mouth 3 (three) times daily as needed for muscle spasms. 90 tablet 1   glucose blood (FREESTYLE PRECISION NEO TEST) test strip Use to check sugar 1-2 times daily. 100 each 12   insulin glargine-yfgn (SEMGLEE) 100 UNIT/ML Pen 10 U SQ qhs and gradually increase dose as per provider instructions 15 mL 1   insulin lispro (HUMALOG KWIKPEN) 100 UNIT/ML KwikPen 10 U qAC tid 15 mL 1   loratadine (CLARITIN) 10 MG tablet Take 10 mg by mouth daily.     Magnesium 500 MG TABS Take 500 mg by mouth daily.     naratriptan (AMERGE) 2.5 MG tablet Take one (1) tablet at onset  of headache; if returns or does not resolve, may repeat after 4 hours; do not exceed five (5) mg in 24 hours. 9 tablet 2   prochlorperazine (COMPAZINE) 10 MG tablet Take 1 tablet (10 mg total) by mouth every 6 (six) hours as needed for nausea or vomiting. 30 tablet 0   traZODone (DESYREL) 50 MG tablet TAKE 1-4 TABLETS BY MOUTH AT BEDTIME AS NEEDED FOR INSOMNIA. 360 tablet 1   VITAMIN D PO Take 10,000 Int'l Units by mouth.     VITAMIN K PO Take 200 mg by mouth.     Insulin Pen Needle (BD PEN NEEDLE NANO U/F) 32G X 4 MM MISC USE TO ADMINISTER LANTUS ONCE DAILY AND HUMALOG THREE TIMES DAILY. 100 each 5   Multiple Vitamins-Minerals (ZINC PO) Take 12 mg by mouth. (Patient not taking: Reported on 08/26/2022)     No facility-administered medications prior to visit.    Allergies  Allergen Reactions   Amoxicillin    Penicillins    Pseudoephedrine Palpitations   Sulfa Antibiotics Rash   Wellbutrin [Bupropion] Nausea Only, Anxiety and Other (See Comments)    Insomnia and paranoia    Review of Systems As per HPI  PE:    09/04/2022    8:29 AM 08/25/2022   11:04 AM 04/02/2022    2:58 PM  Vitals with BMI  Height   5\' 8"   Weight 250 lbs 6 oz 249 lbs 267 lbs 3 oz  BMI   40.64  Systolic 115 128 573  Diastolic 80 80 83  Pulse 82 93 97    Physical Exam  Gen: Alert, well appearing.  Patient is oriented to person, place, time, and situation. Foot exam -  no swelling, tenderness or skin or vascular lesions. Color and temperature is normal.  Decreased sensation to monofilament is noted throughout both feet .peripheral pulses are palpable. Toenails are normal.   LABS:  Last CBC Lab Results  Component Value Date   WBC 6.2 09/29/2021   HGB 14.9 09/29/2021   HCT 45.0 09/29/2021   MCV 86.5 09/29/2021   RDW 14.4 09/29/2021   PLT 248.0 09/29/2021   Last metabolic panel Lab Results  Component Value Date   GLUCOSE 322 (H) 08/25/2022   NA 136 08/25/2022   K 4.3 08/25/2022   CL 97 08/25/2022    CO2 29 08/25/2022   BUN 14 08/25/2022   CREATININE 1.03 08/25/2022   CALCIUM 9.6 08/25/2022   PROT 7.9 08/25/2022   ALBUMIN 4.6 08/25/2022   BILITOT 0.6 08/25/2022   ALKPHOS 123 (H) 08/25/2022   AST 16 08/25/2022   ALT 26 08/25/2022   Last lipids Lab Results  Component Value Date   CHOL 236 (  H) 09/29/2021   HDL 41.90 09/29/2021   LDLDIRECT 151.0 09/29/2021   TRIG 335.0 (H) 09/29/2021   CHOLHDL 6 09/29/2021   Last hemoglobin A1c Lab Results  Component Value Date   HGBA1C 11.3 (A) 08/25/2022   HGBA1C 11.3 08/25/2022   HGBA1C 11.3 (A) 08/25/2022   HGBA1C 11.3 (A) 08/25/2022   Last thyroid functions Lab Results  Component Value Date   TSH 3.03 09/29/2021   IMPRESSION AND PLAN:  #1 new diagnosis diabetes.  LADA labs were negative. He is doing excellent with dietary changes and basal plus bolus insulin regimen. Discussed potential long-term complications from diabetes and the standard monitoring for these. Check urine microalbumin/creatinine today.  Feet exam normal today except chronic decreased sensation due to his lumbar spinal neuropathy. Check cholesterol today, discussed the need to be aggressive with this problem. Check CBC and TSH today.  An After Visit Summary was printed and given to the patient.  FOLLOW UP: No follow-ups on file.  Signed:  Santiago Bumpers, MD           09/04/2022

## 2022-09-07 ENCOUNTER — Other Ambulatory Visit: Payer: Self-pay | Admitting: Family Medicine

## 2022-09-07 MED ORDER — ATORVASTATIN CALCIUM 20 MG PO TABS: 20.0000 mg | ORAL_TABLET | Freq: Every day | ORAL | 2 refills | Status: DC

## 2022-09-08 DIAGNOSIS — H5203 Hypermetropia, bilateral: Secondary | ICD-10-CM | POA: Diagnosis not present

## 2022-09-08 DIAGNOSIS — E113293 Type 2 diabetes mellitus with mild nonproliferative diabetic retinopathy without macular edema, bilateral: Secondary | ICD-10-CM | POA: Diagnosis not present

## 2022-09-08 LAB — HM DIABETES EYE EXAM

## 2022-09-28 NOTE — Patient Instructions (Signed)

## 2022-09-29 DIAGNOSIS — M9901 Segmental and somatic dysfunction of cervical region: Secondary | ICD-10-CM | POA: Diagnosis not present

## 2022-09-29 DIAGNOSIS — M9904 Segmental and somatic dysfunction of sacral region: Secondary | ICD-10-CM | POA: Diagnosis not present

## 2022-09-29 DIAGNOSIS — M9902 Segmental and somatic dysfunction of thoracic region: Secondary | ICD-10-CM | POA: Diagnosis not present

## 2022-09-30 DIAGNOSIS — M9903 Segmental and somatic dysfunction of lumbar region: Secondary | ICD-10-CM | POA: Diagnosis not present

## 2022-09-30 DIAGNOSIS — M9905 Segmental and somatic dysfunction of pelvic region: Secondary | ICD-10-CM | POA: Diagnosis not present

## 2022-09-30 DIAGNOSIS — M9904 Segmental and somatic dysfunction of sacral region: Secondary | ICD-10-CM | POA: Diagnosis not present

## 2022-10-01 ENCOUNTER — Ambulatory Visit (INDEPENDENT_AMBULATORY_CARE_PROVIDER_SITE_OTHER): Payer: BC Managed Care – PPO | Admitting: Family Medicine

## 2022-10-01 ENCOUNTER — Encounter: Payer: Self-pay | Admitting: Family Medicine

## 2022-10-01 VITALS — BP 115/81 | HR 78 | Ht 70.0 in | Wt 245.0 lb

## 2022-10-01 DIAGNOSIS — E119 Type 2 diabetes mellitus without complications: Secondary | ICD-10-CM

## 2022-10-01 DIAGNOSIS — E7800 Pure hypercholesterolemia, unspecified: Secondary | ICD-10-CM

## 2022-10-01 DIAGNOSIS — Z9189 Other specified personal risk factors, not elsewhere classified: Secondary | ICD-10-CM

## 2022-10-01 DIAGNOSIS — Z0001 Encounter for general adult medical examination with abnormal findings: Secondary | ICD-10-CM

## 2022-10-01 DIAGNOSIS — L918 Other hypertrophic disorders of the skin: Secondary | ICD-10-CM

## 2022-10-01 DIAGNOSIS — Z794 Long term (current) use of insulin: Secondary | ICD-10-CM

## 2022-10-01 DIAGNOSIS — Z0000 Encounter for general adult medical examination without abnormal findings: Secondary | ICD-10-CM

## 2022-10-01 MED ORDER — INSULIN GLARGINE-YFGN 100 UNIT/ML ~~LOC~~ SOPN: PEN_INJECTOR | SUBCUTANEOUS | 1 refills | Status: DC

## 2022-10-01 MED ORDER — INSULIN LISPRO (1 UNIT DIAL) 100 UNIT/ML (KWIKPEN): PEN_INJECTOR | SUBCUTANEOUS | 1 refills | Status: DC

## 2022-10-01 MED ORDER — TRAZODONE HCL 50 MG PO TABS: ORAL_TABLET | ORAL | 1 refills | Status: DC

## 2022-10-01 NOTE — Progress Notes (Signed)
Office Note 10/01/2022  CC:  Chief Complaint  Patient presents with   Annual Exam   Patient is a 57 y.o. male who is here for annual health maintenance exam and 1 month follow-up diabetes and hypercholesterolemia. A/P as of last visit: "#1 new diagnosis diabetes.  LADA labs were negative. He is doing excellent with dietary changes and basal plus bolus insulin regimen. Discussed potential long-term complications from diabetes and the standard monitoring for these. Check urine microalbumin/creatinine today.  Feet exam normal today except chronic decreased sensation due to his lumbar spinal neuropathy. Check cholesterol today, discussed the need to be aggressive with this problem. Check CBC and TSH today."  INTERIM HX: And he feels well other than his chronic back pain. He continues to make great strides in good diabetic diet.  He has been able to exercise much over the last week because of exacerbation of his back pain. His glucoses had been in the 90s consistently on 18 units of Semglee daily and 5 units of Humalog with each meal.  Over the last week they have crept up a little bit to 100-110 range.  09/04/2022 labs showed total cholesterol 217, triglycerides 222, HDL 41, total cholesterol/HDL ratio of 5, and direct LDL 158. The remainder of his labs were normal ( tsh, cbc, and urine microalb/cr). CMET normal on 08/25/2022.  Hemoglobin A1c was 11.3 on 08/25/2022. This was a new diagnosis of diabetes. We started him on basal plus bolus insulin at that time.  I recommended he start atorvastatin and I prescribed it but he chose to not take this medication because he was on a statin in the past and had diffuse myalgias and fatigue.  He has a skin tag on his left mid back region that he would like removed.  Past Medical History:  Diagnosis Date   Achilles tendon disorder    bilat calcific tendonopathy (shockwave therapy, Dr. Jordan Likes)   Chronic low back pain    estab emergeortho  11/2020->DDD/postlaminectomy syndrome   Depression    GERD (gastroesophageal reflux disease)    History of adenomatous polyp of colon 2019   Recall 2024   Hyperlipidemia    rec'd statin 04/2020   Hypervitaminosis D 05/03/2020   oversupplementation (10K U tab qd for a couple yrs)   IBS (irritable bowel syndrome)    Insomnia    Migraine syndrome    Dr. Lucia Gaskins    Past Surgical History:  Procedure Laterality Date   COLONOSCOPY N/A 03/07/2018   2019 Adenoma x 1---recall 2024. Procedure: COLONOSCOPY;  Surgeon: West Bali, MD;  Location: [facility];  Service: Endoscopy;  Laterality: N/A;  10:30   ESOPHAGOGASTRODUODENOSCOPY  2005   LUMBAR EPIDURAL INJECTION  2014   L4-5   LUMBAR LAMINECTOMY  2014   MOUTH SURGERY  2003   POLYPECTOMY  03/07/2018   Procedure: POLYPECTOMY;  Surgeon: West Bali, MD;  Location: [facility];  Service: Endoscopy;;  sigmoid    Family History  Problem Relation Age of Onset   Migraines Mother    Arthritis Mother    Diabetes Father    CAD Father    Heart attack Father    Migraines Sister    Migraines Sister    Diabetes Daughter    Cancer Maternal Grandmother    Early death Maternal Grandfather    Colon cancer Neg Hx    Colon polyps Neg Hx     Social History   Socioeconomic History   Marital status: Married  Spouse name: Not on file   Number of children: Not on file   Years of education: Not on file   Highest education level: Not on file  Occupational History   Occupation: Administrator, arts  Tobacco Use   Smoking status: Never   Smokeless tobacco: Former  Building services engineer status: Never Used  Substance and Sexual Activity   Alcohol use: Yes    Comment: occasional   Drug use: No   Sexual activity: Yes  Other Topics Concern   Not on file  Social History Narrative   Married, 3 daughters.     Orig from [place], relocated to [facility] 2018.   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   No alc.   No drugs.    Hobby: fishing   Social Determinants of Corporate investment banker Strain: Not on file  Food Insecurity: No Food Insecurity (08/26/2022)   Hunger Vital Sign    Worried About Running Out of Food in the Last Year: Never true    Ran Out of Food in the Last Year: Never true  Transportation Needs: Not on file  Physical Activity: Not on file  Stress: Not on file  Social Connections: Not on file  Intimate Partner Violence: Not on file    Outpatient Medications Prior to Visit  Medication Sig Dispense Refill   BOTOX 100 units SOLR injection PROVIDER TO INJECT 155 UNITS INTO THE MUSCLES OF THE HEAD AND NECK EVERY 3 MONTHS. DISCARD REMAINDER. 200 Units 1   co-enzyme Q-10 50 MG capsule Take 200 mg by mouth daily.     Continuous Glucose Receiver (FREESTYLE LIBRE 2 READER) DEVI 1 each by Does not apply route every 14 (fourteen) days. 2 each 2   Continuous Glucose Sensor (FREESTYLE LIBRE 2 SENSOR) MISC 1 each by Does not apply route every 14 (fourteen) days. 2 each 2   Cyanocobalamin (VITAMIN B-12 PO) Take by mouth.     cyclobenzaprine (FLEXERIL) 10 MG tablet Take 1 tablet (10 mg total) by mouth 3 (three) times daily as needed for muscle spasms. 90 tablet 1   glucose blood (FREESTYLE PRECISION NEO TEST) test strip Use to check sugar 1-2 times daily. 100 each 12   insulin glargine-yfgn (SEMGLEE) 100 UNIT/ML Pen 10 U SQ qhs and gradually increase dose as per provider instructions 15 mL 1   insulin lispro (HUMALOG KWIKPEN) 100 UNIT/ML KwikPen 10 U qAC tid 15 mL 1   Insulin Pen Needle (BD PEN NEEDLE NANO U/F) 32G X 4 MM MISC USE TO ADMINISTER LANTUS ONCE DAILY AND HUMALOG THREE TIMES DAILY. 100 each 5   loratadine (CLARITIN) 10 MG tablet Take 10 mg by mouth daily.     Magnesium 500 MG TABS Take 500 mg by mouth daily.     Multiple Vitamins-Minerals (ZINC PO) Take 12 mg by mouth.     naratriptan (AMERGE) 2.5 MG tablet Take one (1) tablet at onset of headache; if returns or does not resolve, may repeat  after 4 hours; do not exceed five (5) mg in 24 hours. 9 tablet 2   prochlorperazine (COMPAZINE) 10 MG tablet Take 1 tablet (10 mg total) by mouth every 6 (six) hours as needed for nausea or vomiting. 30 tablet 0   traZODone (DESYREL) 50 MG tablet TAKE 1-4 TABLETS BY MOUTH AT BEDTIME AS NEEDED FOR INSOMNIA. 360 tablet 1   VITAMIN D PO Take 10,000 Int'l Units by mouth.     VITAMIN K PO Take  200 mg by mouth.     allopurinol (ZYLOPRIM) 100 MG tablet Take 100 mg by mouth daily.     atorvastatin (LIPITOR) 20 MG tablet Take 1 tablet (20 mg total) by mouth daily. 30 tablet 2   colchicine 0.6 MG tablet Take 0.6 mg by mouth daily.     No facility-administered medications prior to visit.    Allergies  Allergen Reactions   Amoxicillin    Penicillins    Pseudoephedrine Palpitations   Sulfa Antibiotics Rash   Wellbutrin [Bupropion] Nausea Only, Anxiety and Other (See Comments)    Insomnia and paranoia    Review of Systems  Constitutional:  Negative for appetite change, chills, fatigue and fever.  HENT:  Negative for congestion, dental problem, ear pain and sore throat.   Eyes:  Negative for discharge, redness and visual disturbance.  Respiratory:  Negative for cough, chest tightness, shortness of breath and wheezing.   Cardiovascular:  Negative for chest pain, palpitations and leg swelling.  Gastrointestinal:  Negative for abdominal pain, blood in stool, diarrhea, nausea and vomiting.  Genitourinary:  Negative for difficulty urinating, dysuria, flank pain, frequency, hematuria and urgency.  Musculoskeletal:  Negative for arthralgias, back pain, joint swelling, myalgias and neck stiffness.  Skin:  Negative for pallor and rash.  Neurological:  Negative for dizziness, speech difficulty, weakness and headaches.  Hematological:  Negative for adenopathy. Does not bruise/bleed easily.  Psychiatric/Behavioral:  Negative for confusion and sleep disturbance. The patient is not nervous/anxious.      PE;    10/01/2022    2:53 PM 09/04/2022    8:29 AM 08/25/2022   11:04 AM  Vitals with BMI  Height 5\' 10"     Weight 245 lbs 250 lbs 6 oz 249 lbs  BMI 35.15    Systolic 115 115 540  Diastolic 81 80 80  Pulse 78 82 93    Gen: Alert, well appearing.  Patient is oriented to person, place, time, and situation. AFFECT: pleasant, lucid thought and speech. ENT: Ears: EACs clear, normal epithelium.  TMs with good light reflex and landmarks bilaterally.  Eyes: no injection, icteris, swelling, or exudate.  EOMI, PERRLA. Nose: no drainage or turbinate edema/swelling.  No injection or focal lesion.  Mouth: lips without lesion/swelling.  Oral mucosa pink and moist.  Dentition intact and without obvious caries or gingival swelling.  Oropharynx without erythema, exudate, or swelling.  Neck: supple/nontender.  No LAD, mass, or TM.  Carotid pulses 2+ bilaterally, without bruits. CV: RRR, no m/r/g.   LUNGS: CTA bilat, nonlabored resps, good aeration in all lung fields. ABD: soft, NT, ND, BS normal.  No hepatospenomegaly or mass.  No bruits. EXT: no clubbing, cyanosis, or edema.  Musculoskeletal: no joint swelling, erythema, warmth, or tenderness.  ROM of all joints intact. Skin - no sores or suspicious lesions or rashes or color changes.   He does have 1 pink skin tag/acrochordon on the right mid back region.  Pertinent labs:  Lab Results  Component Value Date   TSH 3.88 09/04/2022   Lab Results  Component Value Date   WBC 6.8 09/04/2022   HGB 15.0 09/04/2022   HCT 44.7 09/04/2022   MCV 84.9 09/04/2022   PLT 303.0 09/04/2022   Lab Results  Component Value Date   CREATININE 1.03 08/25/2022   BUN 14 08/25/2022   NA 136 08/25/2022   K 4.3 08/25/2022   CL 97 08/25/2022   CO2 29 08/25/2022   Lab Results  Component Value Date  ALT 26 08/25/2022   AST 16 08/25/2022   ALKPHOS 123 (H) 08/25/2022   BILITOT 0.6 08/25/2022   Lab Results  Component Value Date   CHOL 217 (H) 09/04/2022    Lab Results  Component Value Date   HDL 40.60 09/04/2022   Lab Results  Component Value Date   TRIG 222.0 (H) 09/04/2022   Lab Results  Component Value Date   CHOLHDL 5 09/04/2022   Lab Results  Component Value Date   PSA 2.36 09/29/2021   PSA 1.98 05/02/2020   Lab Results  Component Value Date   HGBA1C 11.3 (A) 08/25/2022   HGBA1C 11.3 08/25/2022   HGBA1C 11.3 (A) 08/25/2022   HGBA1C 11.3 (A) 08/25/2022   ASSESSMENT AND PLAN:   #1 health maintenance exam: Reviewed age and gender appropriate health maintenance issues (prudent diet, regular exercise, health risks of tobacco and excessive alcohol, use of seatbelts, fire alarms in home, use of sunscreen).  Also reviewed age and gender appropriate health screening as well as vaccine recommendations. Vaccines: Shingrix-->deferred.  Otherwise UTD. Labs: PSA (CBC, cmet, lipids, TSH, and A1c all done recently.  See hpi for details). Prostate ca screening: PSA today Colon ca screening: History of polyps 2019.  Due for recall this year.  #2 diabetes without complication. Control is much improved since he has gotten on a good diabetic diet and started basal and bolus insulin regimen. Continue Semglee 18 units a day and Humalog 5 units before every meal.   Next A1c after 11/25/22.  #3 intermediate risk coronary artery disease. We discussed statin and diabetes.  He understands there is benefit from this medication but his past experience on his statin was miserable.  He prefers to hold off at this time. He is strongly in favor of getting a coronary calcium score, though.  Ordered today.  #4 acrochordon /skin tag excision: (right mid back, 2 mm). Procedure: skin tag removal.  Consent obtained.  Area prepped and draped after being infiltrated with 1/2 cc of 1% lidocaine with epi.  Sterile technique utilized to excise the lesion entirely at its base with #10 scalpel.  No wound closure needed.  No immediate complications.  Pt tolerated  procedure well. Specimen discarded.  Wound care instructions discussed.  An After Visit Summary was printed and given to the patient.  FOLLOW UP:  No follow-ups on file.  Signed:  Santiago Bumpers, MD           10/01/2022

## 2022-10-04 ENCOUNTER — Other Ambulatory Visit: Payer: Self-pay | Admitting: Family Medicine

## 2022-10-04 DIAGNOSIS — E119 Type 2 diabetes mellitus without complications: Secondary | ICD-10-CM

## 2022-10-05 ENCOUNTER — Other Ambulatory Visit: Payer: BC Managed Care – PPO

## 2022-10-06 DIAGNOSIS — M9905 Segmental and somatic dysfunction of pelvic region: Secondary | ICD-10-CM | POA: Diagnosis not present

## 2022-10-06 DIAGNOSIS — M9903 Segmental and somatic dysfunction of lumbar region: Secondary | ICD-10-CM | POA: Diagnosis not present

## 2022-10-12 ENCOUNTER — Ambulatory Visit (HOSPITAL_BASED_OUTPATIENT_CLINIC_OR_DEPARTMENT_OTHER)
Admission: RE | Admit: 2022-10-12 | Discharge: 2022-10-12 | Disposition: A | Discharge status: Home / Self Care | Payer: BC Managed Care – PPO | Source: Ambulatory Visit | Attending: Family Medicine | Admitting: Family Medicine

## 2022-10-12 DIAGNOSIS — Z9189 Other specified personal risk factors, not elsewhere classified: Secondary | ICD-10-CM | POA: Insufficient documentation

## 2022-10-13 ENCOUNTER — Encounter: Payer: Self-pay | Admitting: Family Medicine

## 2022-10-15 ENCOUNTER — Ambulatory Visit
Admission: RE | Admit: 2022-10-15 | Discharge: 2022-10-15 | Disposition: A | Discharge status: Home / Self Care | Payer: BC Managed Care – PPO | Source: Ambulatory Visit | Attending: *Deleted | Admitting: *Deleted

## 2022-10-15 DIAGNOSIS — M47816 Spondylosis without myelopathy or radiculopathy, lumbar region: Secondary | ICD-10-CM | POA: Diagnosis not present

## 2022-10-15 DIAGNOSIS — M5431 Sciatica, right side: Secondary | ICD-10-CM

## 2022-10-15 DIAGNOSIS — M961 Postlaminectomy syndrome, not elsewhere classified: Secondary | ICD-10-CM

## 2022-10-15 DIAGNOSIS — M5432 Sciatica, left side: Secondary | ICD-10-CM

## 2022-10-15 DIAGNOSIS — M5416 Radiculopathy, lumbar region: Secondary | ICD-10-CM

## 2022-10-15 DIAGNOSIS — M545 Low back pain, unspecified: Secondary | ICD-10-CM | POA: Diagnosis not present

## 2022-10-15 MED ORDER — GADOPICLENOL 0.5 MMOL/ML IV SOLN
10.0000 mL | Freq: Once | INTRAVENOUS | Status: AC | PRN
  Administered 2022-10-15: 10 mL via INTRAVENOUS

## 2022-10-18 ENCOUNTER — Encounter: Payer: Self-pay | Admitting: Family Medicine

## 2022-10-19 ENCOUNTER — Ambulatory Visit: Payer: BC Managed Care – PPO | Admitting: Registered"

## 2022-10-19 DIAGNOSIS — M9903 Segmental and somatic dysfunction of lumbar region: Secondary | ICD-10-CM | POA: Diagnosis not present

## 2022-10-19 DIAGNOSIS — M9905 Segmental and somatic dysfunction of pelvic region: Secondary | ICD-10-CM | POA: Diagnosis not present

## 2022-10-19 DIAGNOSIS — M9904 Segmental and somatic dysfunction of sacral region: Secondary | ICD-10-CM | POA: Diagnosis not present

## 2022-10-19 MED ORDER — NARATRIPTAN HCL 2.5 MG PO TABS: ORAL_TABLET | ORAL | 1 refills | Status: DC

## 2022-10-19 NOTE — Telephone Encounter (Signed)
Last seen on 08/24/22 Follow up scheduled on 11/17/22 for botox.

## 2022-10-20 ENCOUNTER — Ambulatory Visit: Payer: BC Managed Care – PPO | Admitting: Skilled Nursing Facility1

## 2022-10-21 ENCOUNTER — Other Ambulatory Visit: Payer: BC Managed Care – PPO

## 2022-10-26 DIAGNOSIS — M961 Postlaminectomy syndrome, not elsewhere classified: Secondary | ICD-10-CM | POA: Diagnosis not present

## 2022-10-26 DIAGNOSIS — M9905 Segmental and somatic dysfunction of pelvic region: Secondary | ICD-10-CM | POA: Diagnosis not present

## 2022-10-26 DIAGNOSIS — M5416 Radiculopathy, lumbar region: Secondary | ICD-10-CM | POA: Diagnosis not present

## 2022-10-26 DIAGNOSIS — M9904 Segmental and somatic dysfunction of sacral region: Secondary | ICD-10-CM | POA: Diagnosis not present

## 2022-10-26 DIAGNOSIS — M9903 Segmental and somatic dysfunction of lumbar region: Secondary | ICD-10-CM | POA: Diagnosis not present

## 2022-10-29 ENCOUNTER — Encounter (INDEPENDENT_AMBULATORY_CARE_PROVIDER_SITE_OTHER): Payer: Self-pay

## 2022-11-04 DIAGNOSIS — M5416 Radiculopathy, lumbar region: Secondary | ICD-10-CM | POA: Diagnosis not present

## 2022-11-09 ENCOUNTER — Telehealth: Payer: Self-pay | Admitting: Family Medicine

## 2022-11-09 NOTE — Telephone Encounter (Signed)
LVM and sent mychart msg informing pt of appt change- NP out.

## 2022-11-17 ENCOUNTER — Ambulatory Visit: Payer: BC Managed Care – PPO | Admitting: Family Medicine

## 2022-11-20 ENCOUNTER — Other Ambulatory Visit: Payer: Self-pay | Admitting: Family Medicine

## 2022-11-20 ENCOUNTER — Ambulatory Visit: Payer: BC Managed Care – PPO | Admitting: Family Medicine

## 2022-11-20 ENCOUNTER — Encounter: Payer: Self-pay | Admitting: Family Medicine

## 2022-11-20 VITALS — BP 112/76 | HR 75 | Temp 98.5°F | Wt 237.0 lb

## 2022-11-20 DIAGNOSIS — E119 Type 2 diabetes mellitus without complications: Secondary | ICD-10-CM

## 2022-11-20 DIAGNOSIS — Z794 Long term (current) use of insulin: Secondary | ICD-10-CM | POA: Diagnosis not present

## 2022-11-20 DIAGNOSIS — E162 Hypoglycemia, unspecified: Secondary | ICD-10-CM

## 2022-11-20 DIAGNOSIS — E11649 Type 2 diabetes mellitus with hypoglycemia without coma: Secondary | ICD-10-CM | POA: Diagnosis not present

## 2022-11-20 LAB — GLUCOSE, POCT (MANUAL RESULT ENTRY): POC Glucose: 75 mg/dL (ref 70–99)

## 2022-11-20 MED ORDER — HYDROCODONE-ACETAMINOPHEN 5-325 MG PO TABS: 1.0000 | ORAL_TABLET | Freq: Four times a day (QID) | ORAL | 0 refills | Status: DC | PRN

## 2022-11-20 MED ORDER — GLUCAGON 1 MG/0.2ML ~~LOC~~ SOLN: SUBCUTANEOUS | 1 refills | Status: DC

## 2022-11-20 NOTE — Progress Notes (Signed)
OFFICE VISIT  11/20/2022  CC:  Chief Complaint  Patient presents with   Hypoglycemia    Patient is a 57 y.o. male who presents for recent low glucoses.  HPI: He continues to have excellent diet. Has been noticing he has had to decrease insulin dosing significantly lately in order to avoid hypoglycemia.  Says glucoses going into low 70s. Bad headache for the last 24 hours due to persistent low glucose. Interestingly, he stopped taking his Humalog about 10 days ago but continues to take the same dose of Semglee.  Most recent dose was yesterday morning.  ROS: No nausea or vomiting.  No shortness of breath.  He has slight dizziness.  Slight tremulousness.  Past Medical History:  Diagnosis Date   Achilles tendon disorder    bilat calcific tendonopathy (shockwave therapy, Dr. Jordan Likes)   Chronic low back pain    estab emergeortho 11/2020->DDD/postlaminectomy syndrome   Depression    GERD (gastroesophageal reflux disease)    History of adenomatous polyp of colon 2019   Recall 2024   Hyperlipidemia    rec'd statin 04/2020   Hypervitaminosis D 05/03/2020   oversupplementation (10K U tab qd for a couple yrs)   IBS (irritable bowel syndrome)    Insomnia    Migraine syndrome    Dr. Lucia Gaskins    Past Surgical History:  Procedure Laterality Date   COLONOSCOPY N/A 03/07/2018   2019 Adenoma x 1---recall 2024. Procedure: COLONOSCOPY;  Surgeon: West Bali, MD;  Location: [facility];  Service: Endoscopy;  Laterality: N/A;  10:30   coronary calcium score     09/2022 ZERO   ESOPHAGOGASTRODUODENOSCOPY  2005   LUMBAR EPIDURAL INJECTION  2014   L4-5   LUMBAR LAMINECTOMY  2014   MOUTH SURGERY  2003   POLYPECTOMY  03/07/2018   Procedure: POLYPECTOMY;  Surgeon: West Bali, MD;  Location: [facility];  Service: Endoscopy;;  sigmoid    Outpatient Medications Prior to Visit  Medication Sig Dispense Refill   BD PEN NEEDLE NANO 2ND GEN 32G X 4 MM MISC USE TO ADMINISTER LANTUS ONCE DAILY  AND HUMALOG THREE TIMES DAILY. 400 each 1   BOTOX 100 units SOLR injection PROVIDER TO INJECT 155 UNITS INTO THE MUSCLES OF THE HEAD AND NECK EVERY 3 MONTHS. DISCARD REMAINDER. 200 Units 1   co-enzyme Q-10 50 MG capsule Take 200 mg by mouth daily.     Continuous Glucose Receiver (FREESTYLE LIBRE 2 READER) DEVI 1 each by Does not apply route every 14 (fourteen) days. 2 each 2   Continuous Glucose Sensor (FREESTYLE LIBRE 2 SENSOR) MISC 1 EACH BY DOES NOT APPLY ROUTE EVERY 14 (FOURTEEN) DAYS. 6 each 1   Cyanocobalamin (VITAMIN B-12 PO) Take by mouth.     cyclobenzaprine (FLEXERIL) 10 MG tablet Take 1 tablet (10 mg total) by mouth 3 (three) times daily as needed for muscle spasms. 90 tablet 1   glucose blood (FREESTYLE PRECISION NEO TEST) test strip Use to check sugar 1-2 times daily. 100 each 12   insulin glargine-yfgn (SEMGLEE) 100 UNIT/ML Pen 18 U SQ qhs and gradually increase dose as per provider instructions 15 mL 1   insulin lispro (HUMALOG KWIKPEN) 100 UNIT/ML KwikPen 10 U qAC tid 15 mL 1   loratadine (CLARITIN) 10 MG tablet Take 10 mg by mouth daily.     Magnesium 500 MG TABS Take 500 mg by mouth daily.     Multiple Vitamins-Minerals (ZINC PO) Take 12 mg by mouth.  naratriptan (AMERGE) 2.5 MG tablet Take one (1) tablet at onset of headache; if returns or does not resolve, may repeat after 4 hours; do not exceed five (5) mg in 24 hours. 9 tablet 1   prochlorperazine (COMPAZINE) 10 MG tablet Take 1 tablet (10 mg total) by mouth every 6 (six) hours as needed for nausea or vomiting. 30 tablet 0   traZODone (DESYREL) 50 MG tablet TAKE 1-4 TABLETS BY MOUTH AT BEDTIME AS NEEDED FOR INSOMNIA. 360 tablet 1   VITAMIN D PO Take 10,000 Int'l Units by mouth.     VITAMIN K PO Take 200 mg by mouth.     No facility-administered medications prior to visit.    Allergies  Allergen Reactions   Amoxicillin    Penicillins    Pseudoephedrine Palpitations   Sulfa Antibiotics Rash   Wellbutrin [Bupropion]  Nausea Only, Anxiety and Other (See Comments)    Insomnia and paranoia    Review of Systems  As per HPI  PE:    11/20/2022    2:47 PM 10/01/2022    2:53 PM 09/04/2022    8:29 AM  Vitals with BMI  Height  5\' 10"    Weight 237 lbs 245 lbs 250 lbs 6 oz  BMI  35.15   Systolic 112 115 161  Diastolic 76 81 80  Pulse 75 78 82     Physical Exam  Gen: Alert, well appearing.  Patient is oriented to person, place, time, and situation. AFFECT: pleasant, lucid thought and speech.   LABS:  Last metabolic panel Lab Results  Component Value Date   GLUCOSE 322 (H) 08/25/2022   NA 136 08/25/2022   K 4.3 08/25/2022   CL 97 08/25/2022   CO2 29 08/25/2022   BUN 14 08/25/2022   CREATININE 1.03 08/25/2022   GFR 81.29 08/25/2022   CALCIUM 9.6 08/25/2022   PROT 7.9 08/25/2022   ALBUMIN 4.6 08/25/2022   BILITOT 0.6 08/25/2022   ALKPHOS 123 (H) 08/25/2022   AST 16 08/25/2022   ALT 26 08/25/2022   Last hemoglobin A1c Lab Results  Component Value Date   HGBA1C 11.3 (A) 08/25/2022   HGBA1C 11.3 08/25/2022   HGBA1C 11.3 (A) 08/25/2022   HGBA1C 11.3 (A) 08/25/2022   IMPRESSION AND PLAN:  #1 hypoglycemia in the setting of diabetes treated with insulin. Glucose on his meter while in the office today was 77.  Our meter showed 75. Glucose went up to 101 after having a small apple juice here. I recommended he stop his Semglee and remain off Humalog for now.  Increase carb intake. Do not add back any insulin until glucose is greater than 150 for 2 days.  Then add back Semglee slowly.  Next step would be to add back Humalog slowly. Prescription for glucagon sent in today. Additionally I prescribed a few Vicodin 5/325 to take as needed headache until this problem resolves #15.  An After Visit Summary was printed and given to the patient.  FOLLOW UP: No follow-ups on file.  Signed:  Santiago Bumpers, MD           11/20/2022

## 2022-11-20 NOTE — Progress Notes (Unsigned)
11/23/2022 ALL: Carlos Ford returns for Botox. He is doing well form migraine standpoint. He was diagnosed with DMT2. A1C was 13. CBGs have improved. He is fasting.   08/24/2022 ALL: Carlos Ford returns for Botox. He continues to do well. He estimates about 1 migraine a month or less. Amerge works well.   05/14/2022 ALL: He returns for Botox. He continues to do well. Has had two migraines over past 12 weeks. Amerge works well.   02/18/2022 ALL: Carlos Ford continues to do well. May have 1 migraine every 12 weeks.   11/24/2021 ALL: he returns for Botox. He continues to do well. He has taken Amerge twice over the past 12 weeks. It continues to work well for abortive therapy.   09/01/2021 ALL: He returns for Botox. He continues to do well. Amerge continues to work well for abortive therapy.   06/05/2021 ALL: He returns for Botox. He continues to do well. Migraines are rare until the last 2-3 weeks prior to Botox. He continues Amerge as needed.   03/05/2021 ALL: Carlos Ford returns for follow up. He continues to do well. Amerge works well for abortive therapy. He rarely has migraines until 2 weeks prior to next Botox procedure.   11/27/2020 ALL: He returns for Botox. He is doing well. No changes in migraines. He continues Amerge for abortive therapy.   08/26/2020 ALL: He continues to do well on Botox. He does not usually have any migraines until about 1-2 weeks prior to next procedure. Amerge works very well for abortive therapy.   05/16/2020 ALL: He is doing well. Migraines are well managed on Botox. He did not have typical end of cycle worsening of headaches this time. He continues Amerge for abortive therapy.   02/07/2020 ALL: He is doing well. He is now working nights. He does have more headaches toward end of Botox cycle. Amerge helps.   Consent Form Botulism Toxin Injection For Chronic Migraine    Reviewed orally with patient, additionally signature is on file:  Botulism toxin has been approved by the Federal drug  administration for treatment of chronic migraine. Botulism toxin does not cure chronic migraine and it may not be effective in some patients.  The administration of botulism toxin is accomplished by injecting a small amount of toxin into the muscles of the neck and head. Dosage must be titrated for each individual. Any benefits resulting from botulism toxin tend to wear off after 3 months with a repeat injection required if benefit is to be maintained. Injections are usually done every 3-4 months with maximum effect peak achieved by about 2 or 3 weeks. Botulism toxin is expensive and you should be sure of what costs you will incur resulting from the injection.  The side effects of botulism toxin use for chronic migraine may include:   -Transient, and usually mild, facial weakness with facial injections  -Transient, and usually mild, head or neck weakness with head/neck injections  -Reduction or loss of forehead facial animation due to forehead muscle weakness  -Eyelid drooping  -Dry eye  -Pain at the site of injection or bruising at the site of injection  -Double vision  -Potential unknown long term risks   Contraindications: You should not have Botox if you are pregnant, nursing, allergic to albumin, have an infection, skin condition, or muscle weakness at the site of the injection, or have myasthenia gravis, Lambert-Eaton syndrome, or ALS.  It is also possible that as with any injection, there may be an allergic reaction or no effect  from the medication. Reduced effectiveness after repeated injections is sometimes seen and rarely infection at the injection site may occur. All care will be taken to prevent these side effects. If therapy is given over a long time, atrophy and wasting in the muscle injected may occur. Occasionally the patient's become refractory to treatment because they develop antibodies to the toxin. In this event, therapy needs to be modified.  I have read the above  information and consent to the administration of botulism toxin.    BOTOX PROCEDURE NOTE FOR MIGRAINE HEADACHE  Contraindications and precautions discussed with patient(above). Aseptic procedure was observed and patient tolerated procedure. Procedure performed by Shawnie Dapper, FNP-C.   The condition has existed for more than 6 months, and pt does not have a diagnosis of ALS, Myasthenia Gravis or Lambert-Eaton Syndrome.  Risks and benefits of injections discussed and pt agrees to proceed with the procedure.  Written consent obtained  These injections are medically necessary. Pt  receives good benefits from these injections. These injections do not cause sedations or hallucinations which the oral therapies may cause.   Description of procedure:  The patient was placed in a sitting position. The standard protocol was used for Botox as follows, with 5 units of Botox injected at each site:  -Procerus muscle, midline injection  -Corrugator muscle, bilateral injection  -Frontalis muscle, bilateral injection, with 2 sites each side, medial injection was performed in the upper one third of the frontalis muscle, in the region vertical from the medial inferior edge of the superior orbital rim. The lateral injection was again in the upper one third of the forehead vertically above the lateral limbus of the cornea, 1.5 cm lateral to the medial injection site.  -Temporalis muscle injection, 4 sites, bilaterally. The first injection was 3 cm above the tragus of the ear, second injection site was 1.5 cm to 3 cm up from the first injection site in line with the tragus of the ear. The third injection site was 1.5-3 cm forward between the first 2 injection sites. The fourth injection site was 1.5 cm posterior to the second injection site. 5th site laterally in the temporalis  muscleat the level of the outer canthus.  -Occipitalis muscle injection, 3 sites, bilaterally. The first injection was done one half way  between the occipital protuberance and the tip of the mastoid process behind the ear. The second injection site was done lateral and superior to the first, 1 fingerbreadth from the first injection. The third injection site was 1 fingerbreadth superiorly and medially from the first injection site.  -Cervical paraspinal muscle injection, 2 sites, bilaterally. The first injection site was 1 cm from the midline of the cervical spine, 3 cm inferior to the lower border of the occipital protuberance. The second injection site was 1.5 cm superiorly and laterally to the first injection site.  -Trapezius muscle injection was performed at 3 sites, bilaterally. The first injection site was in the upper trapezius muscle halfway between the inflection point of the neck, and the acromion. The second injection site was one half way between the acromion and the first injection site. The third injection was done between the first injection site and the inflection point of the neck.   Will return for repeat injection in 3 months.   A total of 200 units of Botox was prepared, 155 units of Botox was injected as documented above, any Botox not injected was wasted. The patient tolerated the procedure well, there were no complications of  the above procedure.

## 2022-11-23 ENCOUNTER — Ambulatory Visit: Payer: BC Managed Care – PPO | Admitting: Family Medicine

## 2022-11-23 ENCOUNTER — Encounter: Payer: Self-pay | Admitting: Family Medicine

## 2022-11-23 DIAGNOSIS — G43709 Chronic migraine without aura, not intractable, without status migrainosus: Secondary | ICD-10-CM | POA: Diagnosis not present

## 2022-11-23 MED ORDER — ONABOTULINUMTOXINA 200 UNITS IJ SOLR
155.0000 [IU] | Freq: Once | INTRAMUSCULAR | Status: AC
Start: 2022-11-23 — End: 2022-11-23
  Administered 2022-11-23: 155 [IU] via INTRAMUSCULAR

## 2022-11-23 NOTE — Progress Notes (Signed)
Botox- 200 units x 1 vial Lot: [id number] Expiration: 12/20256 NDC: 0023-3921-02  Bacteriostatic 0.9% Sodium Chloride- 4mL  Lot: [id number] Expiration: 06/15/2023 NDC: 0454-0981-19  Dx: 43.709 B/B Witnessed by Garner Gavel, CMA

## 2022-11-24 ENCOUNTER — Telehealth: Payer: Self-pay

## 2022-11-24 NOTE — Telephone Encounter (Signed)
Pharmacy fax received stating hydrocodone is on back order and not available.

## 2022-11-30 DIAGNOSIS — M5416 Radiculopathy, lumbar region: Secondary | ICD-10-CM | POA: Diagnosis not present

## 2022-12-07 ENCOUNTER — Ambulatory Visit: Payer: BC Managed Care – PPO | Admitting: Family Medicine

## 2022-12-07 VITALS — BP 120/78 | HR 61 | Wt 230.4 lb

## 2022-12-07 DIAGNOSIS — E119 Type 2 diabetes mellitus without complications: Secondary | ICD-10-CM

## 2022-12-07 DIAGNOSIS — E7800 Pure hypercholesterolemia, unspecified: Secondary | ICD-10-CM

## 2022-12-07 DIAGNOSIS — Z794 Long term (current) use of insulin: Secondary | ICD-10-CM

## 2022-12-07 DIAGNOSIS — E162 Hypoglycemia, unspecified: Secondary | ICD-10-CM

## 2022-12-07 LAB — BASIC METABOLIC PANEL
BUN: 16 mg/dL (ref 6–23)
CO2: 26 mEq/L (ref 19–32)
Calcium: 9.8 mg/dL (ref 8.4–10.5)
Chloride: 102 mEq/L (ref 96–112)
Creatinine, Ser: 1.1 mg/dL (ref 0.40–1.50)
GFR: 74.97 mL/min (ref >60.00)
Glucose, Bld: 74 mg/dL (ref 70–99)
Potassium: 4.4 mEq/L (ref 3.5–5.1)
Sodium: 140 mEq/L (ref 135–145)

## 2022-12-07 LAB — LIPID PANEL
Cholesterol: 261 mg/dL — ABNORMAL HIGH (ref 0–200)
HDL: 48.7 mg/dL (ref >39.00)
LDL Cholesterol: 178 mg/dL — ABNORMAL HIGH (ref 0–99)
NonHDL: 212.49
Total CHOL/HDL Ratio: 5
Triglycerides: 172 mg/dL — ABNORMAL HIGH (ref 0.0–149.0)
VLDL: 34.4 mg/dL (ref 0.0–40.0)

## 2022-12-07 LAB — POCT GLYCOSYLATED HEMOGLOBIN (HGB A1C)
HbA1c POC (<> result, manual entry): 5.4 % (ref 4.0–5.6)
HbA1c, POC (controlled diabetic range): 5.4 % (ref 0.0–7.0)
HbA1c, POC (prediabetic range): 5.4 % — AB (ref 5.7–6.4)
Hemoglobin A1C: 5.4 % (ref 4.0–5.6)

## 2022-12-07 NOTE — Progress Notes (Signed)
OFFICE VISIT  12/07/2022  CC:  Chief Complaint  Patient presents with   Medical Management of Chronic Issues    Patient is a 57 y.o. male who presents for follow-up diabetes. A/P as of last visit 11/20/22: "#1 hypoglycemia in the setting of diabetes treated with insulin. Glucose on his meter while in the office today was 77.  Our meter showed 75. Glucose went up to 101 after having a small apple juice here. I recommended he stop his Semglee and remain off Humalog for now.  Increase carb intake. Do not add back any insulin until glucose is greater than 150 for 2 days.  Then add back Semglee slowly.  Next step would be to add back Humalog slowly. Prescription for glucagon sent in today. Additionally I prescribed a few Vicodin 5/325 to take as needed headache until this problem resolves #15."  INTERIM HX: Carlos Ford is feeling fine. He has not taken any insulin at all in the last few weeks. However, he continues to have glucoses essentially in the 80-100 range for the most part.  He says he never goes over 140.  His glucometer alarm does go off in the middle the night many nights stating that his sugar is 60.  However he states he does not feel hypoglycemic at those times. H has gradually increased the carbohydrates in his diet since his low to low normal glucoses OFF INSULIN have been an issue.  PMP AWARE reviewed today: he did not fill the vicodin rx I provided last visit. No red flags.   Past Medical History:  Diagnosis Date   Achilles tendon disorder    bilat calcific tendonopathy (shockwave therapy, Dr. Jordan Likes)   Chronic low back pain    estab emergeortho 11/2020->DDD/postlaminectomy syndrome   Depression    Diabetes (HCC)    GERD (gastroesophageal reflux disease)    History of adenomatous polyp of colon 2019   Recall 2024   Hyperlipidemia    rec'd statin 04/2020   Hypervitaminosis D 05/03/2020   oversupplementation (10K U tab qd for a couple yrs)   IBS (irritable bowel syndrome)     Insomnia    Migraine syndrome    Dr. Lucia Gaskins    Past Surgical History:  Procedure Laterality Date   COLONOSCOPY N/A 03/07/2018   2019 Adenoma x 1---recall 2024. Procedure: COLONOSCOPY;  Surgeon: West Bali, MD;  Location: [facility];  Service: Endoscopy;  Laterality: N/A;  10:30   coronary calcium score     09/2022 ZERO   ESOPHAGOGASTRODUODENOSCOPY  2005   LUMBAR EPIDURAL INJECTION  2014   L4-5   LUMBAR LAMINECTOMY  2014   MOUTH SURGERY  2003   POLYPECTOMY  03/07/2018   Procedure: POLYPECTOMY;  Surgeon: West Bali, MD;  Location: [facility];  Service: Endoscopy;;  sigmoid    Outpatient Medications Prior to Visit  Medication Sig Dispense Refill   BD PEN NEEDLE NANO 2ND GEN 32G X 4 MM MISC USE TO ADMINISTER LANTUS ONCE DAILY AND HUMALOG THREE TIMES DAILY. 400 each 1   BOTOX 100 units SOLR injection PROVIDER TO INJECT 155 UNITS INTO THE MUSCLES OF THE HEAD AND NECK EVERY 3 MONTHS. DISCARD REMAINDER. 200 Units 1   co-enzyme Q-10 50 MG capsule Take 200 mg by mouth daily.     Continuous Glucose Receiver (FREESTYLE LIBRE 2 READER) DEVI 1 each by Does not apply route every 14 (fourteen) days. 2 each 2   Continuous Glucose Sensor (FREESTYLE LIBRE 2 SENSOR) MISC 1 EACH  BY DOES NOT APPLY ROUTE EVERY 14 (FOURTEEN) DAYS. 6 each 1   Cyanocobalamin (VITAMIN B-12 PO) Take by mouth.     cyclobenzaprine (FLEXERIL) 10 MG tablet Take 1 tablet (10 mg total) by mouth 3 (three) times daily as needed for muscle spasms. 90 tablet 1   Glucagon 1 MG/0.2ML SOLN 1 ml SQ prn hypoglycemic reaction 0.2 mL 1   glucose blood (FREESTYLE PRECISION NEO TEST) test strip Use to check sugar 1-2 times daily. 100 each 12   HYDROcodone-acetaminophen (NORCO/VICODIN) 5-325 MG tablet Take 1-2 tablets by mouth every 6 (six) hours as needed for moderate pain. 15 tablet 0   insulin glargine-yfgn (SEMGLEE) 100 UNIT/ML Pen 18 U SQ qhs and gradually increase dose as per provider instructions 15 mL 1   insulin lispro  (HUMALOG KWIKPEN) 100 UNIT/ML KwikPen 10 U qAC tid 15 mL 1   loratadine (CLARITIN) 10 MG tablet Take 10 mg by mouth daily.     Magnesium 500 MG TABS Take 500 mg by mouth daily.     Multiple Vitamins-Minerals (ZINC PO) Take 12 mg by mouth.     naratriptan (AMERGE) 2.5 MG tablet Take one (1) tablet at onset of headache; if returns or does not resolve, may repeat after 4 hours; do not exceed five (5) mg in 24 hours. 9 tablet 1   prochlorperazine (COMPAZINE) 10 MG tablet Take 1 tablet (10 mg total) by mouth every 6 (six) hours as needed for nausea or vomiting. 30 tablet 0   traZODone (DESYREL) 50 MG tablet TAKE 1-4 TABLETS BY MOUTH AT BEDTIME AS NEEDED FOR INSOMNIA. 360 tablet 1   VITAMIN D PO Take 10,000 Int'l Units by mouth.     VITAMIN K PO Take 200 mg by mouth.     No facility-administered medications prior to visit.    Allergies  Allergen Reactions   Amoxicillin    Penicillins    Pseudoephedrine Palpitations   Sulfa Antibiotics Rash   Wellbutrin [Bupropion] Nausea Only, Anxiety and Other (See Comments)    Insomnia and paranoia    Review of Systems As per HPI  PE:    12/07/2022    9:26 AM 11/20/2022    2:47 PM 10/01/2022    2:53 PM  Vitals with BMI  Height   5\' 10"   Weight 230 lbs 6 oz 237 lbs 245 lbs  BMI   35.15  Systolic 120 112 161  Diastolic 78 76 81  Pulse 61 75 78     Physical Exam  Gen: Alert, well appearing.  Patient is oriented to person, place, time, and situation. AFFECT: pleasant, lucid thought and speech. CV: RRR, no m/r/g.   LUNGS: CTA bilat, nonlabored resps, good aeration in all lung fields.   LABS:  Last CBC Lab Results  Component Value Date   WBC 6.8 09/04/2022   HGB 15.0 09/04/2022   HCT 44.7 09/04/2022   MCV 84.9 09/04/2022   RDW 14.0 09/04/2022   PLT 303.0 09/04/2022   Last metabolic panel Lab Results  Component Value Date   GLUCOSE 322 (H) 08/25/2022   NA 136 08/25/2022   K 4.3 08/25/2022   CL 97 08/25/2022   CO2 29 08/25/2022    BUN 14 08/25/2022   CREATININE 1.03 08/25/2022   GFR 81.29 08/25/2022   CALCIUM 9.6 08/25/2022   PROT 7.9 08/25/2022   ALBUMIN 4.6 08/25/2022   BILITOT 0.6 08/25/2022   ALKPHOS 123 (H) 08/25/2022   AST 16 08/25/2022   ALT 26 08/25/2022  Last lipids Lab Results  Component Value Date   CHOL 217 (H) 09/04/2022   HDL 40.60 09/04/2022   LDLDIRECT 158.0 09/04/2022   TRIG 222.0 (H) 09/04/2022   CHOLHDL 5 09/04/2022   Last hemoglobin A1c Lab Results  Component Value Date   HGBA1C 11.3 (A) 08/25/2022   HGBA1C 11.3 08/25/2022   HGBA1C 11.3 (A) 08/25/2022   HGBA1C 11.3 (A) 08/25/2022   Last thyroid functions Lab Results  Component Value Date   TSH 3.88 09/04/2022   IMPRESSION AND PLAN:  #1 newly diagnosed diabetes (initial dx 08/2022, at which time his Hba1c was 11.3% and he had classic symptoms).   He did a very aggressive low-carb diet and we started insulin. Glucoses quickly normalized and in fact he started having hypoglycemia.  Even after stopping insulin his sugars have remained in the low normal range with relatively frequent nocturnal hypoglycemia. Point-of-care hemoglobin A1c today is 5.4%. Will ask endocrinology to see him.  #2 hypercholesterolemia. Have recommended statin in the past. Lipid panel today.  An After Visit Summary was printed and given to the patient.  FOLLOW UP: Return in about 3 months (around 03/08/2023) for routine chronic illness f/u. Next CPE July 2025 Signed:  Santiago Bumpers, MD           12/07/2022

## 2022-12-23 DIAGNOSIS — M5416 Radiculopathy, lumbar region: Secondary | ICD-10-CM | POA: Diagnosis not present

## 2022-12-27 IMAGING — CR DG CERVICAL SPINE COMPLETE 4+V
6 series · 6 of 6 positions shown · non-contrast
Comparison: None

CLINICAL DATA: Cervicalgia, RIGHT-side neck and shoulder pain

EXAM:
CERVICAL SPINE - COMPLETE 4+ VIEW

[w cervical spine lat]
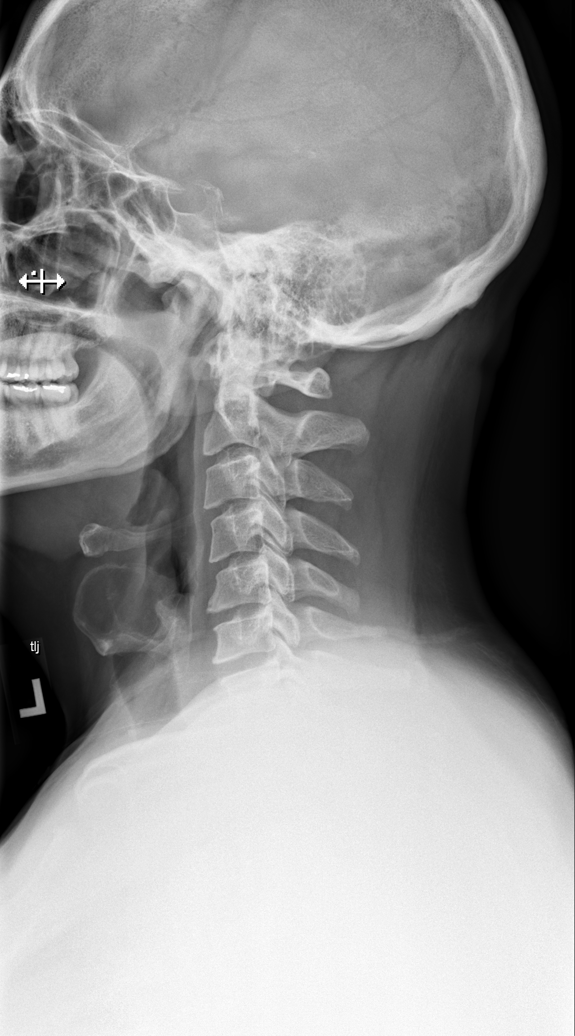

[w cervical spine ap_obl (1 of 2)]
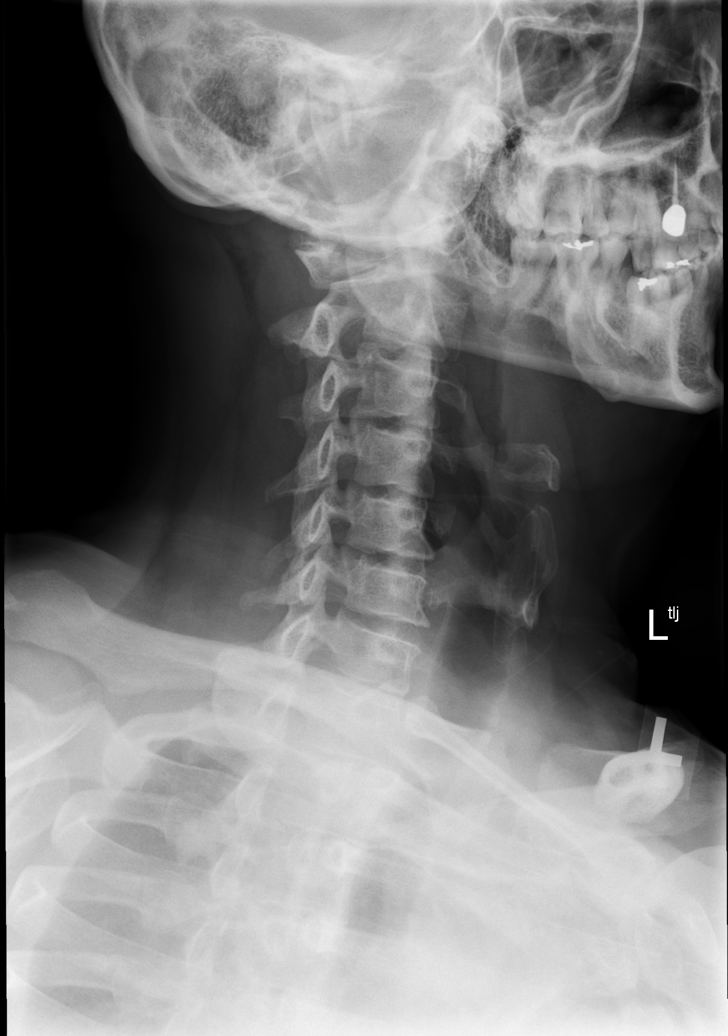

[w cervical spine ap_obl (2 of 2)]
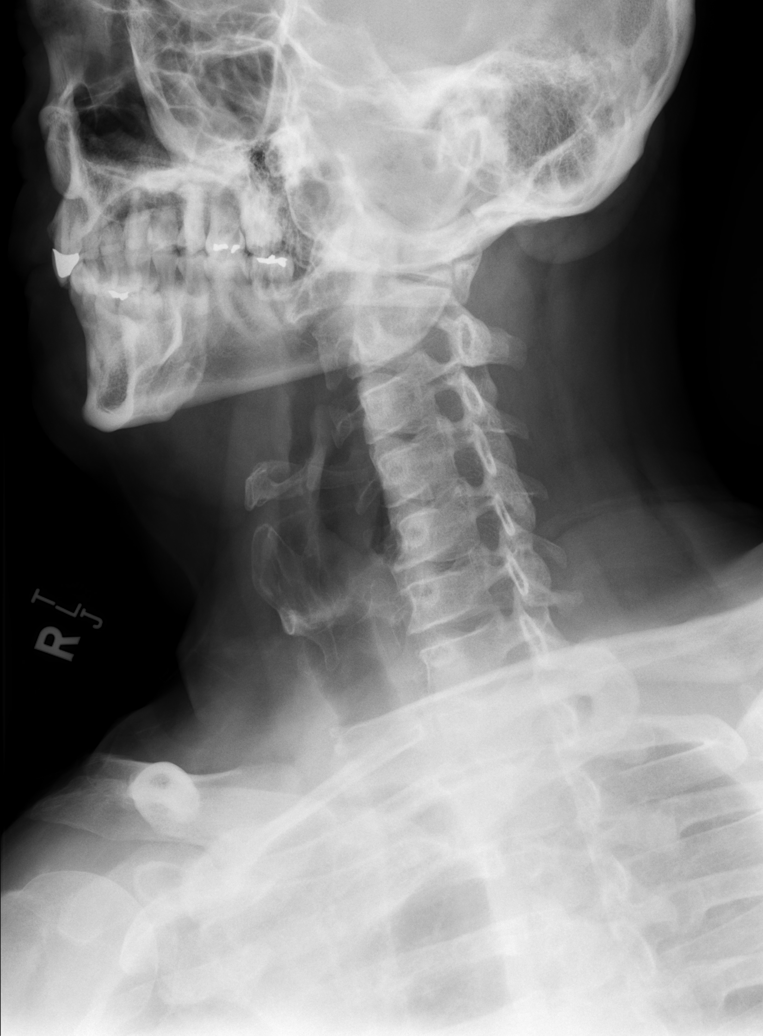

[w cervical spine ap]
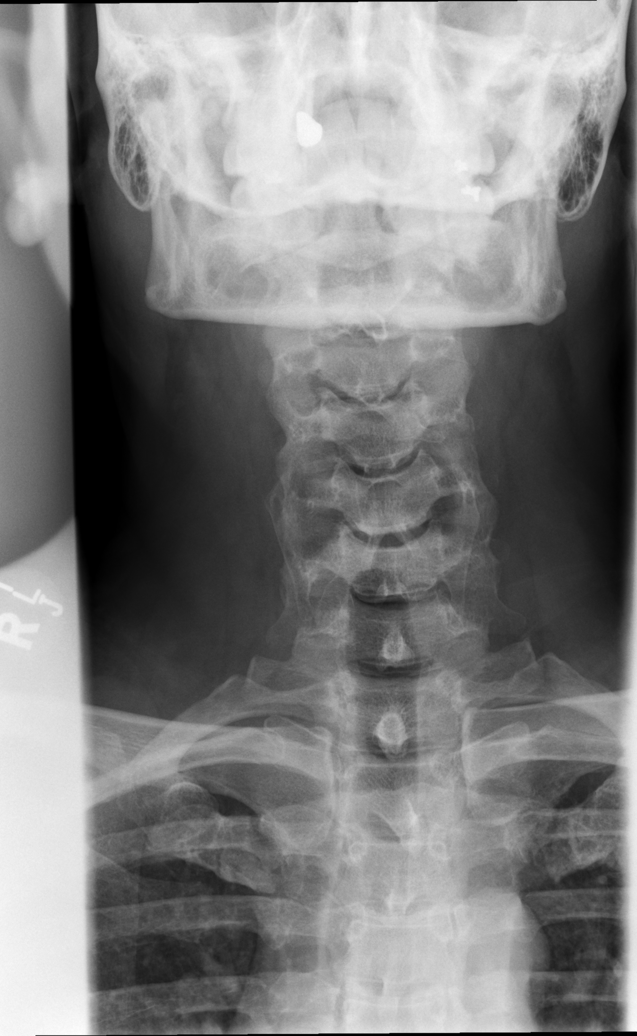

[w cervical spine odontoid (1 of 2)]
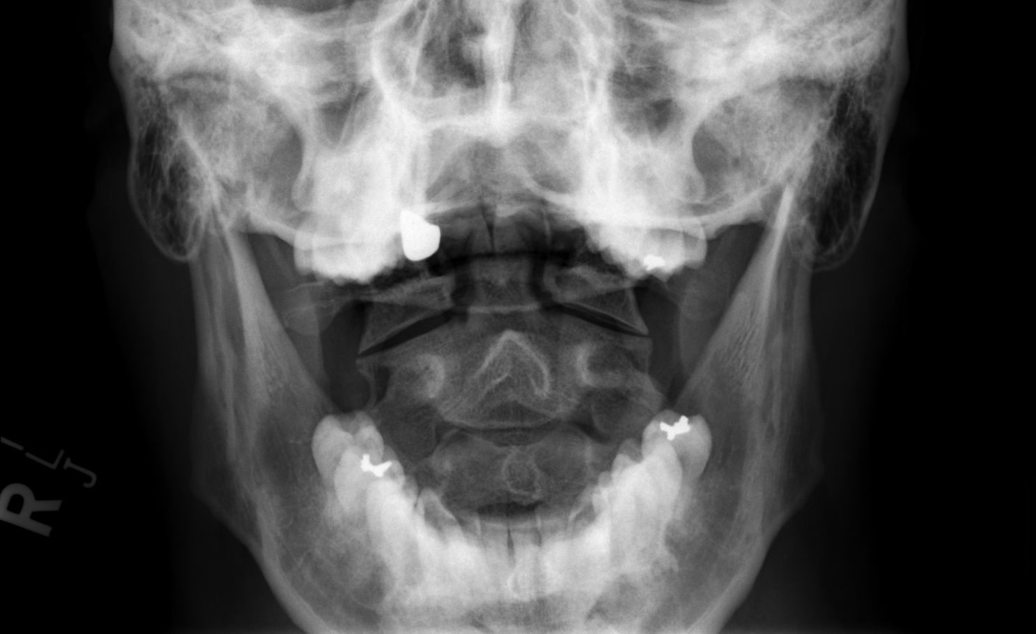

[w cervical spine odontoid (2 of 2)]
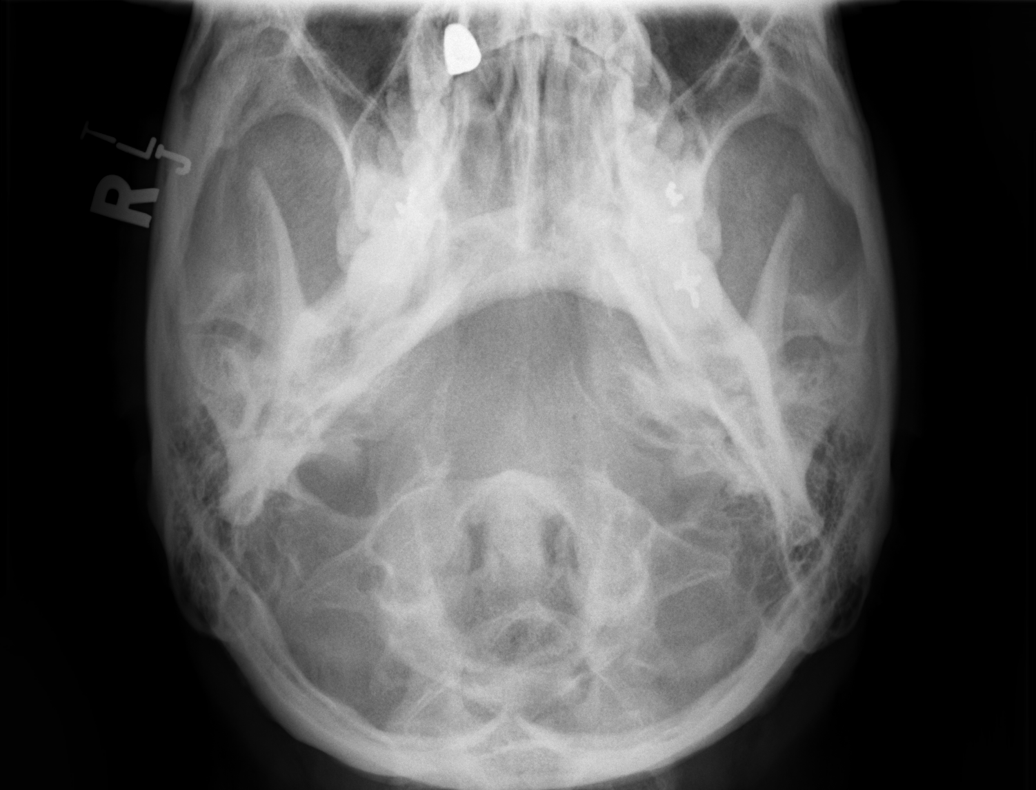

[6 of 6 positions shown; findings below may reference images not displayed]

FINDINGS: Straightening of cervical lordosis question muscle spasm.

Prevertebral soft tissues normal thickness.

Vertebral body and disc space heights maintained.

No fracture, subluxation, or bone destruction.

Tiny anterior spurs at C5-C6 and C6-C7.

Bony foramina patent.

Lung apices clear.
IMPRESSION: Minor degenerative disc disease changes at C5-C6 and C6-C7.

Question muscle spasm.

## 2022-12-27 IMAGING — CR DG LUMBAR SPINE COMPLETE 4+V
5 series · 5 of 5 positions shown · non-contrast
Comparison: MRI lumbar spine 11/25/2017

CLINICAL DATA: Low back pain with pain and numbness radiating down
LEFT leg

EXAM:
LUMBAR SPINE - COMPLETE 4+ VIEW

[w lumbar spine ap]
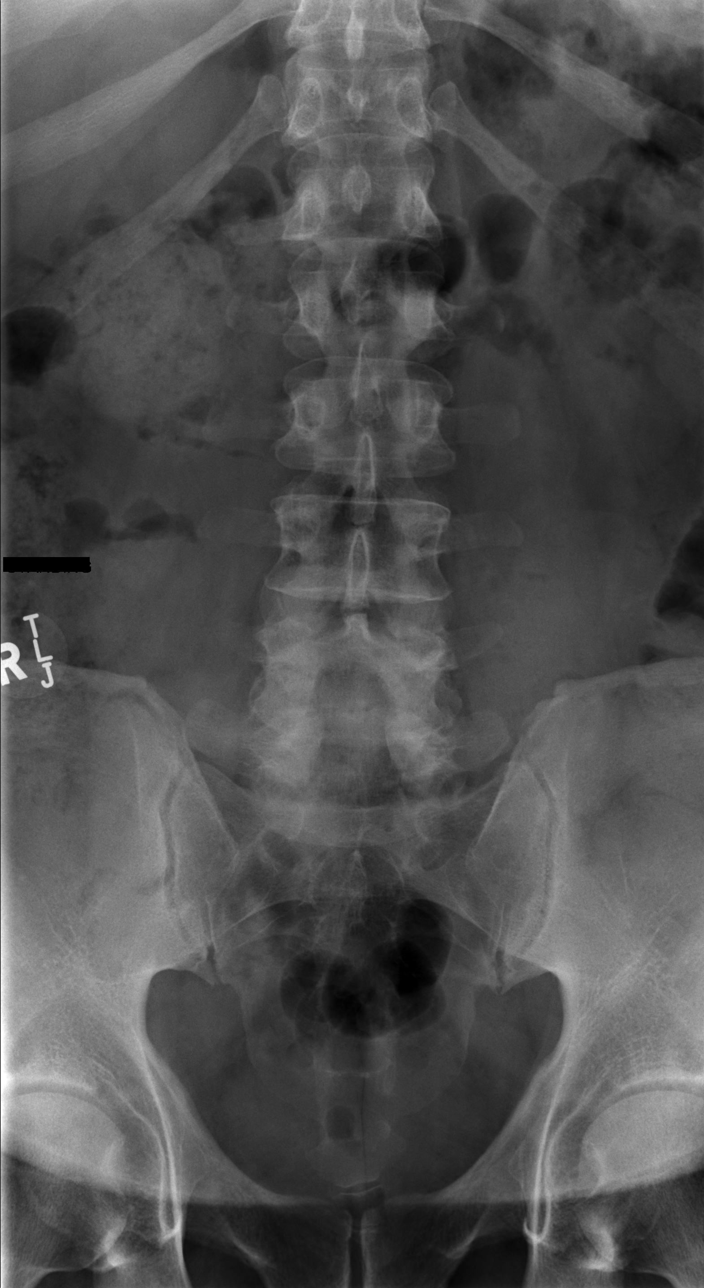

[w lumbar spine obl (1 of 2)]
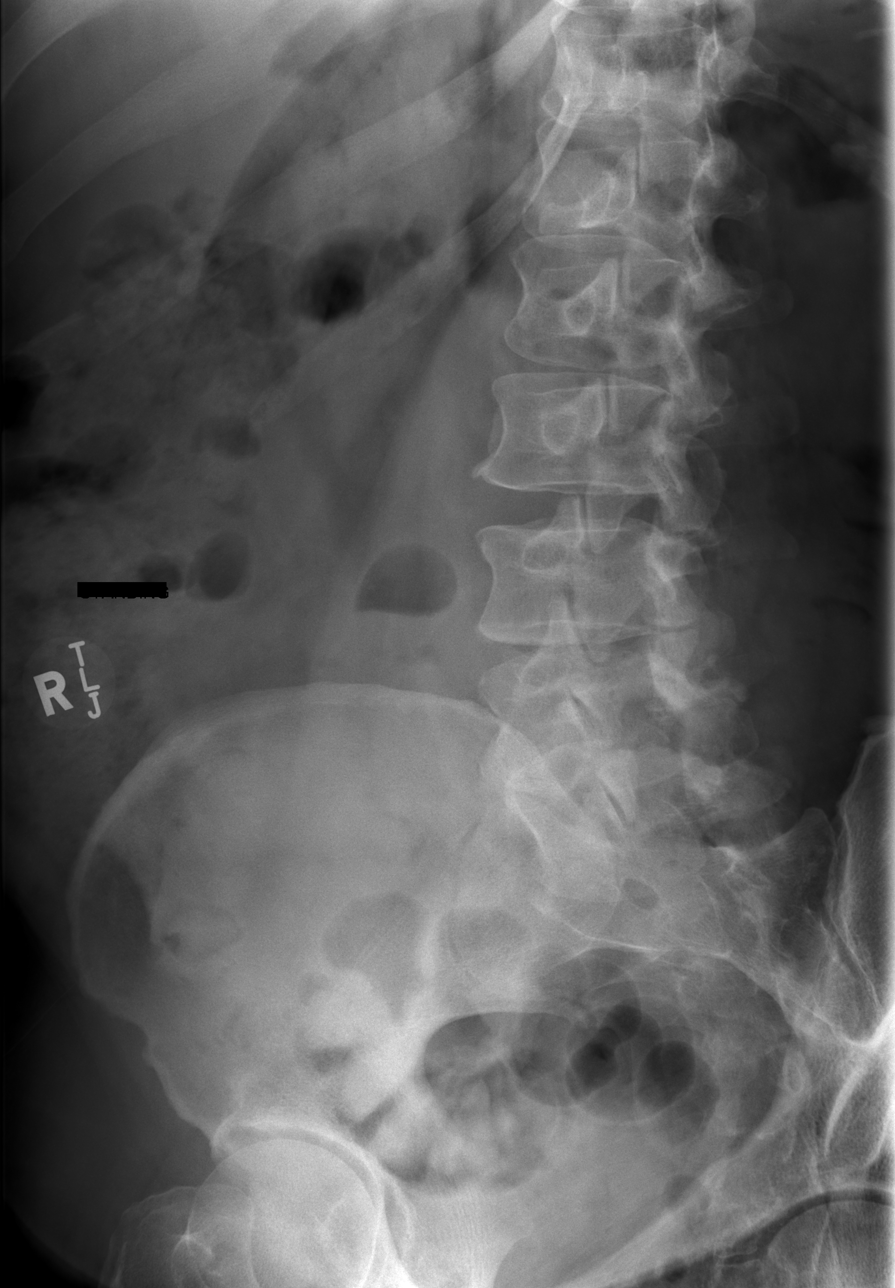

[w lumbar spine obl (2 of 2)]
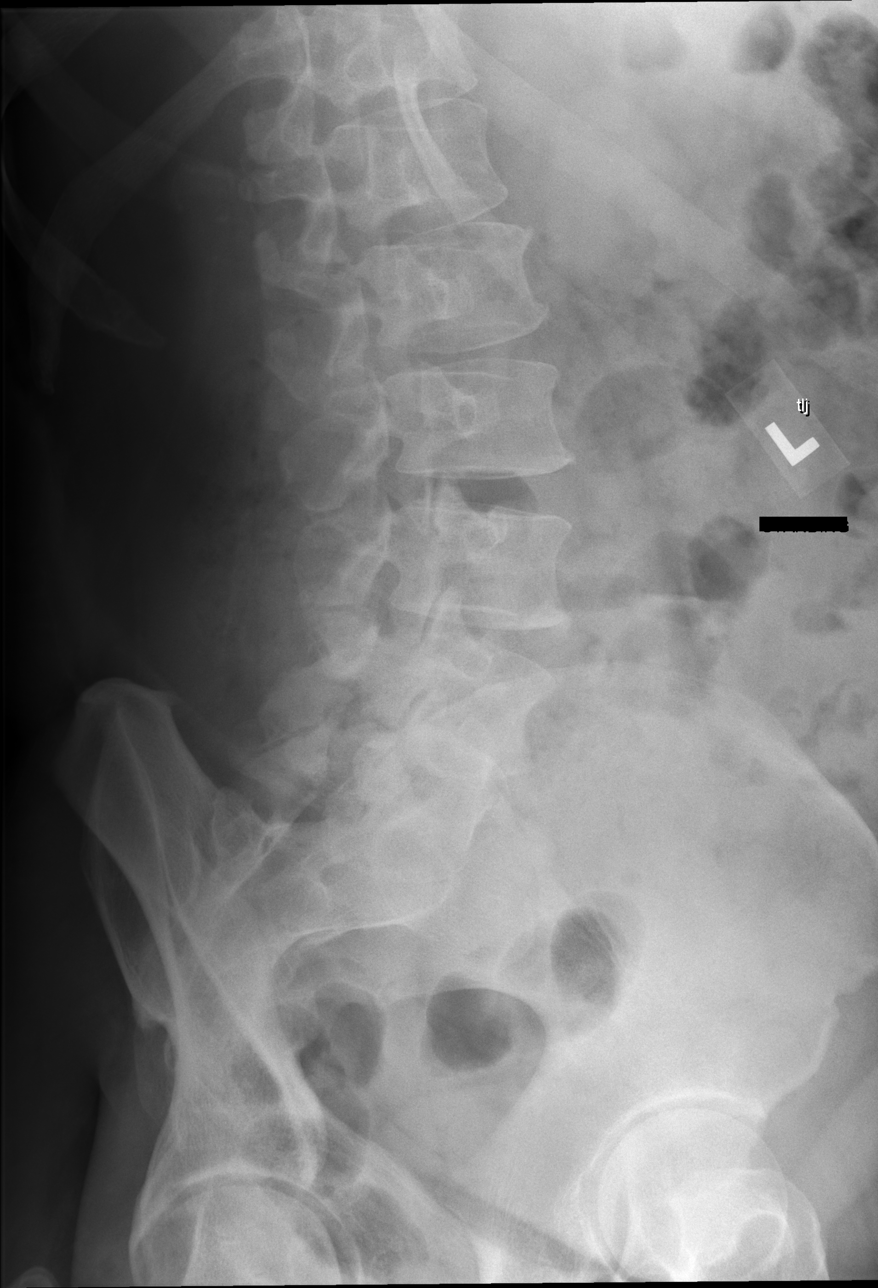

[w lumbar spine lat]
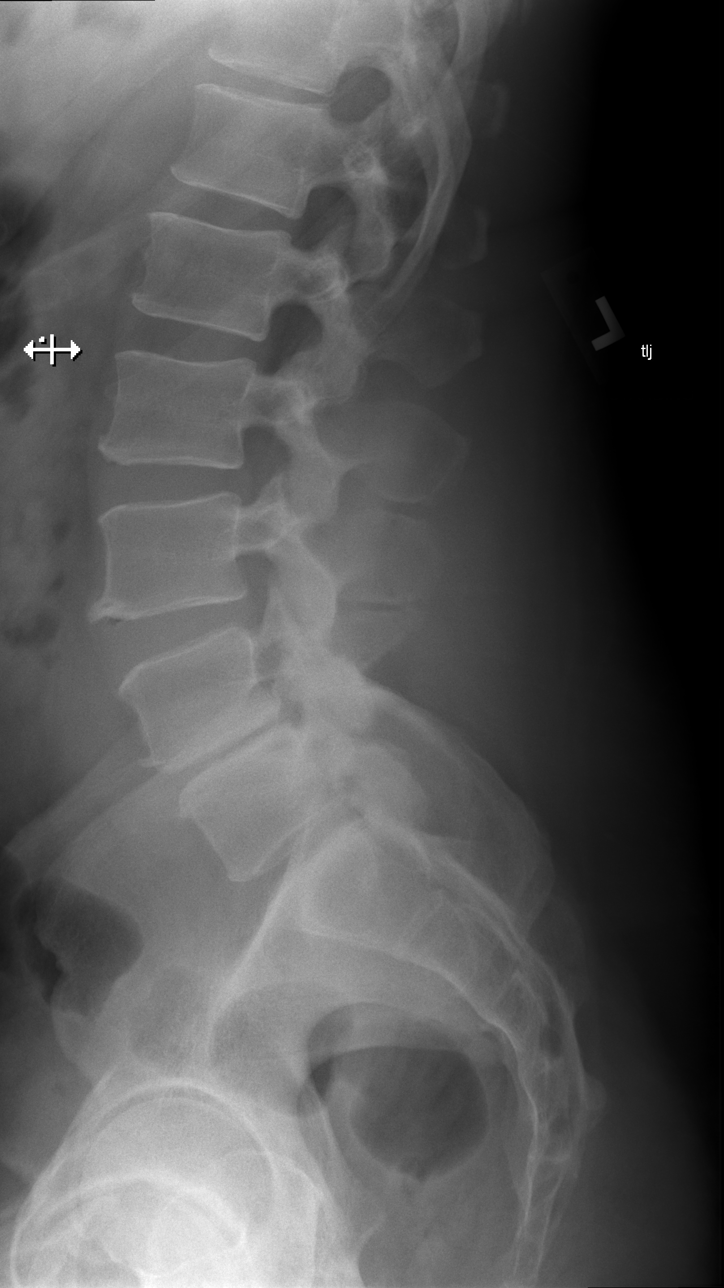

[w lumbar l-5 s-1 spot]
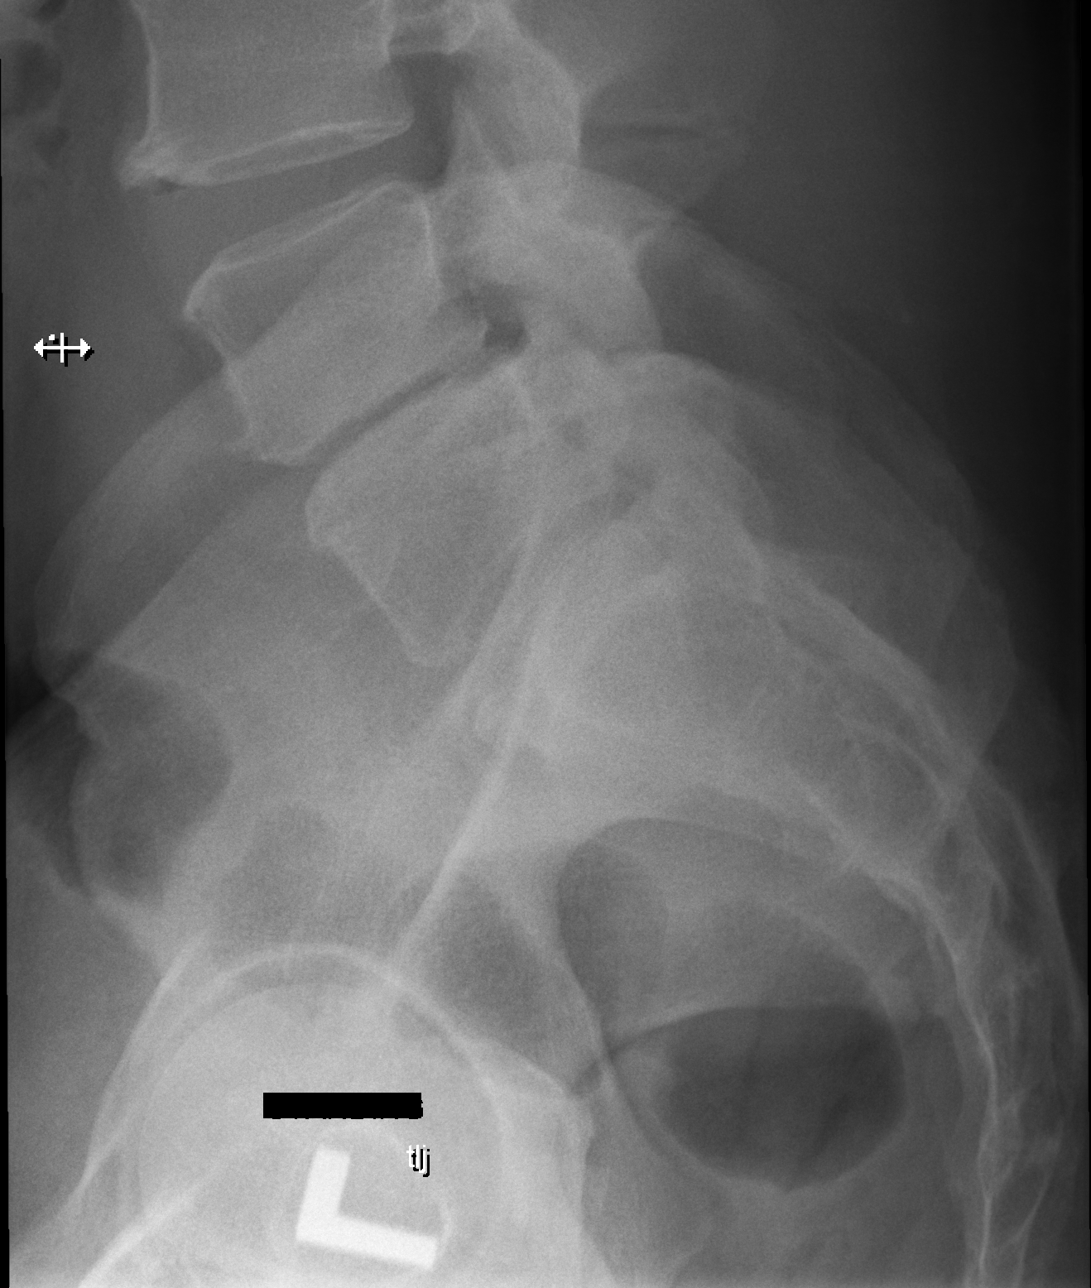

[5 of 5 positions shown; findings below may reference images not displayed]

FINDINGS: Six non-rib-bearing lumbar type vertebra, on prior MR labeled with
last intervertebral vertebra labeled L5, similar numbering utilized
on this exam.

Prior laminectomy L5.

Disc space narrowing and tiny endplate spurs at L4-L5.

Vertebral body and remaining disc space heights maintained.

Small inferior endplate spur at L3.

No fracture, subluxation, or bone destruction.
IMPRESSION: Prior L5 laminectomy.

Degenerative disc disease changes L4-L5.

## 2023-01-04 ENCOUNTER — Ambulatory Visit: Payer: BC Managed Care – PPO | Admitting: Family Medicine

## 2023-01-12 ENCOUNTER — Other Ambulatory Visit: Payer: Self-pay

## 2023-01-12 DIAGNOSIS — M5416 Radiculopathy, lumbar region: Secondary | ICD-10-CM | POA: Diagnosis not present

## 2023-01-12 MED ORDER — NARATRIPTAN HCL 2.5 MG PO TABS: ORAL_TABLET | ORAL | 1 refills | Status: DC

## 2023-01-29 ENCOUNTER — Encounter: Payer: Self-pay | Admitting: *Deleted

## 2023-02-04 ENCOUNTER — Emergency Department (HOSPITAL_BASED_OUTPATIENT_CLINIC_OR_DEPARTMENT_OTHER)
Admission: EM | Admit: 2023-02-04 | Discharge: 2023-02-04 | Disposition: A | Discharge status: Home / Self Care | Payer: BC Managed Care – PPO | Attending: Emergency Medicine | Admitting: Emergency Medicine

## 2023-02-04 ENCOUNTER — Other Ambulatory Visit: Payer: Self-pay

## 2023-02-04 ENCOUNTER — Encounter (HOSPITAL_BASED_OUTPATIENT_CLINIC_OR_DEPARTMENT_OTHER): Payer: Self-pay | Admitting: Emergency Medicine

## 2023-02-04 ENCOUNTER — Emergency Department (HOSPITAL_BASED_OUTPATIENT_CLINIC_OR_DEPARTMENT_OTHER): Payer: BC Managed Care – PPO | Admitting: Radiology

## 2023-02-04 DIAGNOSIS — R0602 Shortness of breath: Secondary | ICD-10-CM | POA: Diagnosis not present

## 2023-02-04 DIAGNOSIS — Z79899 Other long term (current) drug therapy: Secondary | ICD-10-CM | POA: Diagnosis not present

## 2023-02-04 DIAGNOSIS — R0789 Other chest pain: Secondary | ICD-10-CM | POA: Diagnosis not present

## 2023-02-04 DIAGNOSIS — R6884 Jaw pain: Secondary | ICD-10-CM | POA: Insufficient documentation

## 2023-02-04 DIAGNOSIS — Z794 Long term (current) use of insulin: Secondary | ICD-10-CM | POA: Diagnosis not present

## 2023-02-04 DIAGNOSIS — R002 Palpitations: Secondary | ICD-10-CM

## 2023-02-04 DIAGNOSIS — E119 Type 2 diabetes mellitus without complications: Secondary | ICD-10-CM | POA: Diagnosis not present

## 2023-02-04 DIAGNOSIS — R079 Chest pain, unspecified: Secondary | ICD-10-CM

## 2023-02-04 LAB — CBC
HCT: 42.6 % (ref 39.0–52.0)
Hemoglobin: 14.3 g/dL (ref 13.0–17.0)
MCH: 29.1 pg (ref 26.0–34.0)
MCHC: 33.6 g/dL (ref 30.0–36.0)
MCV: 86.6 fL (ref 80.0–100.0)
Platelets: 231 10*3/uL (ref 150–400)
RBC: 4.92 MIL/uL (ref 4.22–5.81)
RDW: 14.6 % (ref 11.5–15.5)
WBC: 6.3 10*3/uL (ref 4.0–10.5)
nRBC: 0 % (ref 0.0–0.2)

## 2023-02-04 LAB — BASIC METABOLIC PANEL
Anion gap: 12 (ref 5–15)
BUN: 21 mg/dL — ABNORMAL HIGH (ref 6–20)
CO2: 24 mmol/L (ref 22–32)
Calcium: 8.9 mg/dL (ref 8.9–10.3)
Chloride: 103 mmol/L (ref 98–111)
Creatinine, Ser: 1.01 mg/dL (ref 0.61–1.24)
GFR, Estimated: >60 mL/min (ref >60)
Glucose, Bld: 80 mg/dL (ref 70–99)
Potassium: 3.9 mmol/L (ref 3.5–5.1)
Sodium: 139 mmol/L (ref 135–145)

## 2023-02-04 LAB — TROPONIN I (HIGH SENSITIVITY)
Troponin I (High Sensitivity): 3 ng/L (ref <18)
Troponin I (High Sensitivity): 3 ng/L (ref <18)

## 2023-02-04 NOTE — Discharge Instructions (Signed)
You were seen in the emergency department for your chest pain and your heart palpitations.  You had been in a normal heart rhythm in the emergency department and had no signs of heart attack or stress on your heart and your electrolytes and chest x-ray were normal.  It is unclear with causing your symptoms at this time but we have given you a referral for cardiology to see if you may need further workup.  They should be giving you a call to schedule your appointment in the next couple of days, you do not hear from them by Monday I would give them a call.  You should return to the emergency department for significantly worsening pain, severe shortness of breath, if you pass out or any other new or concerning symptoms.

## 2023-02-04 NOTE — ED Provider Notes (Signed)
Patient signed out to me at 07 100 by Dr. Madilyn Hook pending labs and reassessment.  In short this is a 57 year old male with a past medical history of diabetes presenting to the emergency department with chest pain.  Patient had chest pain that started around 2 in the morning and has been coming and going since.  EKG on arrival had no acute ischemic changes.  Chest x-ray showed no acute disease.  Labs are pending at this time.  Will need repeat troponin if initial is normal.  Clinical Course as of 02/04/23 0943  Thu Feb 04, 2023  1610 My evaluation, the patient is well-appearing, hemodynamically stable and currently chest pain-free.  Initial troponin is negative.  Will have repeat drawn in about 1 hour. [VK]  9604 Repeat troponin negative, patient is chest pain free. He is stable for discharge home and will be given outpatient cardiology follow up. [VK]    Clinical Course User Index [VK] Rexford Maus, DO      Rexford Maus, [place] 02/04/23 [id number]

## 2023-02-04 NOTE — ED Triage Notes (Signed)
Pt c/o center chest pain tightness 4/10 that started while working out, reports dizziness and flutter; no cardiac hx; recent diagnosis of diabetes

## 2023-02-04 NOTE — ED Provider Notes (Signed)
[place] EMERGENCY DEPARTMENT AT [facility] Provider Note   CSN: [id number] Arrival date & time: 02/04/23  0557     History  Chief Complaint  Patient presents with   Chest Pain    Carlos Ford is a 57 y.o. male.  The history is provided by the patient and medical records.  Chest Pain Carlos Ford is a 57 y.o. male who presents to the Emergency Department complaining of chest pain.  He presents to the emergency department for evaluation of chest pain that started earlier this evening.  He was working out around 2 in the morning and became very briefly lightheaded as an unbalanced sensation with associated chest tightness and some right jaw discomfort.  Since then his symptoms have been waxing and waning and only last a few seconds at a time.  No associated fever.  He also has associated waxing and waning shortness of breath.  No cough.  He was diaphoretic when he was exercising, but this is since resolved.  Unsure if the diaphoresis was due to his workout or the chest discomfort.  No abdominal pain, leg swelling or pain  He has a history of diabetes.  No tobacco, alcohol, drug use.   Father with cad in 65s.      Home Medications Prior to Admission medications   Medication Sig Start Date End Date Taking? Authorizing Provider  BD PEN NEEDLE NANO 2ND GEN 32G X 4 MM MISC USE TO ADMINISTER LANTUS ONCE DAILY AND HUMALOG THREE TIMES DAILY. 11/20/22   McGowen, Maryjean Morn, MD  BOTOX 100 units SOLR injection PROVIDER TO INJECT 155 UNITS INTO THE MUSCLES OF THE HEAD AND NECK EVERY 3 MONTHS. DISCARD REMAINDER. 04/19/19   Lomax, Amy, NP  co-enzyme Q-10 50 MG capsule Take 200 mg by mouth daily.    [provider]  Continuous Glucose Receiver (FREESTYLE LIBRE 2 READER) DEVI 1 each by Does not apply route every 14 (fourteen) days. 08/25/22   McGowen, Maryjean Morn, MD  Continuous Glucose Sensor (FREESTYLE LIBRE 2 SENSOR) MISC 1 EACH BY DOES NOT APPLY ROUTE EVERY 14  (FOURTEEN) DAYS. 10/05/22   McGowen, Maryjean Morn, MD  Cyanocobalamin (VITAMIN B-12 PO) Take by mouth.    [provider]  cyclobenzaprine (FLEXERIL) 10 MG tablet Take 1 tablet (10 mg total) by mouth 3 (three) times daily as needed for muscle spasms. 09/29/21   McGowen, Maryjean Morn, MD  Glucagon 1 MG/0.2ML SOLN 1 ml SQ prn hypoglycemic reaction 11/20/22   McGowen, Maryjean Morn, MD  glucose blood (FREESTYLE PRECISION NEO TEST) test strip Use to check sugar 1-2 times daily. 08/27/22   McGowen, Maryjean Morn, MD  HYDROcodone-acetaminophen (NORCO/VICODIN) 5-325 MG tablet Take 1-2 tablets by mouth every 6 (six) hours as needed for moderate pain. 11/20/22   McGowen, Maryjean Morn, MD  insulin glargine-yfgn (SEMGLEE) 100 UNIT/ML Pen 18 U SQ qhs and gradually increase dose as per provider instructions 10/01/22   Jeoffrey Massed, MD  insulin lispro (HUMALOG KWIKPEN) 100 UNIT/ML KwikPen 10 U qAC tid 10/01/22   McGowen, Maryjean Morn, MD  loratadine (CLARITIN) 10 MG tablet Take 10 mg by mouth daily.    [provider]  Magnesium 500 MG TABS Take 500 mg by mouth daily.    [provider]  Multiple Vitamins-Minerals (ZINC PO) Take 12 mg by mouth.    [provider]  naratriptan (AMERGE) 2.5 MG tablet Take one (1) tablet at onset of headache; if returns or does not resolve, may  repeat after 4 hours; do not exceed five (5) mg in 24 hours. 01/12/23   Lomax, Amy, NP  prochlorperazine (COMPAZINE) 10 MG tablet Take 1 tablet (10 mg total) by mouth every 6 (six) hours as needed for nausea or vomiting. 10/31/19   Lomax, Amy, NP  traZODone (DESYREL) 50 MG tablet TAKE 1-4 TABLETS BY MOUTH AT BEDTIME AS NEEDED FOR INSOMNIA. 10/01/22   McGowen, Maryjean Morn, MD  VITAMIN D PO Take 10,000 Int'l Units by mouth.    [provider]  VITAMIN K PO Take 200 mg by mouth.    [provider]      Allergies    Amoxicillin, Penicillins, Pseudoephedrine, Sulfa antibiotics, and Wellbutrin [bupropion]    Review of Systems    Review of Systems  Cardiovascular:  Positive for chest pain.  All other systems reviewed and are negative.   Physical Exam Updated Vital Signs BP 134/86   Pulse 81   Temp 98.1 F (36.7 C) (Oral)   Resp 20   Ht 5\' 9"  (1.753 m)   Wt 98.4 kg   SpO2 95%   BMI 32.05 kg/m  Physical Exam Vitals and nursing note reviewed.  Constitutional:      Appearance: He is well-developed.  HENT:     Head: Normocephalic and atraumatic.  Cardiovascular:     Rate and Rhythm: Normal rate and regular rhythm.     Heart sounds: No murmur heard. Pulmonary:     Effort: Pulmonary effort is normal. No respiratory distress.     Breath sounds: Normal breath sounds.  Abdominal:     Palpations: Abdomen is soft.     Tenderness: There is no abdominal tenderness. There is no guarding or rebound.  Musculoskeletal:        General: No swelling or tenderness.  Skin:    General: Skin is warm and dry.  Neurological:     Mental Status: He is alert and oriented to person, place, and time.  Psychiatric:        Behavior: Behavior normal.     ED Results / Procedures / Treatments   Labs (all labs ordered are listed, but only abnormal results are displayed) Labs Reviewed  BASIC METABOLIC PANEL  CBC  TROPONIN I (HIGH SENSITIVITY)    EKG None  Radiology No results found.  Procedures Procedures    Medications Ordered in ED Medications - No data to display  ED Course/ Medical Decision Making/ A&P                                 Medical Decision Making Amount and/or Complexity of Data Reviewed Labs: ordered. Radiology: ordered.   Patient here for evaluation of chest pain, has a history of diabetes.  EKG is nonischemic.  Care transferred pending labs and reassessment.        Final Clinical Impression(s) / ED Diagnoses Final diagnoses:  None    Rx / DC Orders ED Discharge Orders     None         Tilden Fossa, MD 02/04/23 [phone]

## 2023-02-04 NOTE — ED Notes (Signed)
Pt states he was working and felt dizzy and began having chest tightness and jaw pain and came immediately here.

## 2023-02-16 ENCOUNTER — Other Ambulatory Visit: Payer: Self-pay

## 2023-02-16 DIAGNOSIS — Z794 Long term (current) use of insulin: Secondary | ICD-10-CM

## 2023-02-17 NOTE — Progress Notes (Unsigned)
02/18/2023 ALL: Carlos Ford returns for Botox.   11/23/2022 ALL: Carlos Ford returns for Botox. He is doing well form migraine standpoint. He was diagnosed with DMT2. A1C was 13. CBGs have improved. He is fasting.   08/24/2022 ALL: Carlos Ford returns for Botox. He continues to do well. He estimates about 1 migraine a month or less. Amerge works well.   05/14/2022 ALL: He returns for Botox. He continues to do well. Has had two migraines over past 12 weeks. Amerge works well.   02/18/2022 ALL: Carlos Ford continues to do well. May have 1 migraine every 12 weeks.   11/24/2021 ALL: he returns for Botox. He continues to do well. He has taken Amerge twice over the past 12 weeks. It continues to work well for abortive therapy.   09/01/2021 ALL: He returns for Botox. He continues to do well. Amerge continues to work well for abortive therapy.   06/05/2021 ALL: He returns for Botox. He continues to do well. Migraines are rare until the last 2-3 weeks prior to Botox. He continues Amerge as needed.   03/05/2021 ALL: Fabiano returns for follow up. He continues to do well. Amerge works well for abortive therapy. He rarely has migraines until 2 weeks prior to next Botox procedure.   11/27/2020 ALL: He returns for Botox. He is doing well. No changes in migraines. He continues Amerge for abortive therapy.   08/26/2020 ALL: He continues to do well on Botox. He does not usually have any migraines until about 1-2 weeks prior to next procedure. Amerge works very well for abortive therapy.   05/16/2020 ALL: He is doing well. Migraines are well managed on Botox. He did not have typical end of cycle worsening of headaches this time. He continues Amerge for abortive therapy.   02/07/2020 ALL: He is doing well. He is now working nights. He does have more headaches toward end of Botox cycle. Amerge helps.   Consent Form Botulism Toxin Injection For Chronic Migraine    Reviewed orally with patient, additionally signature is on file:  Botulism  toxin has been approved by the Federal drug administration for treatment of chronic migraine. Botulism toxin does not cure chronic migraine and it may not be effective in some patients.  The administration of botulism toxin is accomplished by injecting a small amount of toxin into the muscles of the neck and head. Dosage must be titrated for each individual. Any benefits resulting from botulism toxin tend to wear off after 3 months with a repeat injection required if benefit is to be maintained. Injections are usually done every 3-4 months with maximum effect peak achieved by about 2 or 3 weeks. Botulism toxin is expensive and you should be sure of what costs you will incur resulting from the injection.  The side effects of botulism toxin use for chronic migraine may include:   -Transient, and usually mild, facial weakness with facial injections  -Transient, and usually mild, head or neck weakness with head/neck injections  -Reduction or loss of forehead facial animation due to forehead muscle weakness  -Eyelid drooping  -Dry eye  -Pain at the site of injection or bruising at the site of injection  -Double vision  -Potential unknown long term risks   Contraindications: You should not have Botox if you are pregnant, nursing, allergic to albumin, have an infection, skin condition, or muscle weakness at the site of the injection, or have myasthenia gravis, Lambert-Eaton syndrome, or ALS.  It is also possible that as with any injection, there  may be an allergic reaction or no effect from the medication. Reduced effectiveness after repeated injections is sometimes seen and rarely infection at the injection site may occur. All care will be taken to prevent these side effects. If therapy is given over a long time, atrophy and wasting in the muscle injected may occur. Occasionally the patient's become refractory to treatment because they develop antibodies to the toxin. In this event, therapy needs to be  modified.  I have read the above information and consent to the administration of botulism toxin.    BOTOX PROCEDURE NOTE FOR MIGRAINE HEADACHE  Contraindications and precautions discussed with patient(above). Aseptic procedure was observed and patient tolerated procedure. Procedure performed by Shawnie Dapper, FNP-C.   The condition has existed for more than 6 months, and pt does not have a diagnosis of ALS, Myasthenia Gravis or Lambert-Eaton Syndrome.  Risks and benefits of injections discussed and pt agrees to proceed with the procedure.  Written consent obtained  These injections are medically necessary. Pt  receives good benefits from these injections. These injections do not cause sedations or hallucinations which the oral therapies may cause.   Description of procedure:  The patient was placed in a sitting position. The standard protocol was used for Botox as follows, with 5 units of Botox injected at each site:  -Procerus muscle, midline injection  -Corrugator muscle, bilateral injection  -Frontalis muscle, bilateral injection, with 2 sites each side, medial injection was performed in the upper one third of the frontalis muscle, in the region vertical from the medial inferior edge of the superior orbital rim. The lateral injection was again in the upper one third of the forehead vertically above the lateral limbus of the cornea, 1.5 cm lateral to the medial injection site.  -Temporalis muscle injection, 4 sites, bilaterally. The first injection was 3 cm above the tragus of the ear, second injection site was 1.5 cm to 3 cm up from the first injection site in line with the tragus of the ear. The third injection site was 1.5-3 cm forward between the first 2 injection sites. The fourth injection site was 1.5 cm posterior to the second injection site. 5th site laterally in the temporalis  muscleat the level of the outer canthus.  -Occipitalis muscle injection, 3 sites, bilaterally. The first  injection was done one half way between the occipital protuberance and the tip of the mastoid process behind the ear. The second injection site was done lateral and superior to the first, 1 fingerbreadth from the first injection. The third injection site was 1 fingerbreadth superiorly and medially from the first injection site.  -Cervical paraspinal muscle injection, 2 sites, bilaterally. The first injection site was 1 cm from the midline of the cervical spine, 3 cm inferior to the lower border of the occipital protuberance. The second injection site was 1.5 cm superiorly and laterally to the first injection site.  -Trapezius muscle injection was performed at 3 sites, bilaterally. The first injection site was in the upper trapezius muscle halfway between the inflection point of the neck, and the acromion. The second injection site was one half way between the acromion and the first injection site. The third injection was done between the first injection site and the inflection point of the neck.   Will return for repeat injection in 3 months.   A total of 200 units of Botox was prepared, 155 units of Botox was injected as documented above, any Botox not injected was wasted. The patient tolerated  the procedure well, there were no complications of the above procedure.

## 2023-02-18 ENCOUNTER — Telehealth: Payer: Self-pay | Admitting: Family Medicine

## 2023-02-18 ENCOUNTER — Ambulatory Visit: Payer: BC Managed Care – PPO | Admitting: Family Medicine

## 2023-02-18 DIAGNOSIS — G43709 Chronic migraine without aura, not intractable, without status migrainosus: Secondary | ICD-10-CM

## 2023-02-18 MED ORDER — ONABOTULINUMTOXINA 200 UNITS IJ SOLR
155.0000 [IU] | Freq: Once | INTRAMUSCULAR | Status: AC
Start: 2023-02-18 — End: 2023-02-18
  Administered 2023-02-18: 155 [IU] via INTRAMUSCULAR

## 2023-02-18 NOTE — Progress Notes (Signed)
02/18/2023 ALL: Carlos Ford returns for Botox. Migraines remain well managed. Last A1C 5.4. he continues to fast regualrly. He is working out and feels great.   11/23/2022 ALL: Carlos Ford returns for Botox. He is doing well form migraine standpoint. He was diagnosed with DMT2. A1C was 13. CBGs have improved. He is fasting.   08/24/2022 ALL: Carlos Ford returns for Botox. He continues to do well. He estimates about 1 migraine a month or less. Amerge works well.   05/14/2022 ALL: He returns for Botox. He continues to do well. Has had two migraines over past 12 weeks. Amerge works well.   02/18/2022 ALL: Carlos Ford continues to do well. May have 1 migraine every 12 weeks.   11/24/2021 ALL: he returns for Botox. He continues to do well. He has taken Amerge twice over the past 12 weeks. It continues to work well for abortive therapy.   09/01/2021 ALL: He returns for Botox. He continues to do well. Amerge continues to work well for abortive therapy.   06/05/2021 ALL: He returns for Botox. He continues to do well. Migraines are rare until the last 2-3 weeks prior to Botox. He continues Amerge as needed.   03/05/2021 ALL: Carlos Ford returns for follow up. He continues to do well. Amerge works well for abortive therapy. He rarely has migraines until 2 weeks prior to next Botox procedure.   11/27/2020 ALL: He returns for Botox. He is doing well. No changes in migraines. He continues Amerge for abortive therapy.   08/26/2020 ALL: He continues to do well on Botox. He does not usually have any migraines until about 1-2 weeks prior to next procedure. Amerge works very well for abortive therapy.   05/16/2020 ALL: He is doing well. Migraines are well managed on Botox. He did not have typical end of cycle worsening of headaches this time. He continues Amerge for abortive therapy.   02/07/2020 ALL: He is doing well. He is now working nights. He does have more headaches toward end of Botox cycle. Amerge helps.   Consent Form Botulism Toxin  Injection For Chronic Migraine    Reviewed orally with patient, additionally signature is on file:  Botulism toxin has been approved by the Federal drug administration for treatment of chronic migraine. Botulism toxin does not cure chronic migraine and it may not be effective in some patients.  The administration of botulism toxin is accomplished by injecting a small amount of toxin into the muscles of the neck and head. Dosage must be titrated for each individual. Any benefits resulting from botulism toxin tend to wear off after 3 months with a repeat injection required if benefit is to be maintained. Injections are usually done every 3-4 months with maximum effect peak achieved by about 2 or 3 weeks. Botulism toxin is expensive and you should be sure of what costs you will incur resulting from the injection.  The side effects of botulism toxin use for chronic migraine may include:   -Transient, and usually mild, facial weakness with facial injections  -Transient, and usually mild, head or neck weakness with head/neck injections  -Reduction or loss of forehead facial animation due to forehead muscle weakness  -Eyelid drooping  -Dry eye  -Pain at the site of injection or bruising at the site of injection  -Double vision  -Potential unknown long term risks   Contraindications: You should not have Botox if you are pregnant, nursing, allergic to albumin, have an infection, skin condition, or muscle weakness at the site of the injection,  or have myasthenia gravis, Lambert-Eaton syndrome, or ALS.  It is also possible that as with any injection, there may be an allergic reaction or no effect from the medication. Reduced effectiveness after repeated injections is sometimes seen and rarely infection at the injection site may occur. All care will be taken to prevent these side effects. If therapy is given over a long time, atrophy and wasting in the muscle injected may occur. Occasionally the  patient's become refractory to treatment because they develop antibodies to the toxin. In this event, therapy needs to be modified.  I have read the above information and consent to the administration of botulism toxin.    BOTOX PROCEDURE NOTE FOR MIGRAINE HEADACHE  Contraindications and precautions discussed with patient(above). Aseptic procedure was observed and patient tolerated procedure. Procedure performed by Shawnie Dapper, FNP-C.   The condition has existed for more than 6 months, and pt does not have a diagnosis of ALS, Myasthenia Gravis or Lambert-Eaton Syndrome.  Risks and benefits of injections discussed and pt agrees to proceed with the procedure.  Written consent obtained  These injections are medically necessary. Pt  receives good benefits from these injections. These injections do not cause sedations or hallucinations which the oral therapies may cause.   Description of procedure:  The patient was placed in a sitting position. The standard protocol was used for Botox as follows, with 5 units of Botox injected at each site:  -Procerus muscle, midline injection  -Corrugator muscle, bilateral injection  -Frontalis muscle, bilateral injection, with 2 sites each side, medial injection was performed in the upper one third of the frontalis muscle, in the region vertical from the medial inferior edge of the superior orbital rim. The lateral injection was again in the upper one third of the forehead vertically above the lateral limbus of the cornea, 1.5 cm lateral to the medial injection site.  -Temporalis muscle injection, 4 sites, bilaterally. The first injection was 3 cm above the tragus of the ear, second injection site was 1.5 cm to 3 cm up from the first injection site in line with the tragus of the ear. The third injection site was 1.5-3 cm forward between the first 2 injection sites. The fourth injection site was 1.5 cm posterior to the second injection site. 5th site laterally in  the temporalis  muscleat the level of the outer canthus.  -Occipitalis muscle injection, 3 sites, bilaterally. The first injection was done one half way between the occipital protuberance and the tip of the mastoid process behind the ear. The second injection site was done lateral and superior to the first, 1 fingerbreadth from the first injection. The third injection site was 1 fingerbreadth superiorly and medially from the first injection site.  -Cervical paraspinal muscle injection, 2 sites, bilaterally. The first injection site was 1 cm from the midline of the cervical spine, 3 cm inferior to the lower border of the occipital protuberance. The second injection site was 1.5 cm superiorly and laterally to the first injection site.  -Trapezius muscle injection was performed at 3 sites, bilaterally. The first injection site was in the upper trapezius muscle halfway between the inflection point of the neck, and the acromion. The second injection site was one half way between the acromion and the first injection site. The third injection was done between the first injection site and the inflection point of the neck.   Will return for repeat injection in 3 months.   A total of 200 units of Botox was  prepared, 155 units of Botox was injected as documented above, any Botox not injected was wasted. The patient tolerated the procedure well, there were no complications of the above procedure.

## 2023-02-18 NOTE — Telephone Encounter (Signed)
LVM and sent MyChart msg informing pt that today's appt will need r/s, can add on to Amy's morning today or reschedule to another day.

## 2023-02-18 NOTE — Progress Notes (Signed)
Botox- 100 units x 2 vials NWG:[id number] Expiration: 02/2025 NDC: 6578-4696-29  Bacteriostatic 0.9% Sodium Chloride- 2 mL  BMW:[id number] Expiration: 06/15/2023 NDC: 0102-7253-66  Dx: Y40.347 B/B Witnessed QQ:VZDG Darron Doom

## 2023-02-22 ENCOUNTER — Other Ambulatory Visit: Payer: BC Managed Care – PPO

## 2023-02-22 DIAGNOSIS — E1165 Type 2 diabetes mellitus with hyperglycemia: Secondary | ICD-10-CM | POA: Diagnosis not present

## 2023-02-22 DIAGNOSIS — Z794 Long term (current) use of insulin: Secondary | ICD-10-CM | POA: Diagnosis not present

## 2023-02-23 LAB — HEMOGLOBIN A1C
Hgb A1c MFr Bld: 5.3 %{Hb} (ref <5.7)
Mean Plasma Glucose: 105 mg/dL
eAG (mmol/L): 5.8 mmol/L

## 2023-02-23 LAB — LIPID PANEL
Cholesterol: 254 mg/dL — ABNORMAL HIGH (ref <200)
HDL: 51 mg/dL (ref >=40)
LDL Cholesterol (Calc): 183 mg/dL — ABNORMAL HIGH
Non-HDL Cholesterol (Calc): 203 mg/dL — ABNORMAL HIGH (ref <130)
Total CHOL/HDL Ratio: 5 (calc) — ABNORMAL HIGH (ref <5.0)
Triglycerides: 87 mg/dL (ref <150)

## 2023-02-23 LAB — MICROALBUMIN / CREATININE URINE RATIO
Creatinine, Urine: 120 mg/dL (ref 20–320)
Microalb Creat Ratio: 3 mg/g{creat} (ref <30)
Microalb, Ur: 0.4 mg/dL

## 2023-03-08 ENCOUNTER — Encounter: Payer: Self-pay | Admitting: "Endocrinology

## 2023-03-08 ENCOUNTER — Ambulatory Visit (INDEPENDENT_AMBULATORY_CARE_PROVIDER_SITE_OTHER): Payer: BC Managed Care – PPO | Admitting: "Endocrinology

## 2023-03-08 VITALS — BP 122/70 | HR 80 | Ht 69.0 in | Wt 230.6 lb

## 2023-03-08 DIAGNOSIS — E119 Type 2 diabetes mellitus without complications: Secondary | ICD-10-CM | POA: Diagnosis not present

## 2023-03-08 DIAGNOSIS — E7800 Pure hypercholesterolemia, unspecified: Secondary | ICD-10-CM

## 2023-03-08 NOTE — Patient Instructions (Signed)
Discussed about Metformin, Pioglitazone, GLP-1 analog (rybelsus, ozempic, trulicity, mounjaro etc)

## 2023-03-08 NOTE — Progress Notes (Signed)
Outpatient Endocrinology Note Carlos Little Sturgeon, MD  03/08/23   [facility] 1966-02-08 [id number]  Referring Provider: Jeoffrey Massed, MD Primary Care Provider: Jeoffrey Massed, MD Reason for consultation: Subjective   Assessment & Plan  Diagnoses and all orders for this visit:  Controlled type 2 diabetes mellitus without complication, without long-term current use of insulin (HCC)  Pure hypercholesterolemia    Diabetes Type II with no known complications  Lab Results  Component Value Date   GFR 74.97 12/07/2022   Hba1c goal less than 7, current Hba1c is  Lab Results  Component Value Date   HGBA1C 5.3 02/22/2023   Will recommend the following: Discussed about Metformin, Pioglitazone, GLP-1 analog (rybelsus, ozempic, trulicity, mounjaro etc)  Patient is not interested in any preventive medication at this point Patient is trying to control diabetes with lifestyle changes   Last took semglee 18 units every day, Humalog 5 units based on sliding scale around 11/2022   No known contraindications/side effects to any of above medications  -Last LD and Tg are as follows: Lab Results  Component Value Date   LDLCALC 183 (H) 02/22/2023    Lab Results  Component Value Date   TRIG 87 02/22/2023   -not interested in statin at this point  -Follow low fat diet and exercise   -Blood pressure goal <140/90 - Microalbumin/creatinine goal is < 30 -Last MA/Cr is as follows: Lab Results  Component Value Date   MICROALBUR 0.4 02/22/2023   -not on ACE/ARB -diet changes including salt restriction -limit eating outside -counseled BP targets per standards of diabetes care -uncontrolled blood pressure can lead to retinopathy, nephropathy and cardiovascular and atherosclerotic heart disease  Reviewed and counseled on: -A1C target -Blood sugar targets -Complications of uncontrolled diabetes  -Checking blood sugar before meals and bedtime and bring log next  visit -All medications with mechanism of action and side effects -Hypoglycemia management: rule of 15's, Glucagon Emergency Kit and medical alert ID -low-carb low-fat plate-method diet -At least 20 minutes of physical activity per day -Annual dilated retinal eye exam and foot exam -compliance and follow up needs -follow up as scheduled or earlier if problem gets worse  Call if blood sugar is less than 70 or consistently above 250    Take a 15 gm snack of carbohydrate at bedtime before you go to sleep if your blood sugar is less than 100.    If you are going to fast after midnight for a test or procedure, ask your physician for instructions on how to reduce/decrease your insulin dose.    Call if blood sugar is less than 70 or consistently above 250  -Treating a low sugar by rule of 15  (15 gms of sugar every 15 min until sugar is more than 70) If you feel your sugar is low, test your sugar to be sure If your sugar is low (less than 70), then take 15 grams of a fast acting Carbohydrate (3-4 glucose tablets or glucose gel or 4 ounces of juice or regular soda) Recheck your sugar 15 min after treating low to make sure it is more than 70 If sugar is still less than 70, treat again with 15 grams of carbohydrate          Don't drive the hour of hypoglycemia  If unconscious/unable to eat or drink by mouth, use glucagon injection or nasal spray baqsimi and call 911. Can repeat again in 15 min if still unconscious.  Return in about  4 months (around 07/07/2023).   I have reviewed current medications, nurse's notes, allergies, vital signs, past medical and surgical history, family medical history, and social history for this encounter. Counseled patient on symptoms, examination findings, lab findings, imaging results, treatment decisions and monitoring and prognosis. The patient understood the recommendations and agrees with the treatment plan. All questions regarding treatment plan were fully  answered.  Carlos Saltaire, MD  03/08/23    History of Present Illness Carlos Ford is a 57 y.o. year old male who presents for evaluation of Type II diabetes mellitus.  Carlos Ford was first diagnosed in 09/2021.    Home diabetes regimen: Not on anything  Last took semglee 18 units every day, Humalog 5 units based on sliding scale around 11/2022   COMPLICATIONS -  MI/Stroke -  retinopathy -  neuropathy -  nephropathy  BLOOD SUGAR DATA  CGM interpretation: At today's visit, we reviewed her CGM downloads. The full report is scanned in the media. Reviewing the CGM trends, BG are well controlled throughout the day.    Physical Exam  BP 122/70   Pulse 80   Ht 5\' 9"  (1.753 m)   Wt 230 lb 9.6 oz (104.6 kg)   SpO2 97%   BMI 34.05 kg/m    Constitutional: well developed, well nourished Head: normocephalic, atraumatic Eyes: sclera anicteric, no redness Neck: supple Lungs: normal respiratory effort Neurology: alert and oriented Skin: dry, no appreciable rashes Musculoskeletal: no appreciable defects Psychiatric: normal mood and affect Diabetic Foot Exam - Simple   No data filed      Current Medications Patient's Medications  New Prescriptions   No medications on file  Previous Medications   BOTOX 100 UNITS SOLR INJECTION    PROVIDER TO INJECT 155 UNITS INTO THE MUSCLES OF THE HEAD AND NECK EVERY 3 MONTHS. DISCARD REMAINDER.   CO-ENZYME Q-10 50 MG CAPSULE    Take 200 mg by mouth daily.   CONTINUOUS GLUCOSE SENSOR (FREESTYLE LIBRE 2 SENSOR) MISC    1 EACH BY DOES NOT APPLY ROUTE EVERY 14 (FOURTEEN) DAYS.   CYANOCOBALAMIN (VITAMIN B-12 PO)    Take by mouth.   CYCLOBENZAPRINE (FLEXERIL) 10 MG TABLET    Take 1 tablet (10 mg total) by mouth 3 (three) times daily as needed for muscle spasms.   GLUCOSE BLOOD (FREESTYLE PRECISION NEO TEST) TEST STRIP    Use to check sugar 1-2 times daily.   LORATADINE (CLARITIN) 10 MG TABLET    Take 10 mg by mouth daily.    MAGNESIUM 500 MG TABS    Take 500 mg by mouth daily.   MULTIPLE VITAMINS-MINERALS (ZINC PO)    Take 12 mg by mouth.   NARATRIPTAN (AMERGE) 2.5 MG TABLET    Take one (1) tablet at onset of headache; if returns or does not resolve, may repeat after 4 hours; do not exceed five (5) mg in 24 hours.   PROCHLORPERAZINE (COMPAZINE) 10 MG TABLET    Take 1 tablet (10 mg total) by mouth every 6 (six) hours as needed for nausea or vomiting.   TRAZODONE (DESYREL) 50 MG TABLET    TAKE 1-4 TABLETS BY MOUTH AT BEDTIME AS NEEDED FOR INSOMNIA.   VITAMIN D PO    Take 10,000 Int'l Units by mouth.   VITAMIN K PO    Take 200 mg by mouth.  Modified Medications   No medications on file  Discontinued Medications   No medications on file    Allergies Allergies  Allergen Reactions   Amoxicillin    Penicillins    Pseudoephedrine Palpitations   Sulfa Antibiotics Rash   Wellbutrin [Bupropion] Nausea Only, Anxiety and Other (See Comments)    Insomnia and paranoia    Past Medical History Past Medical History:  Diagnosis Date   Achilles tendon disorder    bilat calcific tendonopathy (shockwave therapy, Dr. Jordan Likes)   Chronic low back pain    estab emergeortho 11/2020->DDD/postlaminectomy syndrome   Depression    Diabetes (HCC)    GERD (gastroesophageal reflux disease)    History of adenomatous polyp of colon 2019   Recall 2024   Hyperlipidemia    rec'd statin 04/2020   Hypervitaminosis D 05/03/2020   oversupplementation (10K U tab qd for a couple yrs)   IBS (irritable bowel syndrome)    Insomnia    Migraine syndrome    Dr. Lucia Gaskins    Past Surgical History Past Surgical History:  Procedure Laterality Date   COLONOSCOPY N/A 03/07/2018   2019 Adenoma x 1---recall 2024. Procedure: COLONOSCOPY;  Surgeon: West Bali, MD;  [facility];  Service: Endoscopy;  Laterality: N/A;  10:30   coronary calcium score     09/2022 ZERO   ESOPHAGOGASTRODUODENOSCOPY  2005   LUMBAR EPIDURAL INJECTION  2014    L4-5   LUMBAR LAMINECTOMY  2014   MOUTH SURGERY  2003   POLYPECTOMY  03/07/2018   Procedure: POLYPECTOMY;  Surgeon: West Bali, MD;  Location: [facility];  Service: Endoscopy;;  sigmoid    Family History family history includes Arthritis in his mother; CAD in his father; Cancer in his maternal grandmother; Diabetes in his daughter and father; Early death in his maternal grandfather; Heart attack in his father; Migraines in his mother, sister, and sister.  Social History Social History   Socioeconomic History   Marital status: Married    Spouse name: Not on file   Number of children: Not on file   Years of education: Not on file   Highest education level: Bachelor's degree (e.g., BA, AB, BS)  Occupational History   Occupation: Administrator, arts  Tobacco Use   Smoking status: Never   Smokeless tobacco: Former  Building services engineer status: Never Used  Substance and Sexual Activity   Alcohol use: Yes    Comment: occasional   Drug use: No   Sexual activity: Yes  Other Topics Concern   Not on file  Social History Narrative   Married, 3 daughters.     Orig from [place], relocated to [facility] 2018.   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   No alc.   No drugs.   Hobby: fishing   Social Drivers of Corporate investment banker Strain: Low Risk  (12/07/2022)   Overall Financial Resource Strain (CARDIA)    Difficulty of Paying Living Expenses: Not very hard  Food Insecurity: No Food Insecurity (12/07/2022)   Hunger Vital Sign    Worried About Running Out of Food in the Last Year: Never true    Ran Out of Food in the Last Year: Never true  Transportation Needs: No Transportation Needs (12/07/2022)   PRAPARE - Administrator, Civil Service (Medical): No    Lack of Transportation (Non-Medical): No  Physical Activity: Insufficiently Active (12/07/2022)   Exercise Vital Sign    Days of Exercise per Week: 4 days    Minutes of Exercise per  Session: 30 min  Stress: Not on file  Social Connections: Moderately Integrated (12/07/2022)   Social Connection and Isolation Panel [NHANES]    Frequency of Communication with Friends and Family: Once a week    Frequency of Social Gatherings with Friends and Family: Never    Attends Religious Services: 1 to 4 times per year    Active Member of Clubs or Organizations: Yes    Attends Banker Meetings: 1 to 4 times per year    Marital Status: Married  Catering manager Violence: Not on file    Lab Results  Component Value Date   HGBA1C 5.3 02/22/2023   HGBA1C 5.4 12/07/2022   HGBA1C 5.4 12/07/2022   HGBA1C 5.4 (A) 12/07/2022   HGBA1C 5.4 12/07/2022   Lab Results  Component Value Date   CHOL 254 (H) 02/22/2023   Lab Results  Component Value Date   HDL 51 02/22/2023   Lab Results  Component Value Date   LDLCALC 183 (H) 02/22/2023   Lab Results  Component Value Date   TRIG 87 02/22/2023   Lab Results  Component Value Date   CHOLHDL 5.0 (H) 02/22/2023   Lab Results  Component Value Date   CREATININE 1.01 02/04/2023   Lab Results  Component Value Date   GFR 74.97 12/07/2022   Lab Results  Component Value Date   MICROALBUR 0.4 02/22/2023      Component Value Date/Time   NA 139 02/04/2023 0653   K 3.9 02/04/2023 0653   CL 103 02/04/2023 0653   CO2 24 02/04/2023 0653   GLUCOSE 80 02/04/2023 0653   BUN 21 (H) 02/04/2023 0653   CREATININE 1.01 02/04/2023 0653   CALCIUM 8.9 02/04/2023 0653   PROT 7.9 08/25/2022 1145   ALBUMIN 4.6 08/25/2022 1145   AST 16 08/25/2022 1145   ALT 26 08/25/2022 1145   ALKPHOS 123 (H) 08/25/2022 1145   BILITOT 0.6 08/25/2022 1145   GFRNONAA >60 02/04/2023 0653      Latest Ref Rng & Units 02/04/2023    6:53 AM 12/07/2022    9:44 AM 08/25/2022   11:45 AM  BMP  Glucose 70 - 99 mg/dL 80  74  191   BUN 6 - 20 mg/dL 21  16  14    Creatinine 0.61 - 1.24 mg/dL 4.78  2.95  6.21   Sodium 135 - 145 mmol/L 139  140  136    Potassium 3.5 - 5.1 mmol/L 3.9  4.4  4.3   Chloride 98 - 111 mmol/L 103  102  97   CO2 22 - 32 mmol/L 24  26  29    Calcium 8.9 - 10.3 mg/dL 8.9  9.8  9.6        Component Value Date/Time   WBC 6.3 02/04/2023 0653   RBC 4.92 02/04/2023 0653   HGB 14.3 02/04/2023 0653   HCT 42.6 02/04/2023 0653   PLT 231 02/04/2023 0653   MCV 86.6 02/04/2023 0653   MCH 29.1 02/04/2023 0653   MCHC 33.6 02/04/2023 0653   RDW 14.6 02/04/2023 0653   LYMPHSABS 1.9 05/02/2020 1539   MONOABS 0.5 05/02/2020 1539   EOSABS 0.1 05/02/2020 1539   BASOSABS 0.1 05/02/2020 1539     Parts of this note may have been dictated using voice recognition software. There may be variances in spelling and vocabulary which are unintentional. Not all errors are proofread. Please notify the Thereasa Parkin if any discrepancies are noted or if the meaning of any statement is not clear.

## 2023-03-25 ENCOUNTER — Encounter: Payer: Self-pay | Admitting: Family Medicine

## 2023-03-25 ENCOUNTER — Ambulatory Visit: Payer: BC Managed Care – PPO | Admitting: Family Medicine

## 2023-03-25 VITALS — BP 119/75 | HR 83 | Wt 226.2 lb

## 2023-03-25 DIAGNOSIS — E119 Type 2 diabetes mellitus without complications: Secondary | ICD-10-CM

## 2023-03-25 DIAGNOSIS — E7800 Pure hypercholesterolemia, unspecified: Secondary | ICD-10-CM

## 2023-03-25 DIAGNOSIS — Z23 Encounter for immunization: Secondary | ICD-10-CM

## 2023-03-25 NOTE — Progress Notes (Signed)
 OFFICE VISIT  03/25/2023  CC:  Chief Complaint  Patient presents with   Diabetes    Pt is not fasting.     Patient is a 58 y.o. male who presents for 18-month follow-up diabetes and hypercholesterolemia. A/P as of last visit: #1 newly diagnosed diabetes (initial dx 08/2022, at which time his Hba1c was 11.3% and he had classic symptoms).   He did a very aggressive low-carb diet and we started insulin . Glucoses quickly normalized and in fact he started having hypoglycemia.  Even after stopping insulin  his sugars have remained in the low normal range with relatively frequent nocturnal hypoglycemia. Point-of-care hemoglobin A1c today is 5.4%. Will ask endocrinology to see him.   #2 hypercholesterolemia. Have recommended statin in the past. Lipid panel today.  INTERIM HX: Carlos Ford is feeling well. Home glucoses averaging 95. Saw endocrinologist.  Nothing new recommended. I will follow him for his diabetes. He continues to work on excellent diet and exercise in order to lose weight.  No acute concerns.  Past Medical History:  Diagnosis Date   Achilles tendon disorder    bilat calcific tendonopathy (shockwave therapy, Dr. Chick)   Chronic low back pain    estab emergeortho 11/2020->DDD/postlaminectomy syndrome   Depression    Diabetes (HCC)    GERD (gastroesophageal reflux disease)    History of adenomatous polyp of colon 2019   Recall 2024   Hyperlipidemia    rec'd statin 04/2020   Hypervitaminosis D 05/03/2020   oversupplementation (10K U tab qd for a couple yrs)   IBS (irritable bowel syndrome)    Insomnia    Migraine syndrome    Dr. Ines    Past Surgical History:  Procedure Laterality Date   COLONOSCOPY N/A 03/07/2018   2019 Adenoma x 1---recall 2024. Procedure: COLONOSCOPY;  Surgeon: Harvey Margo CROME, MD;  [facility];  Service: Endoscopy;  Laterality: N/A;  10:30   coronary calcium  score     09/2022 ZERO   ESOPHAGOGASTRODUODENOSCOPY  2005   LUMBAR  EPIDURAL INJECTION  2014   L4-5   LUMBAR LAMINECTOMY  2014   MOUTH SURGERY  2003   POLYPECTOMY  03/07/2018   Procedure: POLYPECTOMY;  Surgeon: Harvey Margo CROME, MD;  [facility];  Service: Endoscopy;;  sigmoid    Outpatient Medications Prior to Visit  Medication Sig Dispense Refill   BOTOX  100 units SOLR injection PROVIDER TO INJECT 155 UNITS INTO THE MUSCLES OF THE HEAD AND NECK EVERY 3 MONTHS. DISCARD REMAINDER. 200 Units 1   co-enzyme Q-10 50 MG capsule Take 200 mg by mouth daily.     Continuous Glucose Sensor (FREESTYLE LIBRE 2 SENSOR) MISC 1 EACH BY DOES NOT APPLY ROUTE EVERY 14 (FOURTEEN) DAYS. 6 each 1   Cyanocobalamin  (VITAMIN B-12 PO) Take by mouth.     cyclobenzaprine  (FLEXERIL ) 10 MG tablet Take 1 tablet (10 mg total) by mouth 3 (three) times daily as needed for muscle spasms. 90 tablet 1   loratadine (CLARITIN) 10 MG tablet Take 10 mg by mouth daily.     Magnesium 500 MG TABS Take 500 mg by mouth daily.     Multiple Vitamins-Minerals (ZINC PO) Take 12 mg by mouth.     naratriptan  (AMERGE) 2.5 MG tablet Take one (1) tablet at onset of headache; if returns or does not resolve, may repeat after 4 hours; do not exceed five (5) mg in 24 hours. 9 tablet 1   prochlorperazine  (COMPAZINE ) 10 MG tablet Take 1 tablet (10 mg  total) by mouth every 6 (six) hours as needed for nausea or vomiting. 30 tablet 0   VITAMIN D  PO Take 10,000 Int'l Units by mouth.     VITAMIN K PO Take 200 mg by mouth.     glucose blood (FREESTYLE PRECISION NEO TEST) test strip Use to check sugar 1-2 times daily. (Patient not taking: Reported on 02/18/2023) 100 each 12   traZODone  (DESYREL ) 50 MG tablet TAKE 1-4 TABLETS BY MOUTH AT BEDTIME AS NEEDED FOR INSOMNIA. (Patient not taking: Reported on 02/18/2023) 360 tablet 1   No facility-administered medications prior to visit.    Allergies  Allergen Reactions   Amoxicillin    Penicillins    Pseudoephedrine Palpitations   Sulfa Antibiotics Rash    Wellbutrin [Bupropion] Nausea Only, Anxiety and Other (See Comments)    Insomnia and paranoia    Review of Systems As per HPI  PE:    03/25/2023    8:18 AM 03/08/2023    8:30 AM 02/04/2023    9:29 AM  Vitals with BMI  Height  5' 9   Weight 226 lbs 3 oz 230 lbs 10 oz   BMI 33.39 34.04   Systolic 119 122 873  Diastolic 75 70 88  Pulse 83 80 75    Physical Exam  Gen: Alert, well appearing.  Patient is oriented to person, place, time, and situation. AFFECT: pleasant, lucid thought and speech. No further exam today  LABS:  Last CBC Lab Results  Component Value Date   WBC 6.3 02/04/2023   HGB 14.3 02/04/2023   HCT 42.6 02/04/2023   MCV 86.6 02/04/2023   MCH 29.1 02/04/2023   RDW 14.6 02/04/2023   PLT 231 02/04/2023   Last metabolic panel Lab Results  Component Value Date   GLUCOSE 80 02/04/2023   NA 139 02/04/2023   K 3.9 02/04/2023   CL 103 02/04/2023   CO2 24 02/04/2023   BUN 21 (H) 02/04/2023   CREATININE 1.01 02/04/2023   GFRNONAA >60 02/04/2023   CALCIUM  8.9 02/04/2023   PROT 7.9 08/25/2022   ALBUMIN 4.6 08/25/2022   BILITOT 0.6 08/25/2022   ALKPHOS 123 (H) 08/25/2022   AST 16 08/25/2022   ALT 26 08/25/2022   ANIONGAP 12 02/04/2023   Last lipids Lab Results  Component Value Date   CHOL 254 (H) 02/22/2023   HDL 51 02/22/2023   LDLCALC 183 (H) 02/22/2023   LDLDIRECT 158.0 09/04/2022   TRIG 87 02/22/2023   CHOLHDL 5.0 (H) 02/22/2023   Last hemoglobin A1c Lab Results  Component Value Date   HGBA1C 5.3 02/22/2023   Last thyroid  functions Lab Results  Component Value Date   TSH 3.88 09/04/2022   Last vitamin D  Lab Results  Component Value Date   VD25OH 55.00 09/29/2021   IMPRESSION AND PLAN:  1) Newly diagnosed diabetes (initial dx 08/2022, at which time his Hba1c was 11.3% and he had classic symptoms).   He did a very aggressive low-carb diet and we started insulin . Glucoses quickly normalized and in fact he started having hypoglycemia.   Even after stopping insulin  his sugars have remained in the low normal range with relatively frequent nocturnal hypoglycemia. Since I last saw him he saw the endocrinologist who reassured him.  He will continue diet and glucose monitoring. If fasting glucose rises consistently above 125 then he will need to get back on medication (options are metformin, pioglitazone, GLP-1 analog).  #2 hypercholesterolemia. Holding off on statin for right now.  Will reevaluate  lipid panel in 6 months and if triglycerides are normal and his diabetes remains in good control he will strongly reconsider getting on a statin.  An After Visit Summary was printed and given to the patient.  FOLLOW UP: No follow-ups on file. Next CPE July 2025 Signed:  Gerlene Hockey, MD           03/25/2023

## 2023-03-29 ENCOUNTER — Telehealth: Payer: Self-pay | Admitting: Family Medicine

## 2023-03-29 NOTE — Telephone Encounter (Signed)
 Pt called re: appointment (botox)needing to be r/s due to a conflict.

## 2023-03-30 DIAGNOSIS — M5416 Radiculopathy, lumbar region: Secondary | ICD-10-CM | POA: Diagnosis not present

## 2023-04-05 ENCOUNTER — Other Ambulatory Visit: Payer: Self-pay

## 2023-04-05 MED ORDER — NARATRIPTAN HCL 2.5 MG PO TABS: ORAL_TABLET | ORAL | 1 refills | Status: DC

## 2023-04-06 ENCOUNTER — Other Ambulatory Visit: Payer: Self-pay | Admitting: Family Medicine

## 2023-04-22 ENCOUNTER — Telehealth: Payer: Self-pay | Admitting: Family Medicine

## 2023-04-22 NOTE — Telephone Encounter (Signed)
 Attempted 2x to call the # for provider services on the back of pt's card. Each time I received a recording stating you have reached us  outside of our regular business hours. Attempted calling alternative # [phone] found in chart and was able to get through to a rep.

## 2023-04-26 NOTE — Telephone Encounter (Signed)
 Initiated auth over the phone with rep Sophia, pending clinical review. I faxed notes to [phone] with pending case # [id number]. Reference # for call is [id number].

## 2023-04-27 DIAGNOSIS — M5416 Radiculopathy, lumbar region: Secondary | ICD-10-CM | POA: Diagnosis not present

## 2023-05-03 NOTE — Telephone Encounter (Signed)
Called to check status of auth, rep states this is still pending with a determination date of 05/11/23. They did not receive the notes I faxed so I verified fax # of [phone] and resent them. Rep states I was given incorrect pending case # before, the correct case # is [id number].

## 2023-05-10 ENCOUNTER — Telehealth: Payer: Self-pay | Admitting: Family Medicine

## 2023-05-10 NOTE — Telephone Encounter (Signed)
 Appointment details discussed, pt will leave appointment in place for now even with scheduled Jury Duty on this appointment date.

## 2023-05-11 NOTE — Telephone Encounter (Signed)
 Called [employer] to check status, Carlos Ford was approved. Reference # for call is [id number].  Auth#: [id number] (04/26/23-03/15/24)

## 2023-05-11 NOTE — Progress Notes (Signed)
 05/17/2023 ALL: Carlos Ford returns for Botox. He continues to do well. May have 3-4 migraines towad the end of Botox cycle. Amerge works well for abortive therapy.   02/18/2023 ALL: Carlos Ford returns for Botox. Migraines remain well managed. Last A1C 5.4. he continues to fast regualrly. He is working out and feels great.   11/23/2022 ALL: Carlos Ford returns for Botox. He is doing well form migraine standpoint. He was diagnosed with DMT2. A1C was 13. CBGs have improved. He is fasting.   08/24/2022 ALL: Carlos Ford returns for Botox. He continues to do well. He estimates about 1 migraine a month or less. Amerge works well.   05/14/2022 ALL: He returns for Botox. He continues to do well. Has had two migraines over past 12 weeks. Amerge works well.   02/18/2022 ALL: Carlos Ford continues to do well. May have 1 migraine every 12 weeks.   11/24/2021 ALL: he returns for Botox. He continues to do well. He has taken Amerge twice over the past 12 weeks. It continues to work well for abortive therapy.   09/01/2021 ALL: He returns for Botox. He continues to do well. Amerge continues to work well for abortive therapy.   06/05/2021 ALL: He returns for Botox. He continues to do well. Migraines are rare until the last 2-3 weeks prior to Botox. He continues Amerge as needed.   03/05/2021 ALL: Carlos Ford returns for follow up. He continues to do well. Amerge works well for abortive therapy. He rarely has migraines until 2 weeks prior to next Botox procedure.   11/27/2020 ALL: He returns for Botox. He is doing well. No changes in migraines. He continues Amerge for abortive therapy.   08/26/2020 ALL: He continues to do well on Botox. He does not usually have any migraines until about 1-2 weeks prior to next procedure. Amerge works very well for abortive therapy.   05/16/2020 ALL: He is doing well. Migraines are well managed on Botox. He did not have typical end of cycle worsening of headaches this time. He continues Amerge for abortive therapy.    02/07/2020 ALL: He is doing well. He is now working nights. He does have more headaches toward end of Botox cycle. Amerge helps.   Consent Form Botulism Toxin Injection For Chronic Migraine    Reviewed orally with patient, additionally signature is on file:  Botulism toxin has been approved by the Federal drug administration for treatment of chronic migraine. Botulism toxin does not cure chronic migraine and it may not be effective in some patients.  The administration of botulism toxin is accomplished by injecting a small amount of toxin into the muscles of the neck and head. Dosage must be titrated for each individual. Any benefits resulting from botulism toxin tend to wear off after 3 months with a repeat injection required if benefit is to be maintained. Injections are usually done every 3-4 months with maximum effect peak achieved by about 2 or 3 weeks. Botulism toxin is expensive and you should be sure of what costs you will incur resulting from the injection.  The side effects of botulism toxin use for chronic migraine may include:   -Transient, and usually mild, facial weakness with facial injections  -Transient, and usually mild, head or neck weakness with head/neck injections  -Reduction or loss of forehead facial animation due to forehead muscle weakness  -Eyelid drooping  -Dry eye  -Pain at the site of injection or bruising at the site of injection  -Double vision  -Potential unknown long term risks  Contraindications: You should not have Botox if you are pregnant, nursing, allergic to albumin, have an infection, skin condition, or muscle weakness at the site of the injection, or have myasthenia gravis, Lambert-Eaton syndrome, or ALS.  It is also possible that as with any injection, there may be an allergic reaction or no effect from the medication. Reduced effectiveness after repeated injections is sometimes seen and rarely infection at the injection site may occur. All  care will be taken to prevent these side effects. If therapy is given over a long time, atrophy and wasting in the muscle injected may occur. Occasionally the patient's become refractory to treatment because they develop antibodies to the toxin. In this event, therapy needs to be modified.  I have read the above information and consent to the administration of botulism toxin.    BOTOX PROCEDURE NOTE FOR MIGRAINE HEADACHE  Contraindications and precautions discussed with patient(above). Aseptic procedure was observed and patient tolerated procedure. Procedure performed by Shawnie Dapper, FNP-C.   The condition has existed for more than 6 months, and pt does not have a diagnosis of ALS, Myasthenia Gravis or Lambert-Eaton Syndrome.  Risks and benefits of injections discussed and pt agrees to proceed with the procedure.  Written consent obtained  These injections are medically necessary. Pt  receives good benefits from these injections. These injections do not cause sedations or hallucinations which the oral therapies may cause.   Description of procedure:  The patient was placed in a sitting position. The standard protocol was used for Botox as follows, with 5 units of Botox injected at each site:  -Procerus muscle, midline injection  -Corrugator muscle, bilateral injection  -Frontalis muscle, bilateral injection, with 2 sites each side, medial injection was performed in the upper one third of the frontalis muscle, in the region vertical from the medial inferior edge of the superior orbital rim. The lateral injection was again in the upper one third of the forehead vertically above the lateral limbus of the cornea, 1.5 cm lateral to the medial injection site.  -Temporalis muscle injection, 4 sites, bilaterally. The first injection was 3 cm above the tragus of the ear, second injection site was 1.5 cm to 3 cm up from the first injection site in line with the tragus of the ear. The third injection  site was 1.5-3 cm forward between the first 2 injection sites. The fourth injection site was 1.5 cm posterior to the second injection site. 5th site laterally in the temporalis  muscleat the level of the outer canthus.  -Occipitalis muscle injection, 3 sites, bilaterally. The first injection was done one half way between the occipital protuberance and the tip of the mastoid process behind the ear. The second injection site was done lateral and superior to the first, 1 fingerbreadth from the first injection. The third injection site was 1 fingerbreadth superiorly and medially from the first injection site.  -Cervical paraspinal muscle injection, 2 sites, bilaterally. The first injection site was 1 cm from the midline of the cervical spine, 3 cm inferior to the lower border of the occipital protuberance. The second injection site was 1.5 cm superiorly and laterally to the first injection site.  -Trapezius muscle injection was performed at 3 sites, bilaterally. The first injection site was in the upper trapezius muscle halfway between the inflection point of the neck, and the acromion. The second injection site was one half way between the acromion and the first injection site. The third injection was done between the first injection  site and the inflection point of the neck.   Will return for repeat injection in 3 months.   A total of 200 units of Botox was prepared, 155 units of Botox was injected as documented above, any Botox not injected was wasted. The patient tolerated the procedure well, there were no complications of the above procedure.

## 2023-05-13 ENCOUNTER — Ambulatory Visit: Payer: BC Managed Care – PPO | Admitting: Family Medicine

## 2023-05-17 ENCOUNTER — Ambulatory Visit: Payer: BC Managed Care – PPO | Admitting: Family Medicine

## 2023-05-17 VITALS — BP 118/82

## 2023-05-17 DIAGNOSIS — G43709 Chronic migraine without aura, not intractable, without status migrainosus: Secondary | ICD-10-CM | POA: Diagnosis not present

## 2023-05-17 MED ORDER — ONABOTULINUMTOXINA 200 UNITS IJ SOLR
155.0000 [IU] | Freq: Once | INTRAMUSCULAR | Status: AC
Start: 2023-05-17 — End: 2023-05-17
  Administered 2023-05-17: 155 [IU] via INTRAMUSCULAR

## 2023-05-17 NOTE — Progress Notes (Signed)
 Botox- 200 units x 1 vial Lot: [id number] Expiration: 2027/04 NDC: 0023-3921-02  Bacteriostatic 0.9% Sodium Chloride- 4mL  Lot: [id number] Expiration: 01/15/24 NDC: [id number]  Dx:  Z30.865  B/B Witnessed by [facility] RN

## 2023-06-02 ENCOUNTER — Other Ambulatory Visit: Payer: Self-pay | Admitting: Family Medicine

## 2023-06-02 DIAGNOSIS — E119 Type 2 diabetes mellitus without complications: Secondary | ICD-10-CM

## 2023-06-29 ENCOUNTER — Ambulatory Visit: Payer: BC Managed Care – PPO | Admitting: "Endocrinology

## 2023-07-22 ENCOUNTER — Telehealth: Payer: Self-pay | Admitting: *Deleted

## 2023-07-22 MED ORDER — NARATRIPTAN HCL 2.5 MG PO TABS: ORAL_TABLET | ORAL | 1 refills | Status: DC

## 2023-07-26 ENCOUNTER — Encounter: Payer: Self-pay | Admitting: Family Medicine

## 2023-07-26 NOTE — Telephone Encounter (Signed)
 I recommend you avoid taking creatine supplement.

## 2023-08-11 NOTE — Progress Notes (Unsigned)
 08/12/2023 ALL: Carlos Ford returns for Botox . He continues to do well. Migraines remain well managed until last week. Amerge continues to work well.   05/17/2023 ALL: Carlos Ford returns for Botox . He continues to do well. May have 3-4 migraines towad the end of Botox  cycle. Amerge works well for abortive therapy.   02/18/2023 ALL: Carlos Ford returns for Botox . Migraines remain well managed. Last A1C 5.4. he continues to fast regualrly. He is working out and feels great.   11/23/2022 ALL: Carlos Ford returns for Botox . He is doing well form migraine standpoint. He was diagnosed with DMT2. A1C was 13. CBGs have improved. He is fasting.   08/24/2022 ALL: Carlos Ford returns for Botox . He continues to do well. He estimates about 1 migraine a month or less. Amerge works well.   05/14/2022 ALL: He returns for Botox . He continues to do well. Has had two migraines over past 12 weeks. Amerge works well.   02/18/2022 ALL: Carlos Ford continues to do well. May have 1 migraine every 12 weeks.   11/24/2021 ALL: he returns for Botox . He continues to do well. He has taken Amerge twice over the past 12 weeks. It continues to work well for abortive therapy.   09/01/2021 ALL: He returns for Botox . He continues to do well. Amerge continues to work well for abortive therapy.   06/05/2021 ALL: He returns for Botox . He continues to do well. Migraines are rare until the last 2-3 weeks prior to Botox . He continues Amerge as needed.   03/05/2021 ALL: Carlos Ford returns for follow up. He continues to do well. Amerge works well for abortive therapy. He rarely has migraines until 2 weeks prior to next Botox  procedure.   11/27/2020 ALL: He returns for Botox . He is doing well. No changes in migraines. He continues Amerge for abortive therapy.   08/26/2020 ALL: He continues to do well on Botox . He does not usually have any migraines until about 1-2 weeks prior to next procedure. Amerge works very well for abortive therapy.   05/16/2020 ALL: He is doing well. Migraines are  well managed on Botox . He did not have typical end of cycle worsening of headaches this time. He continues Amerge for abortive therapy.   02/07/2020 ALL: He is doing well. He is now working nights. He does have more headaches toward end of Botox  cycle. Amerge helps.   Consent Form Botulism Toxin Injection For Chronic Migraine    Reviewed orally with patient, additionally signature is on file:  Botulism toxin has been approved by the Federal drug administration for treatment of chronic migraine. Botulism toxin does not cure chronic migraine and it may not be effective in some patients.  The administration of botulism toxin is accomplished by injecting a small amount of toxin into the muscles of the neck and head. Dosage must be titrated for each individual. Any benefits resulting from botulism toxin tend to wear off after 3 months with a repeat injection required if benefit is to be maintained. Injections are usually done every 3-4 months with maximum effect peak achieved by about 2 or 3 weeks. Botulism toxin is expensive and you should be sure of what costs you will incur resulting from the injection.  The side effects of botulism toxin use for chronic migraine may include:   -Transient, and usually mild, facial weakness with facial injections  -Transient, and usually mild, head or neck weakness with head/neck injections  -Reduction or loss of forehead facial animation due to forehead muscle weakness  -Eyelid drooping  -Dry  eye  -Pain at the site of injection or bruising at the site of injection  -Double vision  -Potential unknown long term risks   Contraindications: You should not have Botox  if you are pregnant, nursing, allergic to albumin, have an infection, skin condition, or muscle weakness at the site of the injection, or have myasthenia gravis, Lambert-Eaton syndrome, or ALS.  It is also possible that as with any injection, there may be an allergic reaction or no effect from the  medication. Reduced effectiveness after repeated injections is sometimes seen and rarely infection at the injection site may occur. All care will be taken to prevent these side effects. If therapy is given over a long time, atrophy and wasting in the muscle injected may occur. Occasionally the patient's become refractory to treatment because they develop antibodies to the toxin. In this event, therapy needs to be modified.  I have read the above information and consent to the administration of botulism toxin.    BOTOX  PROCEDURE NOTE FOR MIGRAINE HEADACHE  Contraindications and precautions discussed with patient(above). Aseptic procedure was observed and patient tolerated procedure. Procedure performed by Terrilyn Fick, FNP-C.   The condition has existed for more than 6 months, and pt does not have a diagnosis of ALS, Myasthenia Gravis or Lambert-Eaton Syndrome.  Risks and benefits of injections discussed and pt agrees to proceed with the procedure.  Written consent obtained  These injections are medically necessary. Pt  receives good benefits from these injections. These injections do not cause sedations or hallucinations which the oral therapies may cause.   Description of procedure:  The patient was placed in a sitting position. The standard protocol was used for Botox  as follows, with 5 units of Botox  injected at each site:  -Procerus muscle, midline injection  -Corrugator muscle, bilateral injection  -Frontalis muscle, bilateral injection, with 2 sites each side, medial injection was performed in the upper one third of the frontalis muscle, in the region vertical from the medial inferior edge of the superior orbital rim. The lateral injection was again in the upper one third of the forehead vertically above the lateral limbus of the cornea, 1.5 cm lateral to the medial injection site.  -Temporalis muscle injection, 4 sites, bilaterally. The first injection was 3 cm above the tragus of the  ear, second injection site was 1.5 cm to 3 cm up from the first injection site in line with the tragus of the ear. The third injection site was 1.5-3 cm forward between the first 2 injection sites. The fourth injection site was 1.5 cm posterior to the second injection site. 5th site laterally in the temporalis  muscleat the level of the outer canthus.  -Occipitalis muscle injection, 3 sites, bilaterally. The first injection was done one half way between the occipital protuberance and the tip of the mastoid process behind the ear. The second injection site was done lateral and superior to the first, 1 fingerbreadth from the first injection. The third injection site was 1 fingerbreadth superiorly and medially from the first injection site.  -Cervical paraspinal muscle injection, 2 sites, bilaterally. The first injection site was 1 cm from the midline of the cervical spine, 3 cm inferior to the lower border of the occipital protuberance. The second injection site was 1.5 cm superiorly and laterally to the first injection site.  -Trapezius muscle injection was performed at 3 sites, bilaterally. The first injection site was in the upper trapezius muscle halfway between the inflection point of the neck, and the  acromion. The second injection site was one half way between the acromion and the first injection site. The third injection was done between the first injection site and the inflection point of the neck.   Will return for repeat injection in 3 months.   A total of 200 units of Botox  was prepared, 155 units of Botox  was injected as documented above, any Botox  not injected was wasted. The patient tolerated the procedure well, there were no complications of the above procedure.

## 2023-08-12 ENCOUNTER — Ambulatory Visit: Admitting: Family Medicine

## 2023-08-12 VITALS — BP 110/60 | HR 78 | Ht 69.0 in | Wt 216.2 lb

## 2023-08-12 DIAGNOSIS — G43709 Chronic migraine without aura, not intractable, without status migrainosus: Secondary | ICD-10-CM

## 2023-08-12 MED ORDER — ONABOTULINUMTOXINA 200 UNITS IJ SOLR
155.0000 [IU] | Freq: Once | INTRAMUSCULAR | Status: AC
  Administered 2023-08-12: 155 [IU] via INTRAMUSCULAR

## 2023-08-12 NOTE — Progress Notes (Signed)
 Botox -200U x 1vial Lot: [id number] Expiration: 06/2025 NDC: 1308-6578-46  Bacteriostatic 0.9% Sodium Chloride - 4mL total Lot: [id number] Expiration: 09/12/2024 NDC: 8413-2440-10  Buy/bill  Witnessed by: Marcelline Sermons, CMA

## 2023-09-22 ENCOUNTER — Telehealth: Payer: Self-pay | Admitting: Family Medicine

## 2023-09-22 MED ORDER — NARATRIPTAN HCL 2.5 MG PO TABS: ORAL_TABLET | ORAL | 1 refills | Status: DC

## 2023-09-22 NOTE — Telephone Encounter (Signed)
 Lisa Case Manager at [facility] called stating that she just got off the phone with the pt and he is needing an emergency refill on his naratriptan  (AMERGE) 2.5 MG tablet. Pt was on vacation and left the medication behind and is now experiencing a headache and has no medication. Please advise.

## 2023-09-22 NOTE — Telephone Encounter (Signed)
 Last saw Amy for botox  08/12/23. Next appt 11/17/23.    I called pt to find out which pharmacy he would like refill to go to. Pharmacy: [id number] - [place], [place] - [address]. E-scribed refill.

## 2023-09-23 DIAGNOSIS — M5416 Radiculopathy, lumbar region: Secondary | ICD-10-CM | POA: Diagnosis not present

## 2023-09-23 DIAGNOSIS — Z1331 Encounter for screening for depression: Secondary | ICD-10-CM | POA: Diagnosis not present

## 2023-10-03 ENCOUNTER — Encounter: Payer: Self-pay | Admitting: Family Medicine

## 2023-10-03 DIAGNOSIS — E119 Type 2 diabetes mellitus without complications: Secondary | ICD-10-CM

## 2023-10-03 DIAGNOSIS — Z125 Encounter for screening for malignant neoplasm of prostate: Secondary | ICD-10-CM

## 2023-10-03 DIAGNOSIS — E7800 Pure hypercholesterolemia, unspecified: Secondary | ICD-10-CM

## 2023-10-04 NOTE — Telephone Encounter (Signed)
 Sure.  Either day is fine.  Orders are in.

## 2023-10-05 ENCOUNTER — Other Ambulatory Visit (INDEPENDENT_AMBULATORY_CARE_PROVIDER_SITE_OTHER)

## 2023-10-05 DIAGNOSIS — E119 Type 2 diabetes mellitus without complications: Secondary | ICD-10-CM

## 2023-10-05 DIAGNOSIS — E7800 Pure hypercholesterolemia, unspecified: Secondary | ICD-10-CM

## 2023-10-05 DIAGNOSIS — Z125 Encounter for screening for malignant neoplasm of prostate: Secondary | ICD-10-CM | POA: Diagnosis not present

## 2023-10-05 LAB — CBC WITH DIFFERENTIAL/PLATELET
Basophils Absolute: 0 K/uL (ref 0.0–0.1)
Basophils Relative: 0.5 % (ref 0.0–3.0)
Eosinophils Absolute: 0.1 K/uL (ref 0.0–0.7)
Eosinophils Relative: 2.5 % (ref 0.0–5.0)
HCT: 43.3 % (ref 39.0–52.0)
Hemoglobin: 14.7 g/dL (ref 13.0–17.0)
Lymphocytes Relative: 24.9 % (ref 12.0–46.0)
Lymphs Abs: 1.3 K/uL (ref 0.7–4.0)
MCHC: 33.9 g/dL (ref 30.0–36.0)
MCV: 87.7 fl (ref 78.0–100.0)
Monocytes Absolute: 0.4 K/uL (ref 0.1–1.0)
Monocytes Relative: 7.5 % (ref 3.0–12.0)
Neutro Abs: 3.4 K/uL (ref 1.4–7.7)
Neutrophils Relative %: 64.6 % (ref 43.0–77.0)
Platelets: 223 K/uL (ref 150.0–400.0)
RBC: 4.94 Mil/uL (ref 4.22–5.81)
RDW: 14.5 % (ref 11.5–15.5)
WBC: 5.3 K/uL (ref 4.0–10.5)

## 2023-10-05 LAB — HEMOGLOBIN A1C: Hgb A1c MFr Bld: 5.2 % (ref 4.6–6.5)

## 2023-10-05 LAB — LIPID PANEL
Cholesterol: 256 mg/dL — ABNORMAL HIGH (ref 0–200)
HDL: 60.2 mg/dL (ref >39.00)
LDL Cholesterol: 179 mg/dL — ABNORMAL HIGH (ref 0–99)
NonHDL: 195.75
Total CHOL/HDL Ratio: 4
Triglycerides: 86 mg/dL (ref 0.0–149.0)
VLDL: 17.2 mg/dL (ref 0.0–40.0)

## 2023-10-05 LAB — COMPREHENSIVE METABOLIC PANEL WITH GFR
ALT: 21 U/L (ref 0–53)
AST: 20 U/L (ref 0–37)
Albumin: 4.6 g/dL (ref 3.5–5.2)
Alkaline Phosphatase: 61 U/L (ref 39–117)
BUN: 18 mg/dL (ref 6–23)
CO2: 31 meq/L (ref 19–32)
Calcium: 9.4 mg/dL (ref 8.4–10.5)
Chloride: 101 meq/L (ref 96–112)
Creatinine, Ser: 1.03 mg/dL (ref 0.40–1.50)
GFR: 80.66 mL/min (ref >60.00)
Glucose, Bld: 87 mg/dL (ref 70–99)
Potassium: 4.7 meq/L (ref 3.5–5.1)
Sodium: 139 meq/L (ref 135–145)
Total Bilirubin: 0.8 mg/dL (ref 0.2–1.2)
Total Protein: 7.1 g/dL (ref 6.0–8.3)

## 2023-10-05 LAB — PSA: PSA: 4.35 ng/mL — ABNORMAL HIGH (ref 0.10–4.00)

## 2023-10-08 ENCOUNTER — Ambulatory Visit: Payer: BC Managed Care – PPO | Admitting: Family Medicine

## 2023-10-08 ENCOUNTER — Encounter: Payer: Self-pay | Admitting: Family Medicine

## 2023-10-08 VITALS — BP 104/65 | HR 75 | Temp 98.0°F | Ht 69.75 in | Wt 213.4 lb

## 2023-10-08 DIAGNOSIS — R972 Elevated prostate specific antigen [PSA]: Secondary | ICD-10-CM | POA: Diagnosis not present

## 2023-10-08 DIAGNOSIS — N398 Other specified disorders of urinary system: Secondary | ICD-10-CM | POA: Diagnosis not present

## 2023-10-08 DIAGNOSIS — Z1211 Encounter for screening for malignant neoplasm of colon: Secondary | ICD-10-CM | POA: Diagnosis not present

## 2023-10-08 DIAGNOSIS — E119 Type 2 diabetes mellitus without complications: Secondary | ICD-10-CM

## 2023-10-08 DIAGNOSIS — E7800 Pure hypercholesterolemia, unspecified: Secondary | ICD-10-CM | POA: Diagnosis not present

## 2023-10-08 MED ORDER — TAMSULOSIN HCL 0.4 MG PO CAPS: 0.4000 mg | ORAL_CAPSULE | Freq: Every day | ORAL | 3 refills | Status: DC

## 2023-10-08 NOTE — Progress Notes (Signed)
 Office Note 10/08/2023  CC:  Chief Complaint  Patient presents with   Annual Exam    Pt is not fasting; labs done prior   Patient is a 58 y.o. male who is here for 82-month follow-up diabetes and hypercholesterolemia. A/P as of last visit: 1) Newly diagnosed diabetes (initial dx 08/2022, at which time his Hba1c was 11.3% and he had classic symptoms).   He did a very aggressive low-carb diet and we started insulin . Glucoses quickly normalized and in fact he started having hypoglycemia.  Even after stopping insulin  his sugars have remained in the low normal range with relatively frequent nocturnal hypoglycemia. Since I last saw him he saw the endocrinologist who reassured him.  He will continue diet and glucose monitoring. If fasting glucose rises consistently above 125 then he will need to get back on medication (options are metformin, pioglitazone, GLP-1 analog).   #2 hypercholesterolemia. Holding off on statin for right now.  Will reevaluate lipid panel in 6 months and if triglycerides are normal and his diabetes remains in good control he will strongly reconsider getting on a statin.  INTERIM HX: Feeling well.  Still doing regular workouts and working on [employer]. He had been eating a high fat diet but in the last months has started to cut out a lot of this.  Glucoses normal.  He has approximately 3 months of urinary hesitancy, weak stream, dribbling.  Denies urinary frequency or urgency.  No nocturia. No blood in urine, no pain with voiding. Denies any sense of incomplete emptying.    Past Medical History:  Diagnosis Date   Achilles tendon disorder    bilat calcific tendonopathy (shockwave therapy, Dr. Chick)   Chronic low back pain    estab emergeortho 11/2020->DDD/postlaminectomy syndrome   Depression    Diabetes (HCC)    GERD (gastroesophageal reflux disease)    History of adenomatous polyp of colon 2019   Recall 2024   Hyperlipidemia    rec'd statin 04/2020    Hypervitaminosis D 05/03/2020   oversupplementation (10K U tab qd for a couple yrs)   IBS (irritable bowel syndrome)    Insomnia    Migraine syndrome    Dr. Ines    Past Surgical History:  Procedure Laterality Date   COLONOSCOPY N/A 03/07/2018   2019 Adenoma x 1---recall 2024. Procedure: COLONOSCOPY;  Surgeon: Harvey Margo CROME, MD;  Location: [facility];  Service: Endoscopy;  Laterality: N/A;  10:30   coronary calcium  score     09/2022 ZERO   Coronary calcium  score     09/2022 ZERO   ESOPHAGOGASTRODUODENOSCOPY  2005   EYE SURGERY  2000   Lasik   LUMBAR EPIDURAL INJECTION  2014   L4-5   LUMBAR LAMINECTOMY  2014   MOUTH SURGERY  2003   POLYPECTOMY  03/07/2018   Procedure: POLYPECTOMY;  Surgeon: Harvey Margo CROME, MD;  Location: [facility];  Service: Endoscopy;;  sigmoid   SPINE SURGERY  2001   2 micrdiskectomies, 1 lamenectomy    Family History  Problem Relation Age of Onset   Migraines Mother    Arthritis Mother    Diabetes Father    CAD Father    Heart attack Father    Migraines Sister    Migraines Sister    Diabetes Daughter    Cancer Maternal Grandmother    Early death Maternal Grandfather    Colon cancer Neg Hx    Colon polyps Neg Hx     Social History  Socioeconomic History   Marital status: Married    Spouse name: Not on file   Number of children: Not on file   Years of education: Not on file   Highest education level: Bachelor's degree (e.g., BA, AB, BS)  Occupational History   Occupation: Administrator, arts  Tobacco Use   Smoking status: Never   Smokeless tobacco: Former  Building services engineer status: Never Used  Substance and Sexual Activity   Alcohol use: Yes    Comment: occasional   Drug use: No   Sexual activity: Yes  Other Topics Concern   Not on file  Social History Narrative   Married, 3 daughters.     Orig from [place] , relocated to [facility] 2018.   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   No alc.   No  drugs.   Hobby: fishing   Social Drivers of Corporate investment banker Strain: Low Risk  (09/23/2023)   Received from [facility]   Overall Financial Resource Strain (CARDIA)    Difficulty of Paying Living Expenses: Not hard at all  Food Insecurity: No Food Insecurity (09/23/2023)   Received from [facility]   Hunger Vital Sign    Within the past 12 months, you worried that your food would run out before you got the money to buy more.: Never true    Within the past 12 months, the food you bought just didn't last and you didn't have money to get more.: Never true  Transportation Needs: No Transportation Needs (09/23/2023)   Received from [facility] - Transportation    Lack of Transportation (Medical): No    Lack of Transportation (Non-Medical): No  Physical Activity: Insufficiently Active (12/07/2022)   Exercise Vital Sign    Days of Exercise per Week: 4 days    Minutes of Exercise per Session: 30 min  Stress: Not on file  Social Connections: Moderately Integrated (12/07/2022)   Social Connection and Isolation Panel    Frequency of Communication with Friends and Family: Once a week    Frequency of Social Gatherings with Friends and Family: Never    Attends Religious Services: 1 to 4 times per year    Active Member of [employer] or Organizations: Yes    Attends Banker Meetings: 1 to 4 times per year    Marital Status: Married  Catering manager Violence: Not on file    Outpatient Medications Prior to Visit  Medication Sig Dispense Refill   BOTOX  100 units SOLR injection PROVIDER TO INJECT 155 UNITS INTO THE MUSCLES OF THE HEAD AND NECK EVERY 3 MONTHS. DISCARD REMAINDER. 200 Units 1   co-enzyme Q-10 50 MG capsule Take 200 mg by mouth daily.     Cyanocobalamin  (VITAMIN B-12 PO) Take by mouth.     cyclobenzaprine  (FLEXERIL ) 10 MG tablet Take 1 tablet (10 mg total) by mouth 3 (three) times daily as needed for muscle spasms. 90 tablet 1   glucose blood  (FREESTYLE PRECISION NEO TEST) test strip Use to check sugar 1-2 times daily. 100 each 12   Magnesium 500 MG TABS Take 500 mg by mouth daily.     Multiple Vitamins-Minerals (ZINC PO) Take 12 mg by mouth.     naratriptan  (AMERGE) 2.5 MG tablet Take one (1) tablet at onset of headache; if returns or does not resolve, may repeat after 4 hours; do not exceed five (5) mg in 24 hours. 9 tablet 1   VITAMIN D   PO Take 10,000 Int'l Units by mouth.     VITAMIN K PO Take 200 mg by mouth.     Continuous Glucose Sensor (FREESTYLE LIBRE 2 SENSOR) MISC 1 EACH BY DOES NOT APPLY ROUTE EVERY 14 (FOURTEEN) DAYS. 6 each 0   prochlorperazine  (COMPAZINE ) 10 MG tablet Take 1 tablet (10 mg total) by mouth every 6 (six) hours as needed for nausea or vomiting. (Patient not taking: Reported on 10/08/2023) 30 tablet 0   loratadine (CLARITIN) 10 MG tablet Take 10 mg by mouth daily. (Patient not taking: Reported on 10/08/2023)     No facility-administered medications prior to visit.    Allergies  Allergen Reactions   Amoxicillin    Penicillins    Pseudoephedrine Palpitations   Sulfa Antibiotics Rash   Wellbutrin [Bupropion] Nausea Only, Anxiety and Other (See Comments)    Insomnia and paranoia    Review of Systems  Constitutional:  Negative for appetite change, chills, fatigue and fever.  HENT:  Negative for congestion, dental problem, ear pain and sore throat.   Eyes:  Negative for discharge, redness and visual disturbance.  Respiratory:  Negative for cough, chest tightness, shortness of breath and wheezing.   Cardiovascular:  Negative for chest pain, palpitations and leg swelling.  Gastrointestinal:  Negative for abdominal pain, blood in stool, diarrhea, nausea and vomiting.  Genitourinary:  Positive for difficulty urinating. Negative for dysuria, flank pain, frequency, hematuria and urgency.  Musculoskeletal:  Negative for arthralgias, back pain, joint swelling, myalgias and neck stiffness.  Skin:  Negative for  pallor and rash.  Neurological:  Negative for dizziness, speech difficulty, weakness and headaches.  Hematological:  Negative for adenopathy. Does not bruise/bleed easily.  Psychiatric/Behavioral:  Negative for confusion and sleep disturbance. The patient is not nervous/anxious.     PE;    10/08/2023    8:47 AM 08/12/2023    3:35 PM 05/17/2023    3:36 PM  Vitals with BMI  Height 5' 9.75 5' 9   Weight 213 lbs 6 oz 216 lbs 3 oz   BMI 30.83 31.91   Systolic 104 110 881  Diastolic 65 60 82  Pulse 75 78    Gen: Alert, well appearing.  Patient is oriented to person, place, time, and situation. Rectal exam: negative without mass, lesions or tenderness, PROSTATE EXAM: smooth and symmetric without nodules or tenderness, normal in size. Foot exam -  no swelling, tenderness or skin or vascular lesions. Color and temperature is normal. Sensation is intact. Peripheral pulses are palpable. Toenails are normal.  Pertinent labs:  Lab Results  Component Value Date   TSH 3.88 09/04/2022   Lab Results  Component Value Date   WBC 5.3 10/05/2023   HGB 14.7 10/05/2023   HCT 43.3 10/05/2023   MCV 87.7 10/05/2023   PLT 223.0 10/05/2023   Lab Results  Component Value Date   CREATININE 1.03 10/05/2023   BUN 18 10/05/2023   NA 139 10/05/2023   K 4.7 10/05/2023   CL 101 10/05/2023   CO2 31 10/05/2023   Lab Results  Component Value Date   ALT 21 10/05/2023   AST 20 10/05/2023   ALKPHOS 61 10/05/2023   BILITOT 0.8 10/05/2023   Lab Results  Component Value Date   CHOL 256 (H) 10/05/2023   Lab Results  Component Value Date   HDL 60.20 10/05/2023   Lab Results  Component Value Date   LDLCALC 179 (H) 10/05/2023   Lab Results  Component Value Date   TRIG  86.0 10/05/2023   Lab Results  Component Value Date   CHOLHDL 4 10/05/2023   Lab Results  Component Value Date   PSA 4.35 (H) 10/05/2023   PSA 2.36 09/29/2021   PSA 1.98 05/02/2020   Lab Results  Component Value Date    HGBA1C 5.2 10/05/2023   ASSESSMENT AND PLAN:   #1 diabetes without complication. Excellent control with diet and exercise. Hemoglobin A1c 5.2.  #2 Hyperlipidemia.  Patient wants to continue to work on low-fat/low-cholesterol diet and increasing exercise and losing more weight. If this does not help in the next 6 months he is open to starting statin medication.  #3  Lower urinary tract obstructive symptoms.  Suspect BPH.  Low suspicion acute prostatitis. Trial of Flomax 0.4 mg daily.  #4 Elevated PSA, 4.35.  Refer to urology today.  #5 Preventative health: Vaccines: Shingrix-->deferred.  Prevnar->deferred.  Otherwise UTD. Labs: PSA,CBC, cmet, lipids, TSH, and A1c all done recently.  See labs section for details). Prostate ca screening: PSA 10/05/23 slightly elevated at 4.35--> urology referral.. Colon ca screening: History of polyps 2019.  Due for recall as of 2024.  He is no longer near his GI MD/office.  New referral closer to home ordered today.  An After Visit Summary was printed and given to the patient.  FOLLOW UP:  Return in about 3 months (around 01/08/2024) for routine chronic illness f/u.  Signed:  Gerlene Hockey, MD           10/08/2023

## 2023-10-08 NOTE — Patient Instructions (Signed)
 Health Maintenance, Male  Adopting a healthy lifestyle and getting preventive care are important in promoting health and wellness. Ask your health care provider about:  The right schedule for you to have regular tests and exams.  Things you can do on your own to prevent diseases and keep yourself healthy.  What should I know about diet, weight, and exercise?  Eat a healthy diet    Eat a diet that includes plenty of vegetables, fruits, low-fat dairy products, and lean protein.  Do not eat a lot of foods that are high in solid fats, added sugars, or sodium.  Maintain a healthy weight  Body mass index (BMI) is a measurement that can be used to identify possible weight problems. It estimates body fat based on height and weight. Your health care provider can help determine your BMI and help you achieve or maintain a healthy weight.  Get regular exercise  Get regular exercise. This is one of the most important things you can do for your health. Most adults should:  Exercise for at least 150 minutes each week. The exercise should increase your heart rate and make you sweat (moderate-intensity exercise).  Do strengthening exercises at least twice a week. This is in addition to the moderate-intensity exercise.  Spend less time sitting. Even light physical activity can be beneficial.  Watch cholesterol and blood lipids  Have your blood tested for lipids and cholesterol at 57 years of age, then have this test every 5 years.  You may need to have your cholesterol levels checked more often if:  Your lipid or cholesterol levels are high.  You are older than 57 years of age.  You are at high risk for heart disease.  What should I know about cancer screening?  Many types of cancers can be detected early and may often be prevented. Depending on your health history and family history, you may need to have cancer screening at various ages. This may include screening for:  Colorectal cancer.  Prostate cancer.  Skin cancer.  Lung  cancer.  What should I know about heart disease, diabetes, and high blood pressure?  Blood pressure and heart disease  High blood pressure causes heart disease and increases the risk of stroke. This is more likely to develop in people who have high blood pressure readings or are overweight.  Talk with your health care provider about your target blood pressure readings.  Have your blood pressure checked:  Every 3-5 years if you are 9-95 years of age.  Every year if you are 85 years old or older.  If you are between the ages of 29 and 29 and are a current or former smoker, ask your health care provider if you should have a one-time screening for abdominal aortic aneurysm (AAA).  Diabetes  Have regular diabetes screenings. This checks your fasting blood sugar level. Have the screening done:  Once every three years after age 23 if you are at a normal weight and have a low risk for diabetes.  More often and at a younger age if you are overweight or have a high risk for diabetes.  What should I know about preventing infection?  Hepatitis B  If you have a higher risk for hepatitis B, you should be screened for this virus. Talk with your health care provider to find out if you are at risk for hepatitis B infection.  Hepatitis C  Blood testing is recommended for:  Everyone born from [birth year] through 1964.  Anyone  with known risk factors for hepatitis C.  Sexually transmitted infections (STIs)  You should be screened each year for STIs, including gonorrhea and chlamydia, if:  You are sexually active and are younger than 57 years of age.  You are older than 57 years of age and your health care provider tells you that you are at risk for this type of infection.  Your sexual activity has changed since you were last screened, and you are at increased risk for chlamydia or gonorrhea. Ask your health care provider if you are at risk.  Ask your health care provider about whether you are at high risk for HIV. Your health care provider  may recommend a prescription medicine to help prevent HIV infection. If you choose to take medicine to prevent HIV, you should first get tested for HIV. You should then be tested every 3 months for as long as you are taking the medicine.  Follow these instructions at home:  Alcohol use  Do not drink alcohol if your health care provider tells you not to drink.  If you drink alcohol:  Limit how much you have to 0-2 drinks a day.  Know how much alcohol is in your drink. In the [place] one drink equals one 12 oz bottle of beer (355 mL), one 5 oz glass of wine (148 mL), or one 1 oz glass of hard liquor (44 mL).  Lifestyle  Do not use any products that contain nicotine or tobacco. These products include cigarettes, chewing tobacco, and vaping devices, such as e-cigarettes. If you need help quitting, ask your health care provider.  Do not use street drugs.  Do not share needles.  Ask your health care provider for help if you need support or information about quitting drugs.  General instructions  Schedule regular health, dental, and eye exams.  Stay current with your vaccines.  Tell your health care provider if:  You often feel depressed.  You have ever been abused or do not feel safe at home.  Summary  Adopting a healthy lifestyle and getting preventive care are important in promoting health and wellness.  Follow your health care provider's instructions about healthy diet, exercising, and getting tested or screened for diseases.  Follow your health care provider's instructions on monitoring your cholesterol and blood pressure.  This information is not intended to replace advice given to you by your health care provider. Make sure you discuss any questions you have with your health care provider.  Document Revised: 07/22/2020 Document Reviewed: 07/22/2020  Elsevier Patient Education  2024 [employer].

## 2023-10-13 LAB — HM DIABETES EYE EXAM

## 2023-11-03 DIAGNOSIS — M5416 Radiculopathy, lumbar region: Secondary | ICD-10-CM | POA: Diagnosis not present

## 2023-11-16 NOTE — Progress Notes (Unsigned)
 11/17/2023 ALL: Carlos Ford returns for Botox . He reports more headaches over the past three months. Relates to more back pain and not sleeping well. Amerge works well.   08/12/2023 ALL: Carlos Ford returns for Botox . He continues to do well. Migraines remain well managed until last week. Amerge continues to work well.   05/17/2023 ALL: Carlos Ford returns for Botox . He continues to do well. May have 3-4 migraines towad the end of Botox  cycle. Amerge works well for abortive therapy.   02/18/2023 ALL: Carlos Ford returns for Botox . Migraines remain well managed. Last A1C 5.4. he continues to fast regualrly. He is working out and feels great.   11/23/2022 ALL: Carlos Ford returns for Botox . He is doing well form migraine standpoint. He was diagnosed with DMT2. A1C was 13. CBGs have improved. He is fasting.   08/24/2022 ALL: Carlos Ford returns for Botox . He continues to do well. He estimates about 1 migraine a month or less. Amerge works well.   05/14/2022 ALL: He returns for Botox . He continues to do well. Has had two migraines over past 12 weeks. Amerge works well.   02/18/2022 ALL: Carlos Ford continues to do well. May have 1 migraine every 12 weeks.   11/24/2021 ALL: he returns for Botox . He continues to do well. He has taken Amerge twice over the past 12 weeks. It continues to work well for abortive therapy.   09/01/2021 ALL: He returns for Botox . He continues to do well. Amerge continues to work well for abortive therapy.   06/05/2021 ALL: He returns for Botox . He continues to do well. Migraines are rare until the last 2-3 weeks prior to Botox . He continues Amerge as needed.   03/05/2021 ALL: Gregori returns for follow up. He continues to do well. Amerge works well for abortive therapy. He rarely has migraines until 2 weeks prior to next Botox  procedure.   11/27/2020 ALL: He returns for Botox . He is doing well. No changes in migraines. He continues Amerge for abortive therapy.   08/26/2020 ALL: He continues to do well on Botox . He does not usually  have any migraines until about 1-2 weeks prior to next procedure. Amerge works very well for abortive therapy.   05/16/2020 ALL: He is doing well. Migraines are well managed on Botox . He did not have typical end of cycle worsening of headaches this time. He continues Amerge for abortive therapy.   02/07/2020 ALL: He is doing well. He is now working nights. He does have more headaches toward end of Botox  cycle. Amerge helps.   Consent Form Botulism Toxin Injection For Chronic Migraine    Reviewed orally with patient, additionally signature is on file:  Botulism toxin has been approved by the Federal drug administration for treatment of chronic migraine. Botulism toxin does not cure chronic migraine and it may not be effective in some patients.  The administration of botulism toxin is accomplished by injecting a small amount of toxin into the muscles of the neck and head. Dosage must be titrated for each individual. Any benefits resulting from botulism toxin tend to wear off after 3 months with a repeat injection required if benefit is to be maintained. Injections are usually done every 3-4 months with maximum effect peak achieved by about 2 or 3 weeks. Botulism toxin is expensive and you should be sure of what costs you will incur resulting from the injection.  The side effects of botulism toxin use for chronic migraine may include:   -Transient, and usually mild, facial weakness with facial injections  -  Transient, and usually mild, head or neck weakness with head/neck injections  -Reduction or loss of forehead facial animation due to forehead muscle weakness  -Eyelid drooping  -Dry eye  -Pain at the site of injection or bruising at the site of injection  -Double vision  -Potential unknown long term risks   Contraindications: You should not have Botox  if you are pregnant, nursing, allergic to albumin, have an infection, skin condition, or muscle weakness at the site of the injection, or  have myasthenia gravis, Lambert-Eaton syndrome, or ALS.  It is also possible that as with any injection, there may be an allergic reaction or no effect from the medication. Reduced effectiveness after repeated injections is sometimes seen and rarely infection at the injection site may occur. All care will be taken to prevent these side effects. If therapy is given over a long time, atrophy and wasting in the muscle injected may occur. Occasionally the patient's become refractory to treatment because they develop antibodies to the toxin. In this event, therapy needs to be modified.  I have read the above information and consent to the administration of botulism toxin.    BOTOX  PROCEDURE NOTE FOR MIGRAINE HEADACHE  Contraindications and precautions discussed with patient(above). Aseptic procedure was observed and patient tolerated procedure. Procedure performed by Greig Forbes, FNP-C.   The condition has existed for more than 6 months, and pt does not have a diagnosis of ALS, Myasthenia Gravis or Lambert-Eaton Syndrome.  Risks and benefits of injections discussed and pt agrees to proceed with the procedure.  Written consent obtained  These injections are medically necessary. Pt  receives good benefits from these injections. These injections do not cause sedations or hallucinations which the oral therapies may cause.   Description of procedure:  The patient was placed in a sitting position. The standard protocol was used for Botox  as follows, with 5 units of Botox  injected at each site:  -Procerus muscle, midline injection  -Corrugator muscle, bilateral injection  -Frontalis muscle, bilateral injection, with 2 sites each side, medial injection was performed in the upper one third of the frontalis muscle, in the region vertical from the medial inferior edge of the superior orbital rim. The lateral injection was again in the upper one third of the forehead vertically above the lateral limbus of the  cornea, 1.5 cm lateral to the medial injection site.  -Temporalis muscle injection, 4 sites, bilaterally. The first injection was 3 cm above the tragus of the ear, second injection site was 1.5 cm to 3 cm up from the first injection site in line with the tragus of the ear. The third injection site was 1.5-3 cm forward between the first 2 injection sites. The fourth injection site was 1.5 cm posterior to the second injection site. 5th site laterally in the temporalis  muscleat the level of the outer canthus.  -Occipitalis muscle injection, 3 sites, bilaterally. The first injection was done one half way between the occipital protuberance and the tip of the mastoid process behind the ear. The second injection site was done lateral and superior to the first, 1 fingerbreadth from the first injection. The third injection site was 1 fingerbreadth superiorly and medially from the first injection site.  -Cervical paraspinal muscle injection, 2 sites, bilaterally. The first injection site was 1 cm from the midline of the cervical spine, 3 cm inferior to the lower border of the occipital protuberance. The second injection site was 1.5 cm superiorly and laterally to the first injection site.  -  Trapezius muscle injection was performed at 3 sites, bilaterally. The first injection site was in the upper trapezius muscle halfway between the inflection point of the neck, and the acromion. The second injection site was one half way between the acromion and the first injection site. The third injection was done between the first injection site and the inflection point of the neck.   Will return for repeat injection in 3 months.   A total of 200 units of Botox  was prepared, 155 units of Botox  was injected as documented above, any Botox  not injected was wasted. The patient tolerated the procedure well, there were no complications of the above procedure.

## 2023-11-17 ENCOUNTER — Encounter: Payer: Self-pay | Admitting: Family Medicine

## 2023-11-17 ENCOUNTER — Ambulatory Visit (INDEPENDENT_AMBULATORY_CARE_PROVIDER_SITE_OTHER): Admitting: Family Medicine

## 2023-11-17 VITALS — BP 122/82 | HR 75

## 2023-11-17 DIAGNOSIS — G43709 Chronic migraine without aura, not intractable, without status migrainosus: Secondary | ICD-10-CM | POA: Diagnosis not present

## 2023-11-17 MED ORDER — ONABOTULINUMTOXINA 200 UNITS IJ SOLR
155.0000 [IU] | Freq: Once | INTRAMUSCULAR | Status: AC
  Administered 2023-11-17: 155 [IU] via INTRAMUSCULAR

## 2023-11-17 NOTE — Progress Notes (Signed)
 Botox -200U x 1vial Lot: [id number] Expiration: 01/2026 NDC: 9976-6078-97  Bacteriostatic 0.9% Sodium Chloride - 4mL total Onu:[id number] Expiration:01/15/2024 NDC: 9590-8033-97  Buy/bill  Witnessed by: Delon ORN, CMA  Dx: [phone]

## 2023-11-18 DIAGNOSIS — M5416 Radiculopathy, lumbar region: Secondary | ICD-10-CM | POA: Diagnosis not present

## 2023-11-19 ENCOUNTER — Other Ambulatory Visit: Payer: Self-pay

## 2023-11-19 DIAGNOSIS — M5416 Radiculopathy, lumbar region: Secondary | ICD-10-CM

## 2023-11-21 DIAGNOSIS — J019 Acute sinusitis, unspecified: Secondary | ICD-10-CM | POA: Diagnosis not present

## 2023-11-21 DIAGNOSIS — H6993 Unspecified Eustachian tube disorder, bilateral: Secondary | ICD-10-CM | POA: Diagnosis not present

## 2023-11-25 ENCOUNTER — Ambulatory Visit
Admission: RE | Admit: 2023-11-25 | Discharge: 2023-11-25 | Disposition: A | Discharge status: Home / Self Care | Source: Ambulatory Visit

## 2023-11-25 DIAGNOSIS — M5416 Radiculopathy, lumbar region: Secondary | ICD-10-CM

## 2023-11-25 DIAGNOSIS — M4727 Other spondylosis with radiculopathy, lumbosacral region: Secondary | ICD-10-CM | POA: Diagnosis not present

## 2023-11-25 DIAGNOSIS — M5116 Intervertebral disc disorders with radiculopathy, lumbar region: Secondary | ICD-10-CM | POA: Diagnosis not present

## 2023-11-25 DIAGNOSIS — M4807 Spinal stenosis, lumbosacral region: Secondary | ICD-10-CM | POA: Diagnosis not present

## 2023-12-01 DIAGNOSIS — N401 Enlarged prostate with lower urinary tract symptoms: Secondary | ICD-10-CM | POA: Diagnosis not present

## 2023-12-01 DIAGNOSIS — R3914 Feeling of incomplete bladder emptying: Secondary | ICD-10-CM | POA: Diagnosis not present

## 2023-12-01 DIAGNOSIS — R3912 Poor urinary stream: Secondary | ICD-10-CM | POA: Diagnosis not present

## 2023-12-01 DIAGNOSIS — R972 Elevated prostate specific antigen [PSA]: Secondary | ICD-10-CM | POA: Diagnosis not present

## 2023-12-02 ENCOUNTER — Other Ambulatory Visit: Payer: Self-pay | Admitting: Urology

## 2023-12-02 DIAGNOSIS — R972 Elevated prostate specific antigen [PSA]: Secondary | ICD-10-CM

## 2023-12-03 ENCOUNTER — Encounter: Payer: Self-pay | Admitting: Urology

## 2023-12-17 DIAGNOSIS — M48062 Spinal stenosis, lumbar region with neurogenic claudication: Secondary | ICD-10-CM | POA: Insufficient documentation

## 2023-12-17 DIAGNOSIS — M5431 Sciatica, right side: Secondary | ICD-10-CM | POA: Diagnosis not present

## 2023-12-17 DIAGNOSIS — M5416 Radiculopathy, lumbar region: Secondary | ICD-10-CM | POA: Diagnosis not present

## 2023-12-17 DIAGNOSIS — M4726 Other spondylosis with radiculopathy, lumbar region: Secondary | ICD-10-CM | POA: Insufficient documentation

## 2023-12-17 DIAGNOSIS — M961 Postlaminectomy syndrome, not elsewhere classified: Secondary | ICD-10-CM | POA: Insufficient documentation

## 2023-12-17 DIAGNOSIS — M5432 Sciatica, left side: Secondary | ICD-10-CM | POA: Diagnosis not present

## 2023-12-20 ENCOUNTER — Other Ambulatory Visit: Payer: Self-pay | Admitting: *Deleted

## 2023-12-20 MED ORDER — NARATRIPTAN HCL 2.5 MG PO TABS: ORAL_TABLET | ORAL | 1 refills | Status: DC

## 2023-12-20 NOTE — Telephone Encounter (Signed)
 Last seen on 11/17/23 Follow up scheduled on 02/21/24

## 2023-12-27 ENCOUNTER — Encounter: Payer: Self-pay | Admitting: Family Medicine

## 2024-01-03 ENCOUNTER — Encounter (HOSPITAL_BASED_OUTPATIENT_CLINIC_OR_DEPARTMENT_OTHER): Payer: Self-pay

## 2024-01-03 ENCOUNTER — Emergency Department (HOSPITAL_BASED_OUTPATIENT_CLINIC_OR_DEPARTMENT_OTHER)
Admission: EM | Admit: 2024-01-03 | Discharge: 2024-01-03 | Disposition: A | Discharge status: Home / Self Care | Source: Ambulatory Visit | Attending: Emergency Medicine | Admitting: Emergency Medicine

## 2024-01-03 ENCOUNTER — Ambulatory Visit: Payer: Self-pay

## 2024-01-03 ENCOUNTER — Other Ambulatory Visit: Payer: Self-pay

## 2024-01-03 DIAGNOSIS — Z79899 Other long term (current) drug therapy: Secondary | ICD-10-CM | POA: Diagnosis not present

## 2024-01-03 DIAGNOSIS — R339 Retention of urine, unspecified: Secondary | ICD-10-CM | POA: Insufficient documentation

## 2024-01-03 LAB — BASIC METABOLIC PANEL WITH GFR
Anion gap: 11 (ref 5–15)
BUN: 19 mg/dL (ref 6–20)
CO2: 24 mmol/L (ref 22–32)
Calcium: 9.7 mg/dL (ref 8.9–10.3)
Chloride: 105 mmol/L (ref 98–111)
Creatinine, Ser: 1.04 mg/dL (ref 0.61–1.24)
GFR, Estimated: >60 mL/min (ref >60)
Glucose, Bld: 105 mg/dL — ABNORMAL HIGH (ref 70–99)
Potassium: 4.1 mmol/L (ref 3.5–5.1)
Sodium: 140 mmol/L (ref 135–145)

## 2024-01-03 LAB — CBC WITH DIFFERENTIAL/PLATELET
Abs Immature Granulocytes: 0.02 K/uL (ref 0.00–0.07)
Basophils Absolute: 0 K/uL (ref 0.0–0.1)
Basophils Relative: 1 %
Eosinophils Absolute: 0.1 K/uL (ref 0.0–0.5)
Eosinophils Relative: 2 %
HCT: 43 % (ref 39.0–52.0)
Hemoglobin: 14.9 g/dL (ref 13.0–17.0)
Immature Granulocytes: 0 %
Lymphocytes Relative: 27 %
Lymphs Abs: 1.4 K/uL (ref 0.7–4.0)
MCH: 29.8 pg (ref 26.0–34.0)
MCHC: 34.7 g/dL (ref 30.0–36.0)
MCV: 86 fL (ref 80.0–100.0)
Monocytes Absolute: 0.4 K/uL (ref 0.1–1.0)
Monocytes Relative: 7 %
Neutro Abs: 3.3 K/uL (ref 1.7–7.7)
Neutrophils Relative %: 63 %
Platelets: 197 K/uL (ref 150–400)
RBC: 5 MIL/uL (ref 4.22–5.81)
RDW: 13.2 % (ref 11.5–15.5)
WBC: 5.2 K/uL (ref 4.0–10.5)
nRBC: 0 % (ref 0.0–0.2)

## 2024-01-03 LAB — URINALYSIS, W/ REFLEX TO CULTURE (INFECTION SUSPECTED)
Bacteria, UA: NONE SEEN
Bilirubin Urine: NEGATIVE
Glucose, UA: NEGATIVE mg/dL
Hgb urine dipstick: NEGATIVE
Ketones, ur: NEGATIVE mg/dL
Leukocytes,Ua: NEGATIVE
Nitrite: NEGATIVE
Protein, ur: NEGATIVE mg/dL
Specific Gravity, Urine: 1.016 (ref 1.005–1.030)
pH: 5.5 (ref 5.0–8.0)

## 2024-01-03 MED ORDER — TAMSULOSIN HCL 0.4 MG PO CAPS: 0.4000 mg | ORAL_CAPSULE | Freq: Every day | ORAL | 0 refills | Status: DC

## 2024-01-03 NOTE — ED Triage Notes (Signed)
 Presents to ED with c/o urinary retention since this AM. Getting MRI on prostate on Thursday.

## 2024-01-03 NOTE — Telephone Encounter (Signed)
 FYI Only or Action Required?: FYI only for provider.  Patient was last seen in primary care on 10/08/2023 by McGowen, Aleene DEL, MD.  Called Nurse Triage reporting Urine Output.  Symptoms began yesterday.  Interventions attempted: Rest, hydration, or home remedies.  Symptoms are: gradually worsening.  Triage Disposition: Go to ED Now (Notify PCP)  Patient/caregiver understands and will follow disposition?: Yes      Copied from CRM [id number]. Topic: Clinical - Red Word Triage >> Jan 03, 2024  8:18 AM Mercedes MATSU wrote: Red Word that prompted transfer to Nurse Triage: Patient called in c/o difficulty urinating, he also states that his kidneys feel abnormal and that he has gain 10lbs fairly quickly. Patient is concerned and requesting a same day appointment. Reason for Disposition  [1] Unable to urinate (or only a few drops) > 4 hours AND [2] bladder feels very full (e.g., palpable bladder or strong urge to urinate)  Answer Assessment - Initial Assessment Questions 1. SYMPTOM: What's the main symptom you're concerned about? (e.g., frequency, incontinence)     Decrease in urinary output 2. ONSET: When did the  symptoms  start?     Worsening symptoms 3. PAIN: Is there any pain? If Yes, ask: How bad is it? (Scale: 1-10; mild, moderate, severe)     None 4. CAUSE: What do you think is causing the symptoms?     Unknown 5. OTHER SYMPTOMS: Do you have any other symptoms? (e.g., blood in urine, fever, flank pain, pain with urination)     LBP  Protocols used: Urinary Symptoms-A-AH

## 2024-01-03 NOTE — ED Notes (Signed)
 400 mls of urine produced after foley

## 2024-01-03 NOTE — ED Provider Notes (Signed)
 [place] EMERGENCY DEPARTMENT AT [facility] Provider Note   CSN: [id number] Arrival date & time: 01/03/24  9082     Patient presents with: Urinary Retention   Carlos Ford is a 58 y.o. male.   Patient to ED reporting bilateral flank pressure type discomfort that started today. He states his urinary stream this morning was a dribble only but feels he emptied his bladder. No hematuria. He is followed by urology in evaluation of prostate issues, pending MRI in 3 days as next step in evaluation. He also reports longstanding low back condition with previous back surgeries. No fever.   The history is provided by the patient. No language interpreter was used.       Prior to Admission medications   Medication Sig Start Date End Date Taking? Authorizing Provider  BOTOX  100 units SOLR injection PROVIDER TO INJECT 155 UNITS INTO THE MUSCLES OF THE HEAD AND NECK EVERY 3 MONTHS. DISCARD REMAINDER. 04/19/19   Lomax, Amy, NP  co-enzyme Q-10 50 MG capsule Take 200 mg by mouth daily.    [provider]  Continuous Glucose Sensor (FREESTYLE LIBRE 2 SENSOR) MISC 1 EACH BY DOES NOT APPLY ROUTE EVERY 14 (FOURTEEN) DAYS. 06/03/23   McGowen, Aleene DEL, MD  Cyanocobalamin  (VITAMIN B-12 PO) Take by mouth.    [provider]  cyclobenzaprine  (FLEXERIL ) 10 MG tablet Take 1 tablet (10 mg total) by mouth 3 (three) times daily as needed for muscle spasms. 09/29/21   McGowen, Aleene DEL, MD  glucose blood (FREESTYLE PRECISION NEO TEST) test strip Use to check sugar 1-2 times daily. 08/27/22   McGowen, Aleene DEL, MD  Magnesium 500 MG TABS Take 500 mg by mouth daily.    [provider]  Multiple Vitamins-Minerals (ZINC PO) Take 12 mg by mouth.    [provider]  naratriptan  (AMERGE) 2.5 MG tablet Take one (1) tablet at onset of headache; if returns or does not resolve, may repeat after 4 hours; do not exceed five (5) mg in 24 hours. 12/20/23   Lomax, Amy, NP   prochlorperazine  (COMPAZINE ) 10 MG tablet Take 1 tablet (10 mg total) by mouth every 6 (six) hours as needed for nausea or vomiting. 10/31/19   Lomax, Amy, NP  tamsulosin  (FLOMAX ) 0.4 MG CAPS capsule Take 1 capsule (0.4 mg total) by mouth daily. 01/03/24   Odell Balls, PA-C  VITAMIN D  PO Take 10,000 Int'l Units by mouth. Patient taking differently: Take 5,000 Int'l Units by mouth.    [provider]  VITAMIN K PO Take 200 mg by mouth.    [provider]    Allergies: Amoxicillin, Penicillins, Pseudoephedrine, Sulfa antibiotics, and Wellbutrin [bupropion]    Review of Systems  Updated Vital Signs BP (!) 134/96   Pulse 80   Temp 98.1 F (36.7 C) (Oral)   Resp 15   Ht 5' 9 (1.753 m)   Wt 95.3 kg   SpO2 98%   BMI 31.01 kg/m   Physical Exam Constitutional:      Appearance: He is well-developed.  HENT:     Head: Normocephalic.  Cardiovascular:     Rate and Rhythm: Normal rate.  Pulmonary:     Effort: Pulmonary effort is normal.  Abdominal:     General: Bowel sounds are normal. There is no distension.     Palpations: Abdomen is soft.     Tenderness: There is no abdominal tenderness. There is no guarding or rebound.     Comments: Mild bilateral flank  tenderness.   Musculoskeletal:        General: Normal range of motion.     Cervical back: Normal range of motion and neck supple.  Skin:    General: Skin is warm and dry.  Neurological:     General: No focal deficit present.     Mental Status: He is alert and oriented to person, place, and time.     (all labs ordered are listed, but only abnormal results are displayed) Labs Reviewed  BASIC METABOLIC PANEL WITH GFR - Abnormal; Notable for the following components:      Result Value   Glucose, Bld 105 (*)    All other components within normal limits  URINALYSIS, W/ REFLEX TO CULTURE (INFECTION SUSPECTED)  CBC WITH DIFFERENTIAL/PLATELET    EKG: None  Radiology: No results found.   Procedures    Medications Ordered in the ED - No data to display  Clinical Course as of 01/03/24 1256  Mon Jan 03, 2024  1005 Patient to ED with urinary dribbling this morning. He feels less like retention and has more concern for kidney dysfunction. No vomiting. H/O DM.  [SU]  1155 Bedside US  showing >300 cc urine, and given presenting history, this is felt c/w retention. Foley catheter ordered. Patient voiced that he does not want to go home with a catheter in place. Will place catheter for now and quantify urinary output, then have further discussion with the patient.  [SU]  1249 Foley inserted with approximately 400 cc clear urine return. Discussed leaving vs removing foley on discharge. The patient is adamant he cannot do his job with a catheter in place. He has urology follow up scheduled but not until Dec 11. Discussed the likelihood of recurrent/persistent retention and patient acknowledges understanding. Discussed return precautions. Foley removed and patient discharged.  [SU]    Clinical Course User Index [SU] Odell Balls, PA-C                                 Medical Decision Making Amount and/or Complexity of Data Reviewed Labs: ordered.        Final diagnoses:  Urinary retention    ED Discharge Orders          Ordered    tamsulosin  (FLOMAX ) 0.4 MG CAPS capsule  Daily        01/03/24 1253               Odell Balls, PA-C 01/03/24 1256    Jerrol Agent, MD 01/04/24 [phone]

## 2024-01-03 NOTE — Discharge Instructions (Signed)
 As we discussed, there is a high likelihood of recurrent urinary retention. You did not want the catheter left in place on discharge, so if you develop abdominal distention (appears swollen), have difficulty urinating, bloody urine, fever, nausea/vomiting - return to the ED for further evaluation. Call your urologist about moving your appointment up from December visit. I have sent an updated prescription to the pharmacy for Flomax  which may help with urination.

## 2024-01-03 NOTE — Telephone Encounter (Signed)
 Spoke with patient regarding results/recommendations.  Pt going to ED

## 2024-01-03 NOTE — ED Notes (Signed)
 Pt alert and oriented X 4 at the time of discharge. RR even and unlabored. No acute distress noted. Pt verbalized understanding of discharge instructions as discussed. Pt ambulatory to lobby at time of discharge.

## 2024-01-06 ENCOUNTER — Ambulatory Visit
Admission: RE | Admit: 2024-01-06 | Discharge: 2024-01-06 | Disposition: A | Discharge status: Home / Self Care | Source: Ambulatory Visit | Attending: Urology | Admitting: Urology

## 2024-01-06 DIAGNOSIS — R972 Elevated prostate specific antigen [PSA]: Secondary | ICD-10-CM | POA: Diagnosis not present

## 2024-01-06 MED ORDER — GADOPICLENOL 0.5 MMOL/ML IV SOLN
10.0000 mL | Freq: Once | INTRAVENOUS | Status: AC | PRN
  Administered 2024-01-06: 10 mL via INTRAVENOUS

## 2024-01-07 ENCOUNTER — Ambulatory Visit: Admitting: Family Medicine

## 2024-01-07 ENCOUNTER — Encounter: Payer: Self-pay | Admitting: Family Medicine

## 2024-01-07 VITALS — BP 123/78 | HR 68 | Temp 97.9°F | Ht 69.0 in | Wt 218.6 lb

## 2024-01-07 DIAGNOSIS — N400 Enlarged prostate without lower urinary tract symptoms: Secondary | ICD-10-CM

## 2024-01-07 DIAGNOSIS — R338 Other retention of urine: Secondary | ICD-10-CM

## 2024-01-07 DIAGNOSIS — M5442 Lumbago with sciatica, left side: Secondary | ICD-10-CM

## 2024-01-07 DIAGNOSIS — G8929 Other chronic pain: Secondary | ICD-10-CM

## 2024-01-07 DIAGNOSIS — M5441 Lumbago with sciatica, right side: Secondary | ICD-10-CM

## 2024-01-07 DIAGNOSIS — M961 Postlaminectomy syndrome, not elsewhere classified: Secondary | ICD-10-CM | POA: Diagnosis not present

## 2024-01-07 DIAGNOSIS — N398 Other specified disorders of urinary system: Secondary | ICD-10-CM

## 2024-01-07 DIAGNOSIS — R339 Retention of urine, unspecified: Secondary | ICD-10-CM | POA: Diagnosis not present

## 2024-01-07 MED ORDER — PREDNISONE 20 MG PO TABS: ORAL_TABLET | ORAL | 0 refills | Status: DC

## 2024-01-07 MED ORDER — METHYLPREDNISOLONE SODIUM SUCC 125 MG IJ SOLR
80.0000 mg | Freq: Once | INTRAMUSCULAR | Status: AC
  Administered 2024-01-07: 80 mg via INTRAMUSCULAR

## 2024-01-07 NOTE — Progress Notes (Signed)
 OFFICE VISIT  01/07/2024  CC:  Chief Complaint  Patient presents with   Follow-up    ED; pt went on 10/20 for urinary retention    Patient is a 58 y.o. male who presents for emergency department follow-up for urinary retention. [facility] department on 01/03/2024 with difficulty urinating, bedside ultrasound showed significant urinary retention.  Foley was placed and 400 cc clear urine returned.  Urinalysis, CBC, and metabolic panel all normal.   He was adamant that he did not want to go home with a Foley catheter in.  Catheter was removed and he was asked to follow-up here today.  I had seen him a few months ago in the office and started him on Flomax  for lower urinary tract obstructive symptoms at that time.  He also had a mildly elevated PSA.  I referred him to urology at that time.  INTERIM HX:  Urol  ->Silodosin x the last 3 d started helping-->emptied bladder today completely, bladder scan confirmed at [facility] today. MRI prostate was done yesterday for further eval of his inc PSA, results pending.  Back pain still severe, intermittently down both legs, also a burning/numbness sensation around anal region, similar to what he felt prior to requiring surgery 2014. Back surgeon did MRI 11/25/23, fairly stable compared to last year.  Past Medical History:  Diagnosis Date   Achilles tendon disorder    bilat calcific tendonopathy (shockwave therapy, Dr. Chick)   Chronic low back pain    estab emergeortho 11/2020->DDD/postlaminectomy syndrome   Depression    Diabetes (HCC)    GERD (gastroesophageal reflux disease)    History of adenomatous polyp of colon 2019   Recall 2024   Hyperlipidemia    rec'd statin 04/2020   Hypervitaminosis D 05/03/2020   oversupplementation (10K U tab qd for a couple yrs)   IBS (irritable bowel syndrome)    Insomnia    Migraine syndrome    Dr. Ines    Past Surgical History:  Procedure Laterality Date    COLONOSCOPY N/A 03/07/2018   2019 Adenoma x 1---recall 2024. Procedure: COLONOSCOPY;  Surgeon: Harvey Margo CROME, MD;  Location: [facility];  Service: Endoscopy;  Laterality: N/A;  10:30   coronary calcium  score     09/2022 ZERO   Coronary calcium  score     09/2022 ZERO   ESOPHAGOGASTRODUODENOSCOPY  2005   EYE SURGERY  2000   Lasik   LUMBAR EPIDURAL INJECTION  2014   L4-5   LUMBAR LAMINECTOMY  2014   MOUTH SURGERY  2003   POLYPECTOMY  03/07/2018   Procedure: POLYPECTOMY;  Surgeon: Harvey Margo CROME, MD;  Location: [facility];  Service: Endoscopy;;  sigmoid   SPINE SURGERY  2001   2 micrdiskectomies, 1 lamenectomy    Outpatient Medications Prior to Visit  Medication Sig Dispense Refill   BOTOX  100 units SOLR injection PROVIDER TO INJECT 155 UNITS INTO THE MUSCLES OF THE HEAD AND NECK EVERY 3 MONTHS. DISCARD REMAINDER. 200 Units 1   co-enzyme Q-10 50 MG capsule Take 200 mg by mouth daily.     Continuous Glucose Sensor (FREESTYLE LIBRE 2 SENSOR) MISC 1 EACH BY DOES NOT APPLY ROUTE EVERY 14 (FOURTEEN) DAYS. 6 each 0   Cyanocobalamin  (VITAMIN B-12 PO) Take by mouth.     cyclobenzaprine  (FLEXERIL ) 10 MG tablet Take 1 tablet (10 mg total) by mouth 3 (three) times daily as needed for muscle spasms. 90 tablet 1   glucose blood (FREESTYLE  PRECISION NEO TEST) test strip Use to check sugar 1-2 times daily. 100 each 12   Magnesium 500 MG TABS Take 500 mg by mouth daily.     Multiple Vitamins-Minerals (ZINC PO) Take 12 mg by mouth.     naratriptan  (AMERGE) 2.5 MG tablet Take one (1) tablet at onset of headache; if returns or does not resolve, may repeat after 4 hours; do not exceed five (5) mg in 24 hours. 9 tablet 1   prochlorperazine  (COMPAZINE ) 10 MG tablet Take 1 tablet (10 mg total) by mouth every 6 (six) hours as needed for nausea or vomiting. 30 tablet 0   tamsulosin  (FLOMAX ) 0.4 MG CAPS capsule Take 1 capsule (0.4 mg total) by mouth daily. 30 capsule 0   VITAMIN D  PO Take 10,000 Int'l Units  by mouth. (Patient taking differently: Take 5,000 Int'l Units by mouth.)     VITAMIN K PO Take 200 mg by mouth.     No facility-administered medications prior to visit.    Allergies  Allergen Reactions   Amoxicillin    Penicillins    Pseudoephedrine Palpitations   Sulfa Antibiotics Rash   Wellbutrin [Bupropion] Nausea Only, Anxiety and Other (See Comments)    Insomnia and paranoia    Review of Systems As per HPI  PE:    01/07/2024   10:03 AM 01/03/2024    1:15 PM 01/03/2024    1:00 PM  Vitals with BMI  Height 5' 9    Weight 218 lbs 10 oz    BMI 32.27    Systolic 123  127  Diastolic 78  81  Pulse 68 80 78     Physical Exam  Gen: Alert, well appearing.  Patient is oriented to person, place, time, and situation. Bilat LE strength 5/5 prox/dist, patellar DTRs 1-2+ bilaterally, Achilles DTR 1+ on the right, absent on the left.  LABS:  Last metabolic panel Lab Results  Component Value Date   GLUCOSE 105 (H) 01/03/2024   NA 140 01/03/2024   K 4.1 01/03/2024   CL 105 01/03/2024   CO2 24 01/03/2024   BUN 19 01/03/2024   CREATININE 1.04 01/03/2024   GFRNONAA >60 01/03/2024   CALCIUM  9.7 01/03/2024   PROT 7.1 10/05/2023   ALBUMIN 4.6 10/05/2023   BILITOT 0.8 10/05/2023   ALKPHOS 61 10/05/2023   AST 20 10/05/2023   ALT 21 10/05/2023   ANIONGAP 11 01/03/2024   Lab Results  Component Value Date   PSA 4.35 (H) 10/05/2023   PSA 2.36 09/29/2021   PSA 1.98 05/02/2020   IMPRESSION AND PLAN:  #1 BPH, with recent episode of acute urinary retention. He is better on siladosin the last few days. Has f/u set with urol soon, bladder emptying scan planned.  #2 Elev PSA, urol did MRI prost, results pending, urol f/u set up.  #3 Progressive chronic back pain, postlaminectomy syndrome. Pt states he has gotten some temporary relief in past episodes like this from steroids. I gave 80mg  depomedrol here today and will start him on prednisone  taper tomorrow (40 x 3, 20 x 3,  then 10 x 4). He is in the process of arranging a f/u appt with his back MD, Dr. Marlyce.  An After Visit Summary was printed and given to the patient.  FOLLOW UP: Keep appt already set for 10/29  Signed:  Gerlene Hockey, MD           01/07/2024

## 2024-01-07 NOTE — Patient Instructions (Signed)
Start prednisone tomorrow

## 2024-01-10 DIAGNOSIS — N401 Enlarged prostate with lower urinary tract symptoms: Secondary | ICD-10-CM | POA: Diagnosis not present

## 2024-01-10 DIAGNOSIS — R3914 Feeling of incomplete bladder emptying: Secondary | ICD-10-CM | POA: Diagnosis not present

## 2024-01-10 DIAGNOSIS — R972 Elevated prostate specific antigen [PSA]: Secondary | ICD-10-CM | POA: Diagnosis not present

## 2024-01-12 ENCOUNTER — Telehealth: Payer: Self-pay

## 2024-01-12 ENCOUNTER — Encounter: Payer: Self-pay | Admitting: Family Medicine

## 2024-01-12 ENCOUNTER — Ambulatory Visit: Admitting: Family Medicine

## 2024-01-12 VITALS — BP 118/70 | HR 64 | Temp 97.1°F | Ht 69.0 in | Wt 218.8 lb

## 2024-01-12 DIAGNOSIS — E119 Type 2 diabetes mellitus without complications: Secondary | ICD-10-CM

## 2024-01-12 DIAGNOSIS — M5441 Lumbago with sciatica, right side: Secondary | ICD-10-CM

## 2024-01-12 DIAGNOSIS — G8929 Other chronic pain: Secondary | ICD-10-CM

## 2024-01-12 DIAGNOSIS — G834 Cauda equina syndrome: Secondary | ICD-10-CM

## 2024-01-12 DIAGNOSIS — E7800 Pure hypercholesterolemia, unspecified: Secondary | ICD-10-CM | POA: Diagnosis not present

## 2024-01-12 DIAGNOSIS — M5442 Lumbago with sciatica, left side: Secondary | ICD-10-CM

## 2024-01-12 DIAGNOSIS — M48062 Spinal stenosis, lumbar region with neurogenic claudication: Secondary | ICD-10-CM

## 2024-01-12 LAB — POCT GLYCOSYLATED HEMOGLOBIN (HGB A1C)
HbA1c POC (<> result, manual entry): 5.1 % (ref 4.0–5.6)
HbA1c, POC (controlled diabetic range): 5.1 % (ref 0.0–7.0)
HbA1c, POC (prediabetic range): 5.1 % — AB (ref 5.7–6.4)
Hemoglobin A1C: 5.1 % (ref 4.0–5.6)

## 2024-01-12 NOTE — Telephone Encounter (Signed)
-----   Message from Aleene VEAR Hockey sent at 01/12/2024 10:20 AM EDT ----- Pls call him and notify him that his MRI could not be approved. I recommend he go to the emergency department (any one he chooses is fine) and tell them he has saddle anesthesia and recent urinary retention, and with his hx of severe back problems I'm concerned about cauda equina syndrome.  They'll evaluate him and do MRI there. They'll be able to see my notes. -thx

## 2024-01-12 NOTE — Progress Notes (Signed)
 OFFICE VISIT  01/12/2024  CC:  Chief Complaint  Patient presents with   Medical Management of Chronic Issues    Pt is fasting    Patient is a 58 y.o. male who presents for 11-month follow-up diabetes and hypercholesterolemia. A/P as of last visit: 1 diabetes without complication. Excellent control with diet and exercise. Hemoglobin A1c 5.2.   #2 Hyperlipidemia.  Patient wants to continue to work on low-fat/low-cholesterol diet and increasing exercise and losing more weight. If this does not help in the next 6 months he is open to starting statin medication.   #3  Lower urinary tract obstructive symptoms.  Suspect BPH.  Low suspicion acute prostatitis. Trial of Flomax  0.4 mg daily.   #4 Elevated PSA, 4.35.  Refer to urology today.   #5 Preventative health: Vaccines: Shingrix-->deferred.  Prevnar->deferred.  Otherwise UTD. Prostate ca screening: PSA 10/05/23 slightly elevated at 4.35--> urology w/u in process. Colon ca screening: History of polyps 2019.  Due for recall as of 2024.  He is no longer near his GI MD/office.  New referral closer to home ordered today.  INTERIM HX: He is established with Dr. Watt in urology.  MRI of prostate was done for further evaluation of his mild elevation of PSA. He did not tolerate Flomax .  He is currently on some Rapaflo and it is helping flow.  LBP still severe but possibly a little bit better since I started him on steroids 5 days ago--> still radiates down both legs. He is better able to tell that he does have saddle anesthesia, says he feels pretty sure that he has this and it is new since his last evaluation by his back doctor and last MRI (11/25/2023).  Additionally, he had an episode of acute urinary retention 9 days ago. He is doing better on Rapaflo per urologist. No problems with fecal retention or control of defecation. No focal leg weakness.  Home glucoses average 90-120 in the fasting state. A little bit higher the last couple of  weeks--> sometimes in the 140s.  He has improved his diet significantly in the last few weeks.     Past Medical History:  Diagnosis Date   Achilles tendon disorder    bilat calcific tendonopathy (shockwave therapy, Dr. Chick)   Chronic low back pain    estab emergeortho 11/2020->DDD/postlaminectomy syndrome   Depression    Diabetes (HCC)    GERD (gastroesophageal reflux disease)    History of adenomatous polyp of colon 2019   Recall 2024   Hyperlipidemia    rec'd statin 04/2020   Hypervitaminosis D 05/03/2020   oversupplementation (10K U tab qd for a couple yrs)   IBS (irritable bowel syndrome)    Insomnia    Migraine syndrome    Dr. Ines    Past Surgical History:  Procedure Laterality Date   COLONOSCOPY N/A 03/07/2018   2019 Adenoma x 1---recall 2024. Procedure: COLONOSCOPY;  Surgeon: Harvey Margo CROME, MD;  Location: [facility];  Service: Endoscopy;  Laterality: N/A;  10:30   coronary calcium  score     09/2022 ZERO   Coronary calcium  score     09/2022 ZERO   ESOPHAGOGASTRODUODENOSCOPY  2005   EYE SURGERY  2000   Lasik   LUMBAR EPIDURAL INJECTION  2014   L4-5   LUMBAR LAMINECTOMY  2014   MOUTH SURGERY  2003   POLYPECTOMY  03/07/2018   Procedure: POLYPECTOMY;  Surgeon: Harvey Margo CROME, MD;  Location: [facility];  Service: Endoscopy;;  sigmoid  SPINE SURGERY  2001   2 micrdiskectomies, 1 lamenectomy    Outpatient Medications Prior to Visit  Medication Sig Dispense Refill   BOTOX  100 units SOLR injection PROVIDER TO INJECT 155 UNITS INTO THE MUSCLES OF THE HEAD AND NECK EVERY 3 MONTHS. DISCARD REMAINDER. 200 Units 1   co-enzyme Q-10 50 MG capsule Take 200 mg by mouth daily.     Continuous Glucose Sensor (FREESTYLE LIBRE 2 SENSOR) MISC 1 EACH BY DOES NOT APPLY ROUTE EVERY 14 (FOURTEEN) DAYS. 6 each 0   Cyanocobalamin  (VITAMIN B-12 PO) Take by mouth.     cyclobenzaprine  (FLEXERIL ) 10 MG tablet Take 1 tablet (10 mg total) by mouth 3 (three) times daily as needed  for muscle spasms. 90 tablet 1   glucose blood (FREESTYLE PRECISION NEO TEST) test strip Use to check sugar 1-2 times daily. 100 each 12   Magnesium 500 MG TABS Take 500 mg by mouth daily.     Multiple Vitamins-Minerals (ZINC PO) Take 12 mg by mouth.     naratriptan  (AMERGE) 2.5 MG tablet Take one (1) tablet at onset of headache; if returns or does not resolve, may repeat after 4 hours; do not exceed five (5) mg in 24 hours. 9 tablet 1   predniSONE  (DELTASONE ) 20 MG tablet 2 tabs po every day x 3d then 1 tab po every day x 3d then 1/2 tab po every day x 4d then stop 11 tablet 0   prochlorperazine  (COMPAZINE ) 10 MG tablet Take 1 tablet (10 mg total) by mouth every 6 (six) hours as needed for nausea or vomiting. 30 tablet 0   silodosin (RAPAFLO) 4 MG CAPS capsule Take 4 mg by mouth daily.     VITAMIN D  PO Take 10,000 Int'l Units by mouth. (Patient taking differently: Take 5,000 Int'l Units by mouth.)     VITAMIN K PO Take 200 mg by mouth.     tamsulosin  (FLOMAX ) 0.4 MG CAPS capsule Take 1 capsule (0.4 mg total) by mouth daily. 30 capsule 0   No facility-administered medications prior to visit.    Allergies  Allergen Reactions   Amoxicillin    Penicillins    Pseudoephedrine Palpitations   Sulfa Antibiotics Rash   Wellbutrin [Bupropion] Nausea Only, Anxiety and Other (See Comments)    Insomnia and paranoia    Review of Systems As per HPI  PE:    01/12/2024    8:01 AM 01/07/2024   10:03 AM 01/03/2024    1:15 PM  Vitals with BMI  Height 5' 9 5' 9   Weight 218 lbs 13 oz 218 lbs 10 oz   BMI 32.3 32.27   Systolic 118 123   Diastolic 70 78   Pulse 64 68 80     Physical Exam  Gen: Alert, well appearing.  Patient is oriented to person, place, time, and situation. AFFECT: pleasant, lucid thought and speech. No tenderness in the low back.  He has 5 out of 5 strength proximally and distally in both lower extremities.  He has 1+ Achilles DTR on the right, no Achilles DTR on the  left.  Patellar DTR 2+ bilateral. Rectal tone is normal.  LABS:  Last CBC Lab Results  Component Value Date   WBC 5.2 01/03/2024   HGB 14.9 01/03/2024   HCT 43.0 01/03/2024   MCV 86.0 01/03/2024   MCH 29.8 01/03/2024   RDW 13.2 01/03/2024   PLT 197 01/03/2024   Last metabolic panel Lab Results  Component Value Date  GLUCOSE 105 (H) 01/03/2024   NA 140 01/03/2024   K 4.1 01/03/2024   CL 105 01/03/2024   CO2 24 01/03/2024   BUN 19 01/03/2024   CREATININE 1.04 01/03/2024   GFRNONAA >60 01/03/2024   CALCIUM  9.7 01/03/2024   PROT 7.1 10/05/2023   ALBUMIN 4.6 10/05/2023   BILITOT 0.8 10/05/2023   ALKPHOS 61 10/05/2023   AST 20 10/05/2023   ALT 21 10/05/2023   ANIONGAP 11 01/03/2024   Last lipids Lab Results  Component Value Date   CHOL 256 (H) 10/05/2023   HDL 60.20 10/05/2023   LDLCALC 179 (H) 10/05/2023   LDLDIRECT 158.0 09/04/2022   TRIG 86.0 10/05/2023   CHOLHDL 4 10/05/2023   Last hemoglobin A1c Lab Results  Component Value Date   HGBA1C 5.1 01/12/2024   HGBA1C 5.1 01/12/2024   HGBA1C 5.1 (A) 01/12/2024   HGBA1C 5.1 01/12/2024   IMPRESSION AND PLAN:  #1 diabetes without complication, excellent control with diet POC Hba1c today is 5.1%.  2 Hyperlipidemia.  Working on diet a lot lately. If this does not help in the next 3 months he is open to starting statin medication.  #3 acute worsening of chronic low back pain. He has 2 clinical features consistent with cauda equina syndrome (recent acute urinary retention and currently with saddle anesthesia.  No focal leg weakness or decrease in anal sphincter tone. Since his changes have occurred since his last MRI and back surgeon evaluation on 11/25/2023 we will proceed with urgent MRI of the lumbar spine.  #4 elevated PSA, recent prostate MRI 01/06/24 showed a lesion suspicious for high grade cancer.  Further workup/biopsy pending.  An After Visit Summary was printed and given to the patient.  FOLLOW UP: TBD  based on results today  Signed:  Gerlene Hockey, MD           01/12/2024

## 2024-01-12 NOTE — Telephone Encounter (Signed)
 LVM for pt to return call regarding provider recommendations. Pt was sent message via MyChart as well.

## 2024-01-12 NOTE — Telephone Encounter (Signed)
 Source  Carlos Ford (Patient)   Subject  Carlos Ford (Patient)   Topic  General - Other    Communication  Reason for CRM: patient returned call and I let him know that a mychart message was sent to him. I read the message and he stated that makes a lot of sense     Patient Information  Patient Name Gender DOB SSN  Nicolaas, Savo Male 17-Jul-1965 [id number]   Resolution  Resolved On Reason By Resolved First Attempt?  01/12/2024 11:19 AM Information Only Letha Knee D Yes   Contacts  Contact Date/Time Type Contact Phone/Fax  01/12/2024 11:11 AM EDT Phone (Incoming) Orlen, Leedy (Self) [phone]   Provider verbally made aware

## 2024-01-13 ENCOUNTER — Ambulatory Visit: Admitting: Family Medicine

## 2024-01-13 ENCOUNTER — Telehealth: Payer: Self-pay | Admitting: Family Medicine

## 2024-01-13 ENCOUNTER — Ambulatory Visit
Admission: RE | Admit: 2024-01-13 | Discharge: 2024-01-13 | Disposition: A | Discharge status: Home / Self Care | Source: Ambulatory Visit | Attending: Family Medicine | Admitting: Family Medicine

## 2024-01-13 ENCOUNTER — Encounter: Payer: Self-pay | Admitting: Family Medicine

## 2024-01-13 VITALS — BP 115/85 | HR 77 | Ht 69.0 in | Wt 214.2 lb

## 2024-01-13 DIAGNOSIS — G43709 Chronic migraine without aura, not intractable, without status migrainosus: Secondary | ICD-10-CM

## 2024-01-13 DIAGNOSIS — G8929 Other chronic pain: Secondary | ICD-10-CM

## 2024-01-13 DIAGNOSIS — M545 Low back pain, unspecified: Secondary | ICD-10-CM | POA: Diagnosis not present

## 2024-01-13 DIAGNOSIS — M48062 Spinal stenosis, lumbar region with neurogenic claudication: Secondary | ICD-10-CM

## 2024-01-13 DIAGNOSIS — G834 Cauda equina syndrome: Secondary | ICD-10-CM

## 2024-01-13 DIAGNOSIS — M47817 Spondylosis without myelopathy or radiculopathy, lumbosacral region: Secondary | ICD-10-CM | POA: Diagnosis not present

## 2024-01-13 DIAGNOSIS — M5127 Other intervertebral disc displacement, lumbosacral region: Secondary | ICD-10-CM | POA: Diagnosis not present

## 2024-01-13 MED ORDER — NARATRIPTAN HCL 2.5 MG PO TABS: ORAL_TABLET | ORAL | 11 refills | Status: DC

## 2024-01-13 NOTE — Patient Instructions (Signed)
 Below is our plan:  We will continue Botox  every 12 weeks. Continue naratriptan  as needed.   Please make sure you are staying well hydrated. I recommend 50-60 ounces daily. Well balanced diet and regular exercise encouraged. Consistent sleep schedule with 6-8 hours recommended.   Please continue follow up with care team as directed.   Follow up with me in 1 year   You may receive a survey regarding today's visit. I encourage you to leave honest feed back as I do use this information to improve patient care. Thank you for seeing me today!   GENERAL HEADACHE INFORMATION:   Natural supplements: Magnesium Oxide or Magnesium Glycinate 500 mg at bed (up to 800 mg daily) Coenzyme Q10 300 mg in AM Vitamin B2- 200 mg twice a day   Add 1 supplement at a time since even natural supplements can have undesirable side effects. You can sometimes buy supplements cheaper (especially Coenzyme Q10) at www.webmailguide.co.za or at [facility].  Migraine with aura: There is increased risk for stroke in women with migraine with aura and a contraindication for the combined contraceptive pill for use by women who have migraine with aura. The risk for women with migraine without aura is lower. However other risk factors like smoking are far more likely to increase stroke risk than migraine. There is a recommendation for no smoking and for the use of OCPs without estrogen such as progestogen only pills particularly for women with migraine with aura.SABRA People who have migraine headaches with auras may be 3 times more likely to have a stroke caused by a blood clot, compared to migraine patients who don't see auras. Women who take hormone-replacement therapy may be 30 percent more likely to suffer a clot-based stroke than women not taking medication containing estrogen. Other risk factors like smoking and high blood pressure may be  much more important.    Vitamins and herbs that show potential:   Magnesium: Magnesium (250 mg twice  a day or 500 mg at bed) has a relaxant effect on smooth muscles such as blood vessels. Individuals suffering from frequent or daily headache usually have low magnesium levels which can be increase with daily supplementation of 400-750 mg. Three trials found 40-90% average headache reduction  when used as a preventative. Magnesium may help with headaches are aura, the best evidence for magnesium is for migraine with aura is its thought to stop the cortical spreading depression we believe is the pathophysiology of migraine aura.Magnesium also demonstrated the benefit in menstrually related migraine.  Magnesium is part of the messenger system in the serotonin cascade and it is a good muscle relaxant.  It is also useful for constipation which can be a side effect of other medications used to treat migraine. Good sources include nuts, whole grains, and tomatoes. Side Effects: loose stool/diarrhea  Riboflavin (vitamin B 2) 200 mg twice a day. This vitamin assists nerve cells in the production of ATP a principal energy storing molecule.  It is necessary for many chemical reactions in the body.  There have been at least 3 clinical trials of riboflavin using 400 mg per day all of which suggested that migraine frequency can be decreased.  All 3 trials showed significant improvement in over half of migraine sufferers.  The supplement is found in bread, cereal, milk, meat, and poultry.  Most Americans get more riboflavin than the recommended daily allowance, however riboflavin deficiency is not necessary for the supplements to help prevent headache. Side effects: energizing, green urine  Coenzyme Q10: This is present in almost all cells in the body and is critical component for the conversion of energy.  Recent studies have shown that a nutritional supplement of CoQ10 can reduce the frequency of migraine attacks by improving the energy production of cells as with riboflavin.  Doses of 150 mg twice a day have been shown to  be effective.   Melatonin: Increasing evidence shows correlation between melatonin secretion and headache conditions.  Melatonin supplementation has decreased headache intensity and duration.  It is widely used as a sleep aid.  Sleep is natures way of dealing with migraine.  A dose of 3 mg is recommended to start for headaches including cluster headache. Higher doses up to 15 mg has been reviewed for use in Cluster headache and have been used. The rationale behind using melatonin for cluster is that many theories regarding the cause of Cluster headache center around the disruption of the normal circadian rhythm in the brain.  This helps restore the normal circadian rhythm.   HEADACHE DIET: Foods and beverages which may trigger migraine Note that only 20% of headache patients are food sensitive. You will know if you are food sensitive if you get a headache consistently 20 minutes to 2 hours after eating a certain food. Only cut out a food if it causes headaches, otherwise you might remove foods you enjoy! What matters most for diet is to eat a well balanced healthy diet full of vegetables and low fat protein, and to not miss meals.   Chocolate, other sweets ALL cheeses except cottage and cream cheese Dairy products, yogurt, sour cream, ice cream Liver Meat extracts (Bovril, Marmite, meat tenderizers) Meats or fish which have undergone aging, fermenting, pickling or smoking. These include: Hotdogs,salami,Lox,sausage, mortadellas,smoked salmon, pepperoni, Pickled herring Pods of broad bean (English beans, Chinese pea pods, Italian (fava) beans, lima and navy beans Ripe avocado, ripe banana Yeast extracts or active yeast preparations such as Brewer's or Fleishman's (commercial bakes goods are permitted) Tomato based foods, pizza (lasagna, etc.)   MSG (monosodium glutamate) is disguised as many things; look for these common aliases: Monopotassium glutamate Autolysed yeast Hydrolysed protein Sodium  caseinate "flavorings" "all natural preservatives Nutrasweet   Avoid all other foods that convincingly provoke headaches.   Resources: The Dizzy Bluford Aid Your Headache Diet, migrainestrong.com  https://zamora-andrews.com/   Caffeine and Migraine For patients that have migraine, caffeine intake more than 3 days per week can lead to dependency and increased migraine frequency. I would recommend cutting back on your caffeine intake as best you can. The recommended amount of caffeine is 200-300 mg daily, although migraine patients may experience dependency at even lower doses. While you may notice an increase in headache temporarily, cutting back will be helpful for headaches in the long run. For more information on caffeine and migraine, visit: https://americanmigrainefoundation.org/resource-library/caffeine-and-migraine/   Headache Prevention Strategies:   1. Maintain a headache diary; learn to identify and avoid triggers.  - This can be a simple note where you log when you had a headache, associated symptoms, and medications used - There are several smartphone apps developed to help track migraines: Migraine Buddy, Migraine Monitor, Curelator N1-Headache App   Common triggers include: Emotional triggers: Emotional/Upset family or friends Emotional/Upset occupation Business reversal/success Anticipation anxiety Crisis-serious Post-crisis periodNew job/position   Physical triggers: Vacation Day Weekend Strenuous Exercise High Altitude Location New Move Menstrual Day Physical Illness Oversleep/Not enough sleep Weather changes Light: Photophobia or light sesnitivity treatment involves a balance between desensitization and reduction  in overly strong input. Use dark polarized glasses outside, but not inside. Avoid bright or fluorescent light, but do not dim environment to the point that going into a normally lit room hurts. Consider  FL-41 tint lenses, which reduce the most irritating wavelengths without blocking too much light.  These can be obtained at axonoptics.com or theraspecs.com Foods: see list above.   2. Limit use of acute treatments (over-the-counter medications, triptans, etc.) to no more than 2 days per week or 10 days per month to prevent medication overuse headache (rebound headache).     3. Follow a regular schedule (including weekends and holidays): Don't skip meals. Eat a balanced diet. 8 hours of sleep nightly. Minimize stress. Exercise 30 minutes per day. Being overweight is associated with a 5 times increased risk of chronic migraine. Keep well hydrated and drink 6-8 glasses of water  per day.   4. Initiate non-pharmacologic measures at the earliest onset of your headache. Rest and quiet environment. Relax and reduce stress. Breathe2Relax is a free app that can instruct you on    some simple relaxtion and breathing techniques. Http://Dawnbuse.com is a    free website that provides teaching videos on relaxation.  Also, there are  many apps that   can be downloaded for "mindful" relaxation.  An app called YOGA NIDRA will help walk you through mindfulness. Another app called Calm can be downloaded to give you a structured mindfulness guide with daily reminders and skill development. Headspace for guided meditation Mindfulness Based Stress Reduction Online Course: www.palousemindfulness.com Cold compresses.   5. Don't wait!! Take the maximum allowable dosage of prescribed medication at the first sign of migraine.   6. Compliance:  Take prescribed medication regularly as directed and at the first sign of a migraine.   7. Communicate:  Call your physician when problems arise, especially if your headaches change, increase in frequency/severity, or become associated with neurological symptoms (weakness, numbness, slurred speech, etc.). Proceed to emergency room if you experience new or worsening symptoms or  symptoms do not resolve, if you have new neurologic symptoms or if headache is severe, or for any concerning symptom.   8. Headache/pain management therapies: Consider various complementary methods, including medication, behavioral therapy, psychological counselling, biofeedback, massage therapy, acupuncture, dry needling, and other modalities.  Such measures may reduce the need for medications. Counseling for pain management, where patients learn to function and ignore/minimize their pain, seems to work very well.   9. Recommend changing family's attention and focus away from patient's headaches. Instead, emphasize daily activities. If first question of day is 'How are your headaches/Do you have a headache today?', then patient will constantly think about headaches, thus making them worse. Goal is to re-direct attention away from headaches, toward daily activities and other distractions.   10. Helpful Websites: www.AmericanHeadacheSociety.org patenthood.ch www.headaches.org tightmarket.nl www.achenet.org

## 2024-01-13 NOTE — Telephone Encounter (Signed)
 Patient said need an appointment before having surgery due to cannot be recovering from Botox  while recovering from surgery, having on December 8. Want to schedule office visit to discuss alterative to Botox .

## 2024-01-13 NOTE — Progress Notes (Signed)
 Chief Complaint  Patient presents with   Follow-up    Pt in room 1. Alone. Here to discuss other options besides botox .     HISTORY OF PRESENT ILLNESS:  01/13/24 ALL:  Carlos Ford is a 58 y.o. male here today for follow up for migraines. He has done very well on Botox  for the past 5 years. He reports having 6-8 milder headache days over the 12 week cycle. Usually only 2 migrainous headaches. Naratriptan  works well. He is having a prostate biopsy soon. He is having lumbar fusion 02/21/2024. He is feeing well and without concerns, today.    REVIEW OF SYSTEMS: Out of a complete 14 system review of symptoms, the patient complains only of the following symptoms, headaches, back pain, and all other reviewed systems are negative.   ALLERGIES: Allergies  Allergen Reactions   Amoxicillin    Penicillins    Pseudoephedrine Palpitations   Sulfa Antibiotics Rash   Wellbutrin [Bupropion] Nausea Only, Anxiety and Other (See Comments)    Insomnia and paranoia     HOME MEDICATIONS: Outpatient Medications Prior to Visit  Medication Sig Dispense Refill   BOTOX  100 units SOLR injection PROVIDER TO INJECT 155 UNITS INTO THE MUSCLES OF THE HEAD AND NECK EVERY 3 MONTHS. DISCARD REMAINDER. 200 Units 1   co-enzyme Q-10 50 MG capsule Take 200 mg by mouth daily.     Continuous Glucose Sensor (FREESTYLE LIBRE 2 SENSOR) MISC 1 EACH BY DOES NOT APPLY ROUTE EVERY 14 (FOURTEEN) DAYS. 6 each 0   Cyanocobalamin  (VITAMIN B-12 PO) Take by mouth.     cyclobenzaprine  (FLEXERIL ) 10 MG tablet Take 1 tablet (10 mg total) by mouth 3 (three) times daily as needed for muscle spasms. 90 tablet 1   glucose blood (FREESTYLE PRECISION NEO TEST) test strip Use to check sugar 1-2 times daily. 100 each 12   Magnesium 500 MG TABS Take 500 mg by mouth daily.     Multiple Vitamins-Minerals (ZINC PO) Take 12 mg by mouth.     predniSONE  (DELTASONE ) 20 MG tablet 2 tabs po every day x 3d then 1 tab po every day x 3d then 1/2  tab po every day x 4d then stop 11 tablet 0   prochlorperazine  (COMPAZINE ) 10 MG tablet Take 1 tablet (10 mg total) by mouth every 6 (six) hours as needed for nausea or vomiting. 30 tablet 0   silodosin (RAPAFLO) 4 MG CAPS capsule Take 4 mg by mouth daily.     VITAMIN D  PO Take 10,000 Int'l Units by mouth. (Patient taking differently: Take 5,000 Int'l Units by mouth.)     VITAMIN K PO Take 200 mg by mouth.     naratriptan  (AMERGE) 2.5 MG tablet Take one (1) tablet at onset of headache; if returns or does not resolve, may repeat after 4 hours; do not exceed five (5) mg in 24 hours. 9 tablet 1   No facility-administered medications prior to visit.     PAST MEDICAL HISTORY: Past Medical History:  Diagnosis Date   Achilles tendon disorder    bilat calcific tendonopathy (shockwave therapy, Dr. Chick)   Chronic low back pain    estab emergeortho 11/2020->DDD/postlaminectomy syndrome   Depression    Diabetes (HCC)    GERD (gastroesophageal reflux disease)    History of adenomatous polyp of colon 2019   Recall 2024   Hyperlipidemia    rec'd statin 04/2020   Hypervitaminosis D 05/03/2020   oversupplementation (10K U tab qd for a  couple yrs)   IBS (irritable bowel syndrome)    Insomnia    Migraine syndrome    Dr. Ines     PAST SURGICAL HISTORY: Past Surgical History:  Procedure Laterality Date   COLONOSCOPY N/A 03/07/2018   2019 Adenoma x 1---recall 2024. Procedure: COLONOSCOPY;  Surgeon: Harvey Margo CROME, MD;  Location: [facility];  Service: Endoscopy;  Laterality: N/A;  10:30   coronary calcium  score     09/2022 ZERO   Coronary calcium  score     09/2022 ZERO   ESOPHAGOGASTRODUODENOSCOPY  2005   EYE SURGERY  2000   Lasik   LUMBAR EPIDURAL INJECTION  2014   L4-5   LUMBAR LAMINECTOMY  2014   MOUTH SURGERY  2003   POLYPECTOMY  03/07/2018   Procedure: POLYPECTOMY;  Surgeon: Harvey Margo CROME, MD;  Location: [facility];  Service: Endoscopy;;  sigmoid   SPINE SURGERY  2001   2  micrdiskectomies, 1 lamenectomy     FAMILY HISTORY: Family History  Problem Relation Age of Onset   Migraines Mother    Arthritis Mother    Diabetes Father    CAD Father    Heart attack Father    Migraines Sister    Migraines Sister    Diabetes Daughter    Cancer Maternal Grandmother    Early death Maternal Grandfather    Colon cancer Neg Hx    Colon polyps Neg Hx      SOCIAL HISTORY: Social History   Socioeconomic History   Marital status: Married    Spouse name: Not on file   Number of children: Not on file   Years of education: Not on file   Highest education level: Bachelor's degree (e.g., BA, AB, BS)  Occupational History   Occupation: administrator, arts  Tobacco Use   Smoking status: Never   Smokeless tobacco: Former  Building Services Engineer status: Never Used  Substance and Sexual Activity   Alcohol use: Not Currently    Comment: occasional   Drug use: No   Sexual activity: Yes  Other Topics Concern   Not on file  Social History Narrative   Married, 3 daughters.     Orig from [place] , relocated to [facility].   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   No alc.   No drugs.   Hobby: fishing   Social Drivers of Corporate Investment Banker Strain: Low Risk  (09/23/2023)   Received from [facility]   Overall Financial Resource Strain (CARDIA)    Difficulty of Paying Living Expenses: Not hard at all  Food Insecurity: No Food Insecurity (09/23/2023)   Received from [facility]   Hunger Vital Sign    Within the past 12 months, you worried that your food would run out before you got the money to buy more.: Never true    Within the past 12 months, the food you bought just didn't last and you didn't have money to get more.: Never true  Transportation Needs: No Transportation Needs (09/23/2023)   Received from [facility] - Transportation    Lack of Transportation (Medical): No    Lack of Transportation (Non-Medical): No   Physical Activity: Insufficiently Active (12/07/2022)   Exercise Vital Sign    Days of Exercise per Week: 4 days    Minutes of Exercise per Session: 30 min  Stress: Not on file  Social Connections: Moderately Integrated (12/07/2022)   Social Connection and Isolation Panel  Frequency of Communication with Friends and Family: Once a week    Frequency of Social Gatherings with Friends and Family: Never    Attends Religious Services: 1 to 4 times per year    Active Member of [employer] or Organizations: Yes    Attends Banker Meetings: 1 to 4 times per year    Marital Status: Married  Catering Manager Violence: Not on file     PHYSICAL EXAM  Vitals:   01/13/24 1014  BP: 115/85  Pulse: 77  SpO2: 98%  Weight: 214 lb 3.2 oz (97.2 kg)  Height: 5' 9 (1.753 m)   Body mass index is 31.63 kg/m.  Generalized: Well developed, in no acute distress  Cardiology: normal rate and rhythm, no murmur auscultated  Respiratory: clear to auscultation bilaterally    Neurological examination  Mentation: Alert oriented to time, place, history taking. Follows all commands speech and language fluent Cranial nerve II-XII: Pupils were equal round reactive to light. Extraocular movements were full, visual field were full on confrontational test. Facial sensation and strength were normal. Uvula tongue midline. Head turning and shoulder shrug  were normal and symmetric. Motor: The motor testing reveals 5 over 5 strength of all 4 extremities. Good symmetric motor tone is noted throughout.   Gait and station: Gait is normal.    DIAGNOSTIC DATA (LABS, IMAGING, TESTING) - I reviewed patient records, labs, notes, testing and imaging myself where available.  Lab Results  Component Value Date   WBC 5.2 01/03/2024   HGB 14.9 01/03/2024   HCT 43.0 01/03/2024   MCV 86.0 01/03/2024   PLT 197 01/03/2024      Component Value Date/Time   NA 140 01/03/2024 1003   K 4.1 01/03/2024 1003   CL 105  01/03/2024 1003   CO2 24 01/03/2024 1003   GLUCOSE 105 (H) 01/03/2024 1003   BUN 19 01/03/2024 1003   CREATININE 1.04 01/03/2024 1003   CALCIUM  9.7 01/03/2024 1003   PROT 7.1 10/05/2023 0929   ALBUMIN 4.6 10/05/2023 0929   AST 20 10/05/2023 0929   ALT 21 10/05/2023 0929   ALKPHOS 61 10/05/2023 0929   BILITOT 0.8 10/05/2023 0929   GFRNONAA >60 01/03/2024 1003   Lab Results  Component Value Date   CHOL 256 (H) 10/05/2023   HDL 60.20 10/05/2023   LDLCALC 179 (H) 10/05/2023   LDLDIRECT 158.0 09/04/2022   TRIG 86.0 10/05/2023   CHOLHDL 4 10/05/2023   Lab Results  Component Value Date   HGBA1C 5.1 01/12/2024   HGBA1C 5.1 01/12/2024   HGBA1C 5.1 (A) 01/12/2024   HGBA1C 5.1 01/12/2024   No results found for: VITAMINB12 Lab Results  Component Value Date   TSH 3.88 09/04/2022        No data to display               No data to display           ASSESSMENT AND PLAN  58 y.o. year old male  has a past medical history of Achilles tendon disorder, Chronic low back pain, Depression, Diabetes (HCC), GERD (gastroesophageal reflux disease), History of adenomatous polyp of colon (2019), Hyperlipidemia, Hypervitaminosis D (05/03/2020), IBS (irritable bowel syndrome), Insomnia, and Migraine syndrome. here with    Chronic migraine without aura without status migrainosus, not intractable  Chronic low back pain, unspecified back pain laterality, unspecified whether sciatica present  Gracyn Santillanes is doing well. We will continue Botox  every 12 weeks anf naratriptan  as needed. Healthy lifestyle  habits encouraged. He will follow up with PCP as directed. He will return to see me in 1 year, sooner if needed. He verbalizes understanding and agreement with this plan.   No orders of the defined types were placed in this encounter.    Meds ordered this encounter  Medications   naratriptan  (AMERGE) 2.5 MG tablet    Sig: Take one (1) tablet at onset of headache; if returns or  does not resolve, may repeat after 4 hours; do not exceed five (5) mg in 24 hours.    Dispense:  9 tablet    Refill:  11    9 per 30 days    Supervising Provider:   ONITA DUOS [phone]   I personally spent a total of 30 minutes in the care of the patient today including preparing to see the patient, getting/reviewing separately obtained history, performing a medically appropriate exam/evaluation, counseling and educating, and documenting clinical information in the EHR.   Greig Forbes, MSN, FNP-C 01/13/2024, 10:47 AM  [facility] [address] [place], [place] [address] [phone]

## 2024-01-14 ENCOUNTER — Ambulatory Visit: Payer: Self-pay | Admitting: Family Medicine

## 2024-01-14 ENCOUNTER — Other Ambulatory Visit

## 2024-01-15 ENCOUNTER — Other Ambulatory Visit

## 2024-01-18 ENCOUNTER — Other Ambulatory Visit: Payer: Self-pay

## 2024-01-18 DIAGNOSIS — C61 Malignant neoplasm of prostate: Secondary | ICD-10-CM | POA: Diagnosis not present

## 2024-01-18 DIAGNOSIS — R972 Elevated prostate specific antigen [PSA]: Secondary | ICD-10-CM | POA: Diagnosis not present

## 2024-01-18 DIAGNOSIS — N4232 Atypical small acinar proliferation of prostate: Secondary | ICD-10-CM | POA: Diagnosis not present

## 2024-01-24 ENCOUNTER — Telehealth: Payer: Self-pay | Admitting: Family Medicine

## 2024-01-24 DIAGNOSIS — G43709 Chronic migraine without aura, not intractable, without status migrainosus: Secondary | ICD-10-CM

## 2024-01-24 MED ORDER — ONABOTULINUMTOXINA 200 UNITS IJ SOLR: INTRAMUSCULAR | 2 refills | Status: AC

## 2024-01-24 NOTE — Telephone Encounter (Signed)
 Rx sent.

## 2024-01-24 NOTE — Telephone Encounter (Signed)
 I called [employer] to see if I could switch current auth to SP, rep states they only handle B/B. He advised me to submit auth with [facility], shara was approved. Please send rx to [facility], thank you!  Auth#: [id number] (12/25/23-01/23/25)

## 2024-01-24 NOTE — Addendum Note (Signed)
 Addended by: JOSHUA MAURILIO CROME on: 01/24/2024 09:17 AM   Modules accepted: Orders

## 2024-01-25 ENCOUNTER — Ambulatory Visit (INDEPENDENT_AMBULATORY_CARE_PROVIDER_SITE_OTHER): Admitting: Podiatry

## 2024-01-25 ENCOUNTER — Encounter: Payer: Self-pay | Admitting: Podiatry

## 2024-01-25 DIAGNOSIS — M25572 Pain in left ankle and joints of left foot: Secondary | ICD-10-CM

## 2024-01-25 MED ORDER — TRIAMCINOLONE ACETONIDE 40 MG/ML IJ SUSP
40.0000 mg | Freq: Once | INTRAMUSCULAR | Status: AC
  Administered 2024-01-25: 40 mg

## 2024-01-25 NOTE — Progress Notes (Signed)
 Patient presents complaining of first metatarsal phalangeal joint has been painful for him since Sunday.  Last gout flare was about a year and a half ago.   Physical exam:  General appearance: Pleasant, and in no acute distress. AOx3.  Vascular: Pedal pulses: DP 2/4 bilaterally, PT 2/4 bilaterally.  Mild edema lower legs bilaterally. Capillary fill time immediate bilaterally.  Neurological: Grossly intact bilaterally  Dermatologic:   Redness around first MTP 1 left skin normal temperature bilaterally.    Musculoskeletal: Tenderness first metatarsophalangeal joint left.  Unable to assess range of motion due to splinting.   Diagnosis: 1.  Arthralgia first metatarsal phalangeal joint left 2.  Acute gout left  Plan: -Discussed his gout flare. -Ordered labs uric acid -injected 3cc 2:1 mixture 0.5 cc Marcaine:Kenolog 10mg /61ml at first metatarsal phalangeal joint left.    Return 1 week follow-up injection first MTP left

## 2024-01-25 NOTE — Addendum Note (Signed)
 Addended by: Kaushik Maul L on: 01/25/2024 08:48 AM   Modules accepted: Orders

## 2024-01-26 ENCOUNTER — Other Ambulatory Visit (HOSPITAL_COMMUNITY): Payer: Self-pay | Admitting: Urology

## 2024-01-26 DIAGNOSIS — C61 Malignant neoplasm of prostate: Secondary | ICD-10-CM

## 2024-01-27 ENCOUNTER — Other Ambulatory Visit: Payer: Self-pay | Admitting: Podiatry

## 2024-01-27 DIAGNOSIS — M25572 Pain in left ankle and joints of left foot: Secondary | ICD-10-CM | POA: Diagnosis not present

## 2024-01-28 DIAGNOSIS — M5416 Radiculopathy, lumbar region: Secondary | ICD-10-CM | POA: Diagnosis not present

## 2024-01-28 DIAGNOSIS — M48062 Spinal stenosis, lumbar region with neurogenic claudication: Secondary | ICD-10-CM | POA: Diagnosis not present

## 2024-01-28 LAB — URIC ACID: Uric Acid: 7.5 mg/dL (ref 3.8–8.4)

## 2024-01-31 ENCOUNTER — Encounter: Payer: Self-pay | Admitting: Podiatry

## 2024-01-31 ENCOUNTER — Telehealth: Payer: Self-pay

## 2024-01-31 ENCOUNTER — Ambulatory Visit: Admitting: Podiatry

## 2024-01-31 DIAGNOSIS — M25572 Pain in left ankle and joints of left foot: Secondary | ICD-10-CM

## 2024-01-31 MED ORDER — PREDNISONE 5 MG PO TABS: ORAL_TABLET | ORAL | 0 refills | Status: DC

## 2024-01-31 MED ORDER — TRIAMCINOLONE ACETONIDE 40 MG/ML IJ SUSP
40.0000 mg | Freq: Once | INTRAMUSCULAR | Status: AC
  Administered 2024-01-31: 40 mg

## 2024-01-31 NOTE — Telephone Encounter (Signed)
 Patient is a Dr.Petery Patient and came to our office with the merge. Patient was asking about the orthotics that he had made at Dr.Petery's old office. Unsure if those orthotics made the move over with the doctor or not. Would patient need to be re-scanned?

## 2024-01-31 NOTE — Progress Notes (Signed)
 Presents follow-up injection first metatarsal phalangeal joint without gout pain.  The injection helped for the first few days but pain is back to about the same level as it was before.  Fever chills nausea or vomiting.   Physical exam:  General appearance: Pleasant, and in no acute distress. AOx3.  Vascular: Pedal pulses: DP 2/4 bilaterally, PT 2/4 bilaterally. Mild edema lower legs bilaterally. Capillary fill time immediate b/l.  Neurological: Personally intact bilaterally  Dermatologic:   Skin normal temperature bilaterally.  Skin normal color, tone, and texture bilaterally.   Musculoskeletal: Pain at first met of the prior tarsalgia joint left with tenderness with attempts at range of motion.  Severe splinting due to discomfort.   Diagnosis: 1 arthralgia first metatarsophalangeal joint left foot secondary to acute gout.  Plan: -Discussed and continued pain we will try an injection and as well as oral prednisone  to try to get the inflammation down to recommended icing 20 minutes 3-4 times a day. -Discussed allopurinol and colchicine however since he is still having an active flare recommend against this also has had some surgeries coming up so we will hold off on medications until after surgery-injected 3cc 2:1 mixture 0.5 cc Marcaine:Kenolog 10mg /11ml at metatarsophalangeal joint left  Return in 2 weeks.

## 2024-02-03 ENCOUNTER — Encounter (HOSPITAL_COMMUNITY)
Admission: RE | Admit: 2024-02-03 | Discharge: 2024-02-03 | Disposition: A | Discharge status: Home / Self Care | Source: Ambulatory Visit | Attending: Urology | Admitting: Urology

## 2024-02-03 DIAGNOSIS — Z0189 Encounter for other specified special examinations: Secondary | ICD-10-CM | POA: Diagnosis not present

## 2024-02-03 DIAGNOSIS — C61 Malignant neoplasm of prostate: Secondary | ICD-10-CM | POA: Insufficient documentation

## 2024-02-03 MED ORDER — FLOTUFOLASTAT F 18 GALLIUM 296-5846 MBQ/ML IV SOLN
8.5300 | Freq: Once | INTRAVENOUS | Status: AC
  Administered 2024-02-03: 8.53 via INTRAVENOUS

## 2024-02-07 DIAGNOSIS — C61 Malignant neoplasm of prostate: Secondary | ICD-10-CM | POA: Diagnosis not present

## 2024-02-07 DIAGNOSIS — E119 Type 2 diabetes mellitus without complications: Secondary | ICD-10-CM | POA: Diagnosis not present

## 2024-02-07 DIAGNOSIS — Z01818 Encounter for other preprocedural examination: Secondary | ICD-10-CM | POA: Diagnosis not present

## 2024-02-08 NOTE — Progress Notes (Signed)
 12/15/2023 ALL: Carlos Ford returns for Botox . He reports more headaches over the past few weeks. He was recently diagnosed with prostate cancer. He is undergoing revision of lumbar fusion 12/8 then plans to undergo prostate surgery shortly following.   11/17/2023 ALL: Carlos Ford returns for Botox . He reports more headaches over the past three months. Relates to more back pain and not sleeping well. Amerge works well.   08/12/2023 ALL: Carlos Ford returns for Botox . He continues to do well. Migraines remain well managed until last week. Amerge continues to work well.   05/17/2023 ALL: Carlos Ford returns for Botox . He continues to do well. May have 3-4 migraines towad the end of Botox  cycle. Amerge works well for abortive therapy.   02/18/2023 ALL: Carlos Ford returns for Botox . Migraines remain well managed. Last A1C 5.4. he continues to fast regualrly. He is working out and feels great.   11/23/2022 ALL: Carlos Ford returns for Botox . He is doing well form migraine standpoint. He was diagnosed with DMT2. A1C was 13. CBGs have improved. He is fasting.   08/24/2022 ALL: Carlos Ford returns for Botox . He continues to do well. He estimates about 1 migraine a month or less. Amerge works well.   05/14/2022 ALL: He returns for Botox . He continues to do well. Has had two migraines over past 12 weeks. Amerge works well.   02/18/2022 ALL: Carlos Ford continues to do well. May have 1 migraine every 12 weeks.   11/24/2021 ALL: he returns for Botox . He continues to do well. He has taken Amerge twice over the past 12 weeks. It continues to work well for abortive therapy.   09/01/2021 ALL: He returns for Botox . He continues to do well. Amerge continues to work well for abortive therapy.   06/05/2021 ALL: He returns for Botox . He continues to do well. Migraines are rare until the last 2-3 weeks prior to Botox . He continues Amerge as needed.   03/05/2021 ALL: Beatriz returns for follow up. He continues to do well. Amerge works well for abortive therapy. He rarely has  migraines until 2 weeks prior to next Botox  procedure.   11/27/2020 ALL: He returns for Botox . He is doing well. No changes in migraines. He continues Amerge for abortive therapy.   08/26/2020 ALL: He continues to do well on Botox . He does not usually have any migraines until about 1-2 weeks prior to next procedure. Amerge works very well for abortive therapy.   05/16/2020 ALL: He is doing well. Migraines are well managed on Botox . He did not have typical end of cycle worsening of headaches this time. He continues Amerge for abortive therapy.   02/07/2020 ALL: He is doing well. He is now working nights. He does have more headaches toward end of Botox  cycle. Amerge helps.   Consent Form Botulism Toxin Injection For Chronic Migraine    Reviewed orally with patient, additionally signature is on file:  Botulism toxin has been approved by the Federal drug administration for treatment of chronic migraine. Botulism toxin does not cure chronic migraine and it may not be effective in some patients.  The administration of botulism toxin is accomplished by injecting a small amount of toxin into the muscles of the neck and head. Dosage must be titrated for each individual. Any benefits resulting from botulism toxin tend to wear off after 3 months with a repeat injection required if benefit is to be maintained. Injections are usually done every 3-4 months with maximum effect peak achieved by about 2 or 3 weeks. Botulism toxin is expensive  and you should be sure of what costs you will incur resulting from the injection.  The side effects of botulism toxin use for chronic migraine may include:   -Transient, and usually mild, facial weakness with facial injections  -Transient, and usually mild, head or neck weakness with head/neck injections  -Reduction or loss of forehead facial animation due to forehead muscle weakness  -Eyelid drooping  -Dry eye  -Pain at the site of injection or bruising at the site of  injection  -Double vision  -Potential unknown long term risks   Contraindications: You should not have Botox  if you are pregnant, nursing, allergic to albumin, have an infection, skin condition, or muscle weakness at the site of the injection, or have myasthenia gravis, Lambert-Eaton syndrome, or ALS.  It is also possible that as with any injection, there may be an allergic reaction or no effect from the medication. Reduced effectiveness after repeated injections is sometimes seen and rarely infection at the injection site may occur. All care will be taken to prevent these side effects. If therapy is given over a long time, atrophy and wasting in the muscle injected may occur. Occasionally the patient's become refractory to treatment because they develop antibodies to the toxin. In this event, therapy needs to be modified.  I have read the above information and consent to the administration of botulism toxin.    BOTOX  PROCEDURE NOTE FOR MIGRAINE HEADACHE  Contraindications and precautions discussed with patient(above). Aseptic procedure was observed and patient tolerated procedure. Procedure performed by Greig Forbes, FNP-C.   The condition has existed for more than 6 months, and pt does not have a diagnosis of ALS, Myasthenia Gravis or Lambert-Eaton Syndrome.  Risks and benefits of injections discussed and pt agrees to proceed with the procedure.  Written consent obtained  These injections are medically necessary. Pt  receives good benefits from these injections. These injections do not cause sedations or hallucinations which the oral therapies may cause.   Description of procedure:  The patient was placed in a sitting position. The standard protocol was used for Botox  as follows, with 5 units of Botox  injected at each site:  -Procerus muscle, midline injection  -Corrugator muscle, bilateral injection  -Frontalis muscle, bilateral injection, with 2 sites each side, medial injection was  performed in the upper one third of the frontalis muscle, in the region vertical from the medial inferior edge of the superior orbital rim. The lateral injection was again in the upper one third of the forehead vertically above the lateral limbus of the cornea, 1.5 cm lateral to the medial injection site.  -Temporalis muscle injection, 4 sites, bilaterally. The first injection was 3 cm above the tragus of the ear, second injection site was 1.5 cm to 3 cm up from the first injection site in line with the tragus of the ear. The third injection site was 1.5-3 cm forward between the first 2 injection sites. The fourth injection site was 1.5 cm posterior to the second injection site. 5th site laterally in the temporalis  muscleat the level of the outer canthus.  -Occipitalis muscle injection, 3 sites, bilaterally. The first injection was done one half way between the occipital protuberance and the tip of the mastoid process behind the ear. The second injection site was done lateral and superior to the first, 1 fingerbreadth from the first injection. The third injection site was 1 fingerbreadth superiorly and medially from the first injection site.  -Cervical paraspinal muscle injection, 2 sites, bilaterally. The  first injection site was 1 cm from the midline of the cervical spine, 3 cm inferior to the lower border of the occipital protuberance. The second injection site was 1.5 cm superiorly and laterally to the first injection site.  -Trapezius muscle injection was performed at 3 sites, bilaterally. The first injection site was in the upper trapezius muscle halfway between the inflection point of the neck, and the acromion. The second injection site was one half way between the acromion and the first injection site. The third injection was done between the first injection site and the inflection point of the neck.   Will return for repeat injection in 3 months.   A total of 200 units of Botox  was prepared,  155 units of Botox  was injected as documented above, any Botox  not injected was wasted. The patient tolerated the procedure well, there were no complications of the above procedure.

## 2024-02-09 DIAGNOSIS — R350 Frequency of micturition: Secondary | ICD-10-CM | POA: Diagnosis not present

## 2024-02-09 DIAGNOSIS — N401 Enlarged prostate with lower urinary tract symptoms: Secondary | ICD-10-CM | POA: Diagnosis not present

## 2024-02-09 DIAGNOSIS — R3912 Poor urinary stream: Secondary | ICD-10-CM | POA: Diagnosis not present

## 2024-02-09 DIAGNOSIS — C61 Malignant neoplasm of prostate: Secondary | ICD-10-CM | POA: Diagnosis not present

## 2024-02-14 ENCOUNTER — Encounter: Payer: Self-pay | Admitting: Sports Medicine

## 2024-02-14 ENCOUNTER — Ambulatory Visit: Admitting: Family Medicine

## 2024-02-14 ENCOUNTER — Ambulatory Visit: Payer: Self-pay

## 2024-02-14 ENCOUNTER — Ambulatory Visit: Admitting: Sports Medicine

## 2024-02-14 VITALS — BP 147/84 | HR 79 | Temp 97.9°F | Wt 233.0 lb

## 2024-02-14 DIAGNOSIS — R739 Hyperglycemia, unspecified: Secondary | ICD-10-CM

## 2024-02-14 DIAGNOSIS — G43709 Chronic migraine without aura, not intractable, without status migrainosus: Secondary | ICD-10-CM | POA: Diagnosis not present

## 2024-02-14 MED ORDER — ONABOTULINUMTOXINA 200 UNITS IJ SOLR
155.0000 [IU] | Freq: Once | INTRAMUSCULAR | Status: AC
  Administered 2024-02-14: 155 [IU] via INTRAMUSCULAR

## 2024-02-14 NOTE — Progress Notes (Signed)
 Careteam: Patient Care Team: Candise Aleene DEL, MD as PCP - General (Family Medicine) Elma Zachary RAMAN as Consulting Physician (Optometry)  Allergies  Allergen Reactions   Tamsulosin  Other (See Comments)    Side effects: aches and pains, sinus infection, flu like symptoms   Amoxicillin    Penicillins    Pseudoephedrine Palpitations   Sulfa Antibiotics Rash   Wellbutrin [Bupropion] Nausea Only, Anxiety and Other (See Comments)    Insomnia and paranoia    Chief Complaint  Patient presents with   Blood Sugar Problem    Pt has been having elevated blood sugar since mid August. Today it was 117. He would like to know if he can get the sensor back.    Discussed the use of AI scribe software for clinical note transcription with the patient, who gave verbal consent to proceed.  History of Present Illness  Carlos Ford is a 58 year old male who presents for elevated blood sugar control.  He is scheduled for back surgery involving fusion of levels four, five, and six on Monday and is concerned about his blood sugar levels, which have recently increased to the 140s and 150s fasting. He monitors his blood sugar   every morning. Despite maintaining the same diet for the past two years, he has noticed an increase in fasting blood sugars   He has a history of diabetes with previous A1c levels as high as 11.3% last year, but has managed to bring it down to below 7% through diet and exercise without medication over the past year. He has not been on any diabetes medications. He had a recent a1c of 4.9% last week that he believes is inaccurate.  SABRA He reports that he does not have an appetite, but denies fever, chills, or night sweats. He has a history of prednisone  use, with the last dose taken on November 24th.  No fever, cough, or congestion. No shortness of breath, urinary burning, or blood in urine. No abdominal pain, dark stools, or diarrhea.    Review of Systems:  Review of  Systems  Constitutional:  Negative for chills and fever.  HENT:  Negative for congestion and sore throat.   Respiratory:  Negative for cough, sputum production and shortness of breath.   Cardiovascular:  Negative for chest pain, palpitations and leg swelling.  Gastrointestinal:  Negative for abdominal pain, heartburn and nausea.  Genitourinary:  Negative for dysuria, frequency and hematuria.  Musculoskeletal:  Negative for falls and myalgias.  Neurological:  Negative for dizziness.   Negative unless indicated in HPI.   Patient Active Problem List   Diagnosis Date Noted   Other spondylosis with radiculopathy, lumbar region 12/17/2023   Postlaminectomy syndrome, lumbar region 12/17/2023   Pseudoclaudication syndrome 12/17/2023   Diabetes mellitus without complication (HCC) 08/26/2022   Calcific Achilles tendinitis of both lower extremities 12/01/2021   Allergic rhinitis 02/19/2020   Hyperlipidemia with target LDL less than 130 02/19/2020   Irritable bowel syndrome 02/19/2020   Chronic migraine w/o aura w/o status migrainosus, not intractable 05/07/2017   Medication monitoring encounter 12/05/2014   Anxiety, generalized 02/28/2014   Major depressive disorder, single episode, mild 02/28/2014   Primary insomnia 01/31/2014   GERD (gastroesophageal reflux disease) 11/04/2006   Migraine 11/04/2006   Sciatica 09/27/2003   Intervertebral disc disorder of lumbar region with myelopathy 04/22/2001   Past Medical History:  Diagnosis Date   Achilles tendon disorder    bilat calcific tendonopathy (shockwave therapy, Dr. Chick)   Chronic low  back pain    estab emergeortho 11/2020->DDD/postlaminectomy syndrome   Depression 2015   Winter   Diabetes [facility])    GERD (gastroesophageal reflux disease)    History of adenomatous polyp of colon 2019   Recall 2024   Hyperlipidemia    rec'd statin 04/2020   Hypervitaminosis D 05/03/2020   oversupplementation (10K U tab qd for a couple yrs)   IBS  (irritable bowel syndrome)    Insomnia    Migraine syndrome    Dr. Ines   Past Surgical History:  Procedure Laterality Date   COLONOSCOPY N/A 03/07/2018   2019 Adenoma x 1---recall 2024. Procedure: COLONOSCOPY;  Surgeon: Harvey Margo CROME, MD;  [facility]: [facility];  Service: Endoscopy;  Laterality: N/A;  10:30   coronary calcium  score     09/2022 ZERO   Coronary calcium  score     09/2022 ZERO   ESOPHAGOGASTRODUODENOSCOPY  2005   EYE SURGERY  2000   Lasik   LUMBAR EPIDURAL INJECTION  2014   L4-5   LUMBAR LAMINECTOMY  2014   MOUTH SURGERY  2003   POLYPECTOMY  03/07/2018   Procedure: POLYPECTOMY;  Surgeon: Harvey Margo CROME, MD;  Location: [facility];  Service: Endoscopy;;  sigmoid   SPINE SURGERY  2001   2 micrdiskectomies, 1 lamenectomy   Social History   Tobacco Use   Smoking status: Never   Smokeless tobacco: Former  Building Services Engineer status: Never Used  Substance Use Topics   Alcohol use: Not Currently    Comment: occasional   Drug use: No   Family History  Problem Relation Age of Onset   Migraines Mother    Arthritis Mother    Diabetes Father    CAD Father    Heart attack Father    Migraines Sister    Migraines Sister    Diabetes Daughter    Cancer Maternal Grandmother    Early death Maternal Grandfather    Colon cancer Neg Hx    Colon polyps Neg Hx    Allergies  Allergen Reactions   Tamsulosin  Other (See Comments)    Side effects: aches and pains, sinus infection, flu like symptoms   Amoxicillin    Penicillins    Pseudoephedrine Palpitations   Sulfa Antibiotics Rash   Wellbutrin [Bupropion] Nausea Only, Anxiety and Other (See Comments)    Insomnia and paranoia    Medications: Patient's Medications  New Prescriptions   No medications on file  Previous Medications   BOTULINUM TOXIN TYPE A  (BOTOX ) 200 UNITS INJECTION    Provider to inject 155 units into the muscles of the head and neck every 3 months. Discard remainder   CO-ENZYME Q-10 50 MG  CAPSULE    Take 200 mg by mouth daily.   CONTINUOUS GLUCOSE SENSOR (FREESTYLE LIBRE 2 SENSOR) MISC    1 EACH BY DOES NOT APPLY ROUTE EVERY 14 (FOURTEEN) DAYS.   CYANOCOBALAMIN  (VITAMIN B-12 PO)    Take by mouth.   CYCLOBENZAPRINE  (FLEXERIL ) 10 MG TABLET    Take 1 tablet (10 mg total) by mouth 3 (three) times daily as needed for muscle spasms.   GLUCOSE BLOOD (FREESTYLE PRECISION NEO TEST) TEST STRIP    Use to check sugar 1-2 times daily.   MAGNESIUM 500 MG TABS    Take 500 mg by mouth daily.   MULTIPLE VITAMINS-MINERALS (ZINC PO)    Take 12 mg by mouth.   NARATRIPTAN  (AMERGE) 2.5 MG TABLET    Take one (1) tablet  at onset of headache; if returns or does not resolve, may repeat after 4 hours; do not exceed five (5) mg in 24 hours.   PREDNISONE  (DELTASONE ) 5 MG TABLET    12 day taper 6,6,5,5,4,4,3,3,2,2,1,1   PROCHLORPERAZINE  (COMPAZINE ) 10 MG TABLET    Take 1 tablet (10 mg total) by mouth every 6 (six) hours as needed for nausea or vomiting.   SILODOSIN (RAPAFLO) 4 MG CAPS CAPSULE    Take 4 mg by mouth daily.   VITAMIN D  PO    Take 10,000 Int'l Units by mouth.   VITAMIN K PO    Take 200 mg by mouth.  Modified Medications   No medications on file  Discontinued Medications   No medications on file    Physical Exam: There were no vitals filed for this visit. There is no height or weight on file to calculate BMI. BP Readings from Last 3 Encounters:  01/13/24 115/85  01/12/24 118/70  01/07/24 123/78   Wt Readings from Last 3 Encounters:  01/13/24 214 lb 3.2 oz (97.2 kg)  01/12/24 218 lb 12.8 oz (99.2 kg)  01/07/24 218 lb 9.6 oz (99.2 kg)    Physical Exam Constitutional:      Appearance: Normal appearance.  HENT:     Head: Normocephalic and atraumatic.  Cardiovascular:     Rate and Rhythm: Normal rate and regular rhythm.     Pulses: Normal pulses.     Heart sounds: Normal heart sounds.  Pulmonary:     Effort: No respiratory distress.     Breath sounds: No stridor. No wheezing or  rales.  Abdominal:     General: Bowel sounds are normal. There is no distension.     Palpations: Abdomen is soft.     Tenderness: There is no abdominal tenderness. There is no guarding.  Musculoskeletal:        General: No swelling.  Neurological:     Mental Status: He is alert. Mental status is at baseline.     Motor: No weakness.     Labs reviewed: Basic Metabolic Panel: Recent Labs    10/05/23 0929 01/03/24 1003  NA 139 140  K 4.7 4.1  CL 101 105  CO2 31 24  GLUCOSE 87 105*  BUN 18 19  CREATININE 1.03 1.04  CALCIUM  9.4 9.7   Liver Function Tests: Recent Labs    10/05/23 0929  AST 20  ALT 21  ALKPHOS 61  BILITOT 0.8  PROT 7.1  ALBUMIN 4.6   No results for input(s): LIPASE, AMYLASE in the last 8760 hours. No results for input(s): AMMONIA in the last 8760 hours. CBC: Recent Labs    10/05/23 0929 01/03/24 1003  WBC 5.3 5.2  NEUTROABS 3.4 3.3  HGB 14.7 14.9  HCT 43.3 43.0  MCV 87.7 86.0  PLT 223.0 197   Lipid Panel: Recent Labs    02/22/23 0944 10/05/23 0929  CHOL 254* 256*  HDL 51 60.20  LDLCALC 183* 179*  TRIG 87 86.0  CHOLHDL 5.0* 4   TSH: No results for input(s): TSH in the last 8760 hours. A1C: Lab Results  Component Value Date   HGBA1C 5.1 01/12/2024   HGBA1C 5.1 01/12/2024   HGBA1C 5.1 (A) 01/12/2024   HGBA1C 5.1 01/12/2024    Assessment & Plan Blood glucose elevated Impaired fasting glucose Fasting blood sugars elevated, likely due to recent prednisone  use.    Component Ref Range & Units 7 d ago  Hemoglobin A1c 4.8 - 5.6 % 4.9  Avoid high carbohydrate foods Exercise regularly

## 2024-02-14 NOTE — Telephone Encounter (Signed)
 Same day appt made

## 2024-02-14 NOTE — Progress Notes (Signed)
 Botox - 200 units x 1 vial Lot: [id number] Expiration: 08/2025 NDC: 9976-6078-97  Bacteriostatic 0.9% Sodium Chloride - 4 mL  Lot: [id number] Expiration: 01/13/2025 NDC: 9590-8033-97  Dx: H56.290 S/P  Witnessed by Nevelyn Meissner, RMA

## 2024-02-14 NOTE — Telephone Encounter (Signed)
 FYI Only or Action Required?: Action required by provider: request for appointment.  Patient was last seen in primary care on 01/12/2024 by McGowen, Aleene DEL, MD.  Called Nurse Triage reporting Hyperglycemia.  Symptoms began several weeks ago.  Interventions attempted: Nothing.  Symptoms are: unchanged.Pt. Has noticed blood sugars being elevated x several weeks, fasting 160 when usually 90's. Has gained weight. Currently not on medication. Concerned because he has surgery next week.  Triage Disposition: Call PCP Now  Patient/caregiver understands and will follow disposition?: Yes      Copied from CRM #[id number]. Topic: Clinical - Red Word Triage >> Feb 14, 2024  9:22 AM Carlyon D wrote: Red Word that prompted transfer to Nurse Triage: blood sugar is high about a month now can't get it under control,  pt states he may be having liver problems now he is feeling very off and knows something is going on. Reason for Disposition  [1] Caller has URGENT medication or insulin  device (e.g., pump, continuous monitoring) question AND [2] triager unable to answer question  Answer Assessment - Initial Assessment Questions 1. BLOOD GLUCOSE: What is your blood glucose level?      Fasting - 160 2. ONSET: When did you check the blood glucose?     X 1 month 3. USUAL RANGE: What is your glucose level usually? (e.g., usual fasting morning value, usual evening value)     90's 4. KETONES: Do you check for ketones (urine or blood test strips)? If Yes, ask: What does the test show now?      no 5. TYPE 1 or 2:  Do you know what type of diabetes you have?  (e.g., Type 1, Type 2, Gestational; doesn't know)      Type 2  6. INSULIN : Do you take insulin ? What type of insulin (s) do you use? What is the mode of delivery? (syringe, pen; injection or pump)?      no 7. DIABETES PILLS: Do you take any pills for your diabetes? If Yes, ask: Have you missed taking any pills recently?     No pills 8.  OTHER SYMPTOMS: Do you have any symptoms? (e.g., fever, frequent urination, difficulty breathing, dizziness, weakness, vomiting)     Gained weight 9. PREGNANCY: Is there any chance you are pregnant? When was your last menstrual period?     N/a  Protocols used: Diabetes - High Blood Sugar-A-AH

## 2024-02-15 ENCOUNTER — Ambulatory Visit: Admitting: Podiatry

## 2024-02-17 ENCOUNTER — Ambulatory Visit: Admitting: Family Medicine

## 2024-02-21 ENCOUNTER — Ambulatory Visit: Admitting: Family Medicine

## 2024-02-21 DIAGNOSIS — M961 Postlaminectomy syndrome, not elsewhere classified: Secondary | ICD-10-CM | POA: Diagnosis not present

## 2024-02-21 DIAGNOSIS — M48062 Spinal stenosis, lumbar region with neurogenic claudication: Secondary | ICD-10-CM | POA: Diagnosis not present

## 2024-02-21 DIAGNOSIS — M5432 Sciatica, left side: Secondary | ICD-10-CM | POA: Diagnosis not present

## 2024-02-21 DIAGNOSIS — M4726 Other spondylosis with radiculopathy, lumbar region: Secondary | ICD-10-CM | POA: Diagnosis not present

## 2024-02-21 DIAGNOSIS — M5431 Sciatica, right side: Secondary | ICD-10-CM | POA: Diagnosis not present

## 2024-02-22 DIAGNOSIS — Z981 Arthrodesis status: Secondary | ICD-10-CM | POA: Diagnosis not present

## 2024-02-23 DIAGNOSIS — M5416 Radiculopathy, lumbar region: Secondary | ICD-10-CM | POA: Diagnosis not present

## 2024-02-23 DIAGNOSIS — M48062 Spinal stenosis, lumbar region with neurogenic claudication: Secondary | ICD-10-CM | POA: Diagnosis not present

## 2024-02-25 ENCOUNTER — Encounter: Payer: Self-pay | Admitting: Urology

## 2024-02-29 ENCOUNTER — Other Ambulatory Visit: Payer: Self-pay

## 2024-02-29 MED ORDER — NARATRIPTAN HCL 2.5 MG PO TABS: ORAL_TABLET | ORAL | 3 refills | Status: AC

## 2024-03-17 ENCOUNTER — Telehealth: Payer: Self-pay

## 2024-03-17 NOTE — Telephone Encounter (Signed)
 I called pt to introduce myself as  the Coordinator of the [facility].   1. I confirmed with the patient he is aware of his referral to the clinic 1/27, arriving @ 12:30 pm.    2. I discussed the format of the clinic and the physicians he will be seeing that day.   3. I discussed where the clinic is located and how to contact me.   4. I confirmed his address and informed him I would be mailing a packet of information and forms to be completed. I asked him to bring them with him the day of his appointment.    He voiced understanding of the above. I asked him to call me if he has any questions or concerns regarding his appointments or the forms he needs to complete.

## 2024-04-01 ENCOUNTER — Emergency Department (HOSPITAL_BASED_OUTPATIENT_CLINIC_OR_DEPARTMENT_OTHER)

## 2024-04-01 ENCOUNTER — Other Ambulatory Visit: Payer: Self-pay

## 2024-04-01 ENCOUNTER — Encounter (HOSPITAL_BASED_OUTPATIENT_CLINIC_OR_DEPARTMENT_OTHER): Payer: Self-pay | Admitting: Emergency Medicine

## 2024-04-01 ENCOUNTER — Emergency Department (HOSPITAL_BASED_OUTPATIENT_CLINIC_OR_DEPARTMENT_OTHER): Admission: EM | Admit: 2024-04-01 | Discharge: 2024-04-01 | Disposition: A | Discharge status: Home / Self Care

## 2024-04-01 DIAGNOSIS — R2241 Localized swelling, mass and lump, right lower limb: Secondary | ICD-10-CM | POA: Insufficient documentation

## 2024-04-01 DIAGNOSIS — Z8546 Personal history of malignant neoplasm of prostate: Secondary | ICD-10-CM | POA: Insufficient documentation

## 2024-04-01 DIAGNOSIS — M79671 Pain in right foot: Secondary | ICD-10-CM | POA: Diagnosis present

## 2024-04-01 MED ORDER — DOXYCYCLINE HYCLATE 100 MG PO CAPS: 100.0000 mg | ORAL_CAPSULE | Freq: Two times a day (BID) | ORAL | 0 refills | Status: DC

## 2024-04-01 MED ORDER — PREDNISONE 50 MG PO TABS: ORAL_TABLET | ORAL | 0 refills | Status: DC

## 2024-04-01 NOTE — ED Triage Notes (Signed)
 Pt reports right foot pain with swelling and redness. Denies injury to the area.

## 2024-04-01 NOTE — Discharge Instructions (Signed)
 Your x-ray did not show any concerning findings.  I am unclear at this time if your symptoms are due to gout or potentially cellulitis.  Please take 100 mg of the doxycycline  twice daily as well as the prednisone  for the next 5 days.  Please follow-up with your doctor for reevaluation.  If you notice any fevers, wounds that opened up, or redness that is moving up the leg or pain in your calfs please return to the ER for reevaluation.

## 2024-04-01 NOTE — ED Provider Notes (Signed)
 " [facility] AT [facility] Provider Note   CSN: [id number] Arrival date & time: 04/01/24  0845     Patient presents with: Foot Pain   Carlos Ford is a 59 y.o. male.   59 year old male with recently diagnosed prostate cancer who was not undergone treatment yet with no metastatic disease that he is aware of presenting to the emergency department today with pain over his right fifth metatarsal.  The patient states that the pain and redness began yesterday.  He denies any calf/leg pain or swelling.  He denies any fevers.  He came to the emergency department today for further evaluation regarding this due to these ongoing symptoms.  He denies any known injuries but states that he is on his feet a lot so cannot say so for sure.   Foot Pain       Prior to Admission medications  Medication Sig Start Date End Date Taking? Authorizing Provider  doxycycline  (VIBRAMYCIN ) 100 MG capsule Take 1 capsule (100 mg total) by mouth 2 (two) times daily. 04/01/24  Yes Ula Prentice SAUNDERS, MD  predniSONE  (DELTASONE ) 50 MG tablet Take 1 tablet by mouth daily 04/01/24  Yes Ula Prentice SAUNDERS, MD  botulinum toxin Type A  (BOTOX ) 200 units injection Provider to inject 155 units into the muscles of the head and neck every 3 months. Discard remainder Patient not taking: Reported on 02/14/2024 01/24/24   Lomax, Amy, NP  co-enzyme Q-10 50 MG capsule Take 200 mg by mouth daily. Patient not taking: Reported on 02/14/2024    [provider]  Continuous Glucose Sensor (FREESTYLE LIBRE 2 SENSOR) MISC 1 EACH BY DOES NOT APPLY ROUTE EVERY 14 (FOURTEEN) DAYS. Patient not taking: Reported on 02/14/2024 06/03/23   McGowen, Philip H, MD  Cyanocobalamin  (VITAMIN B-12 PO) Take by mouth. Patient not taking: Reported on 02/14/2024    [provider]  cyclobenzaprine  (FLEXERIL ) 10 MG tablet Take 1 tablet (10 mg total) by mouth 3 (three) times daily as needed for muscle spasms. Patient not  taking: Reported on 02/14/2024 09/29/21   McGowen, Philip H, MD  glucose blood (FREESTYLE PRECISION NEO TEST) test strip Use to check sugar 1-2 times daily. Patient not taking: Reported on 02/14/2024 08/27/22   McGowen, Philip H, MD  Magnesium 500 MG TABS Take 500 mg by mouth daily. Patient not taking: Reported on 02/14/2024    [provider]  Multiple Vitamins-Minerals (ZINC PO) Take 12 mg by mouth. Patient not taking: Reported on 02/14/2024    [provider]  naratriptan  (AMERGE) 2.5 MG tablet Take one (1) tablet at onset of headache; if returns or does not resolve, may repeat after 4 hours; do not exceed five (5) mg in 24 hours. 02/29/24   Lomax, Amy, NP  prochlorperazine  (COMPAZINE ) 10 MG tablet Take 1 tablet (10 mg total) by mouth every 6 (six) hours as needed for nausea or vomiting. Patient not taking: Reported on 02/14/2024 10/31/19   Lomax, Amy, NP  silodosin (RAPAFLO) 4 MG CAPS capsule Take 4 mg by mouth daily. 01/04/24   [provider]  VITAMIN D  PO Take 10,000 Int'l Units by mouth. Patient not taking: Reported on 02/14/2024    [provider]  VITAMIN K PO Take 200 mg by mouth. Patient not taking: Reported on 02/14/2024    [provider]    Allergies: Tamsulosin , Amoxicillin, Penicillins, Pseudoephedrine, Sulfa antibiotics, and Wellbutrin [bupropion]    Review of Systems  Musculoskeletal:  Positive for arthralgias.  All  other systems reviewed and are negative.   Updated Vital Signs BP 131/74 (BP Location: Right Arm)   Pulse 85   Temp 97.8 F (36.6 C) (Oral)   Resp 17   SpO2 99%   Physical Exam Vitals and nursing note reviewed.   Gen: NAD Eyes: PERRL, EOMI HEENT: no oropharyngeal swelling Neck: trachea midline Resp: clear to auscultation bilaterally Card: RRR, no murmurs, rubs, or gallops Abd: nontender, nondistended Extremities: no calf tenderness, no edema, the patient has normal range of motion of the right ankle both  actively and passively with no overlying erythema or warmth, the patient does have some erythema over the lateral aspect of the foot with no ulcers or skin breakdown noted.  This is mostly over the 4th and 5th metatarsals Vascular: 2+ radial pulses bilaterally, 2+ DP pulses bilaterally Skin: no rashes Psyc: acting appropriately   (all labs ordered are listed, but only abnormal results are displayed) Labs Reviewed - No data to display  EKG: None  Radiology: DG Foot Complete Right Result Date: 04/01/2024 EXAM: 3 VIEW(S) XRAY OF THE FOOT 04/01/2024 09:06:40 AM COMPARISON: None available. CLINICAL HISTORY: Pain FINDINGS: BONES AND JOINTS: No acute fracture or dislocation. Tiny plantar and Achilles calcaneal spurs. SOFT TISSUES: Unremarkable. IMPRESSION: 1. No acute findings. Electronically signed by: Waddell Calk MD 04/01/2024 09:20 AM EST RP Workstation: [id number]     Procedures   Medications Ordered in the ED - No data to display                                  Medical Decision Making 59 year old male with recently diagnosed prostate cancer as well as diabetes and hyperlipidemia presenting to the emergency department today with pain and swelling of his right foot.  The patient does not have any ankle pain with passive range of motion and exam is not consistent with septic arthritis at this time.  Will obtain x-ray to evaluate for underlying bony abnormalities such as metastatic lesion or osteomyelitis.  This may be due to gout or cellulitis.  If the patient's x-ray is unremarkable we will treat the patient with steroids as well as Keflex to treat for both potentially.  It does appear the patient has been on steroids in the past and has tolerated these well.  He has no calf tenderness or swelling to suggest DVT at this time.  The patient's x-ray does not show any acute findings.  The patient apparently does have a penicillin allergy and has never had cephalosporins.  Will cover him with  doxycycline  instead.  He is discharged with return precautions.  Amount and/or Complexity of Data Reviewed Radiology: ordered.  Risk Prescription drug management.        Final diagnoses:  Localized swelling of right foot    ED Discharge Orders          Ordered    predniSONE  (DELTASONE ) 50 MG tablet        04/01/24 0933    doxycycline  (VIBRAMYCIN ) 100 MG capsule  2 times daily        04/01/24 0933               Ula Prentice SAUNDERS, MD 04/01/24 0935  "

## 2024-04-03 ENCOUNTER — Encounter: Payer: Self-pay | Admitting: Family Medicine

## 2024-04-03 ENCOUNTER — Ambulatory Visit: Admitting: Family Medicine

## 2024-04-03 VITALS — BP 126/77 | HR 80 | Temp 97.6°F | Ht 69.0 in | Wt 226.2 lb

## 2024-04-03 DIAGNOSIS — M79671 Pain in right foot: Secondary | ICD-10-CM

## 2024-04-03 DIAGNOSIS — R2241 Localized swelling, mass and lump, right lower limb: Secondary | ICD-10-CM

## 2024-04-03 DIAGNOSIS — M10071 Idiopathic gout, right ankle and foot: Secondary | ICD-10-CM | POA: Diagnosis not present

## 2024-04-03 MED ORDER — HYDROCODONE-ACETAMINOPHEN 5-325 MG PO TABS: ORAL_TABLET | ORAL | 0 refills | Status: AC

## 2024-04-03 NOTE — Patient Instructions (Signed)
 Low-Purine Diet A low-purine diet involves making choices to limit how much purine you take in. Purine is a kind of uric acid. If you have too much uric acid in your blood, it can cause problems like gout and kidney stones. Eating a low-purine diet can help control those problems. What are tips for following this plan? Shopping Avoid buying things that have high-fructose corn syrup in them. Check for this on food labels. Be sure to check for it in: [employer], such as cookies. Canned fruits. Cereals and cereal bars. Avoid buying meats with a high purine content. These include: Veal. Chicken breast with skin. Lamb. Organ meats, such as liver. Choose dairy products. These may lower uric acid levels. Avoid buying certain types of fish. These include: Anchovies. Trout. Tuna. Sardines. Salmon. Avoid buying drinks with alcohol in them. Alcohol can affect the way your body gets rid of uric acid. Stay away from: [facility]. Hard liquor. Meal planning  Learn which foods do or don't affect you. If you find a food that causes problems, avoid eating that food. If you have any questions about a food item, talk with your health care provider. Eat less meat. When you do eat meat, choose meats that have less purine in them. Eat lots of fruits and vegetables. Even though some vegetables have more purine in them, they don't add as much to the amount of uric acid in your blood as other foods. Have at least one serving of dairy a day. General information If you drink alcohol: Limit how much you have to: 0-1 drink a day if you're male. 0-2 drinks a day if you're male. Know how much alcohol is in your drink. In the [place] one drink is one 12 oz bottle of beer (355 mL), one 5 oz glass of wine (148 mL), or one 1 oz glass of hard liquor (44 mL). Drink more fluids as told. Fluids can help remove uric acid from your body. Work with your provider to make a plan to help you lose weight or stay at a healthy  weight. Losing weight can help lower how much uric acid is in your blood. What foods are recommended? You can have: Fresh or frozen fruits and vegetables. Whole grains, breads, cereals, and pasta. Rice. Beans, peas, and legumes. Nuts and seeds. Dairy products. Fats and oils. The items listed above may not be all the foods and drinks you can have. Talk with an expert in healthy eating called a dietitian to learn more. What foods are not recommended? Limit your intake of foods and drinks high in purines. These include: Beer and other types of alcohol. Meat-based gravy or sauce. Canned or fresh seafood, such as: Anchovies, sardines, herring, salmon, and tuna. Mussels, scallops, shrimp, and crab. Codfish, trout, and haddock. Bacon, veal, chicken breast with skin, and lamb. Organ meats, such as: Liver or kidney. Tripe. Sweetbreads (thymus gland or pancreas). Wild Education officer, environmental. Yeast or yeast extract supplements. Drinks that have been made sweeter with high-fructose corn syrup. These include sodas. Processed foods made with high-fructose corn syrup. The items listed above may not be all the foods and drinks you should limit. Talk with a dietitian to learn more. This information is not intended to replace advice given to you by your health care provider. Make sure you discuss any questions you have with your health care provider. Document Revised: 06/05/2023 Document Reviewed: 06/05/2023 Elsevier Patient Education  2025 [employer].

## 2024-04-03 NOTE — Progress Notes (Signed)
 OFFICE VISIT  04/03/2024  CC:  Chief Complaint  Patient presents with   ED follow up    Pt went to ED on 1/17; rx'd Doxycycline  and Prednisone . Pain in right 5th metatarsal, worse at night and swelling worsens.    Patient is a 59 y.o. male who presents for for follow-up emergency department visit 04/01/2024 for right foot swelling.  HPI: His pain started about 3 days ago in the mid/lateral aspect of right foot. Swelled and turned a little bit red. He had pretty recently changed his diet to Mediterranean diet--> lots of salmon, sardines, muscle, mackerel, herring, shrimp, and also lots of mushrooms lately. He does have a history of gout in the right big toe typically.  He got lumbosacral spine surgery 02/21/2024 and had just been given the go ahead to start exercising again about a week prior to the onset of the foot pain. He presented to the emergency department because of concern for possible DVT since his back surgery was relatively recent, plus he has a history of prostate cancer.  Right foot radiographs in the emergency department 04/01/2024 showed no acute findings.  Review of systems: No fever, no myalgias, no nausea, no other joints are bothering him.  No edema in the lower legs.  Past Medical History:  Diagnosis Date   Achilles tendon disorder    bilat calcific tendonopathy (shockwave therapy, Dr. Chick)   Chronic low back pain    estab emergeortho 11/2020->DDD/postlaminectomy syndrome   Depression 2015   Winter   Diabetes [facility])    GERD (gastroesophageal reflux disease)    History of adenomatous polyp of colon 2019   Recall 2024   Hyperlipidemia    rec'd statin 04/2020   Hypervitaminosis D 05/03/2020   oversupplementation (10K U tab qd for a couple yrs)   IBS (irritable bowel syndrome)    Insomnia    Migraine syndrome    Dr. Ines    Past Surgical History:  Procedure Laterality Date   COLONOSCOPY N/A 03/07/2018   2019 Adenoma x 1---recall 2024. Procedure:  COLONOSCOPY;  Surgeon: Harvey Margo CROME, MD;  [facility];  Service: Endoscopy;  Laterality: N/A;  10:30   coronary calcium  score     09/2022 ZERO   Coronary calcium  score     09/2022 ZERO   ESOPHAGOGASTRODUODENOSCOPY  2005   EYE SURGERY  2000   Lasik   LUMBAR EPIDURAL INJECTION  2014   L4-5   LUMBAR LAMINECTOMY  2014   MOUTH SURGERY  2003   POLYPECTOMY  03/07/2018   Procedure: POLYPECTOMY;  Surgeon: Harvey Margo CROME, MD;  [facility];  Service: Endoscopy;;  sigmoid   SPINE SURGERY  2001   2 micrdiskectomies, 1 lamenectomy    Outpatient Medications Prior to Visit  Medication Sig Dispense Refill   botulinum toxin Type A  (BOTOX ) 200 units injection Provider to inject 155 units into the muscles of the head and neck every 3 months. Discard remainder 1 each 2   co-enzyme Q-10 50 MG capsule Take 200 mg by mouth daily.     Continuous Glucose Sensor (FREESTYLE LIBRE 2 SENSOR) MISC 1 EACH BY DOES NOT APPLY ROUTE EVERY 14 (FOURTEEN) DAYS. 6 each 0   Cyanocobalamin  (VITAMIN B-12 PO) Take by mouth.     cyclobenzaprine  (FLEXERIL ) 10 MG tablet Take 1 tablet (10 mg total) by mouth 3 (three) times daily as needed for muscle spasms. 90 tablet 1   doxycycline  (VIBRAMYCIN ) 100 MG capsule Take 1 capsule (100  mg total) by mouth 2 (two) times daily. 20 capsule 0   glucose blood (FREESTYLE PRECISION NEO TEST) test strip Use to check sugar 1-2 times daily. 100 each 12   Magnesium 500 MG TABS Take 500 mg by mouth daily.     Multiple Vitamins-Minerals (ZINC PO) Take 12 mg by mouth.     naratriptan  (AMERGE) 2.5 MG tablet Take one (1) tablet at onset of headache; if returns or does not resolve, may repeat after 4 hours; do not exceed five (5) mg in 24 hours. 9 tablet 3   predniSONE  (DELTASONE ) 50 MG tablet Take 1 tablet by mouth daily 5 tablet 0   prochlorperazine  (COMPAZINE ) 10 MG tablet Take 1 tablet (10 mg total) by mouth every 6 (six) hours as needed for nausea or vomiting. 30 tablet 0    silodosin (RAPAFLO) 4 MG CAPS capsule Take 4 mg by mouth daily.     VITAMIN D  PO Take 10,000 Int'l Units by mouth.     VITAMIN K PO Take 200 mg by mouth.     No facility-administered medications prior to visit.    Allergies[1]  Review of Systems As per HPI  PE:    04/03/2024   11:19 AM 04/01/2024    8:50 AM 02/14/2024    1:50 PM  Vitals with BMI  Height 5' 9    Weight 226 lbs 3 oz  233 lbs  BMI 33.39    Systolic 126 131 852  Diastolic 77 74 84  Pulse 80 85 79     Physical Exam  General: Alert and well-appearing Legs without any edema.  No tenderness of the calves, no tenderness of the ankle.  He has right midfoot swelling and tenderness and warmth and violaceous hue.  Range of motion of the ankles and toes are intact.  No streaking.  No skin breakdown.  LABS:  Last metabolic panel Lab Results  Component Value Date   GLUCOSE 105 (H) 01/03/2024   NA 140 01/03/2024   K 4.1 01/03/2024   CL 105 01/03/2024   CO2 24 01/03/2024   BUN 19 01/03/2024   CREATININE 1.04 01/03/2024   GFRNONAA >60 01/03/2024   CALCIUM  9.7 01/03/2024   PROT 7.1 10/05/2023   ALBUMIN 4.6 10/05/2023   BILITOT 0.8 10/05/2023   ALKPHOS 61 10/05/2023   AST 20 10/05/2023   ALT 21 10/05/2023   ANIONGAP 11 01/03/2024   Lab Results  Component Value Date   WBC 5.2 01/03/2024   HGB 14.9 01/03/2024   HCT 43.0 01/03/2024   MCV 86.0 01/03/2024   PLT 197 01/03/2024   IMPRESSION AND PLAN:  Acute gout of right midfoot. Recent dietary changes are the culprit. Discussed low purine diet, handout reviewed and given to patient today. He is on his third day of a 6-day course of prednisone . When he is finished with the prednisone  he will utilize over-the-counter NSAIDs if the pain is still mild to moderate but he will make appointment to return for recheck if still severe. Vicodin 5/325, 1-2 tabs twice daily as needed prescribed today, #20.  At next follow-up in the office when we get some lab work I will  add a uric acid.  I personally spent a total of 32 minutes in the care of the patient today including preparing to see the patient, getting/reviewing separately obtained history, performing a medically appropriate exam/evaluation, counseling and educating, placing orders, and documenting clinical information in the EHR.  An After Visit Summary was printed and given  to the patient.  FOLLOW UP: No follow-ups on file.  Signed:  Gerlene Hockey, MD           04/03/2024     [1]  Allergies Allergen Reactions   Tamsulosin  Other (See Comments)    Side effects: aches and pains, sinus infection, flu like symptoms   Amoxicillin    Penicillins    Pseudoephedrine Palpitations   Sulfa Antibiotics Rash   Wellbutrin [Bupropion] Nausea Only, Anxiety and Other (See Comments)    Insomnia and paranoia

## 2024-04-07 NOTE — Progress Notes (Signed)
 RN spoke with patient and reviewed upcoming PMDC on 04/11/24.

## 2024-04-10 LAB — SURGICAL PATHOLOGY

## 2024-04-10 NOTE — Progress Notes (Unsigned)
 [facility] CONSULT NOTE  Patient Care Team: Candise Aleene DEL, MD as PCP - General (Family Medicine) Elma Zachary RAMAN as Consulting Physician (Optometry)  ASSESSMENT & PLAN:  Carlos Ford is a 59 y.o.male with history of chronic back pain, back surgery, GERD, DM being seen at [facility] for prostate cancer.  Initial diagnosis: [id number] GG4 4+4. iPSA 5.1 high risk prostate cancer Treatment: to be determined  His case was discussed at tumor board with multiple specialists including radiation oncologist, urology oncologist, pathologist, radiologist. The patient was counseled on the natural history of prostate cancer and the standard treatment options that are available for prostate cancer.   The NCCN guidelines list both radiation therapy plus ADT (12-36 months) as Category 1 and radical prostatectomy as acceptable initial treatment options for patients with life expectancy greater than 5 years. Discussed risk and benefit of each.  Treatment is recommended. Patient was encouraged to consider the side effect profiles of each treatment modality and make an informed decision for definitive treatment.  Patient will follow-up with radiation oncology or urologic oncology for definitive treatment.  He may follow-up with medical oncology as needed.  Assessment & Plan Malignant neoplasm of prostate (HCC) Follow up for definitive treatment with radiation oncology and urologic oncology calcium  (1000-1200 mg daily from food and supplements) and vitamin D3 (1000 IU daily) Healthy lifestyle to prevent diabetes and CV disease Weight-bearing exercises (30 minutes per day) Limit alcohol consumption and avoid smoking  All questions were answered. The patient knows to call the clinic with any problems, questions or concerns. No barriers to learning was detected.  Carlos JAYSON Chihuahua, MD 1/27/202610:54 PM  CHIEF COMPLAINTS/PURPOSE OF CONSULTATION:  prostate cancer  HISTORY OF PRESENTING  ILLNESS:  Carlos Ford 59 y.o. male is here because of prostate cancer.  I have reviewed his chart and materials related to his cancer extensively and collaborated history with the patient. Summary of oncologic history is as follows:  12/02/23 PSA 5.1  01/06/24 MR prostate: 1. Lesion in the posterior right mid gland PZ concerning for high grade prostate carcinoma, PI RADS 4, region of interest 1.  01/18/24 prostate biopsy: 4+4=8 GG4 in one cores and 4+3 in 6 cores. 3+3 in one core.  02/03/24 PSMA PET: 1. Intense PSMA-avid focus in the posterior right prostate, corresponding to the suspicious lesion on MRI. 2. No PSMA-avid pelvic or retroperitoneal lymphadenopathy. 3. No evidence of mediastinal nodal, pulmonary, or skeletal metastasis.   MEDICAL HISTORY:  Past Medical History:  Diagnosis Date   Achilles tendon disorder    bilat calcific tendonopathy (shockwave therapy, Dr. Chick)   Chronic low back pain    estab emergeortho 11/2020->DDD/postlaminectomy syndrome   Depression 2015   Winter   Diabetes [facility])    Family history of bladder cancer    Family history of kidney cancer    GERD (gastroesophageal reflux disease)    Gout    L MTP 01/2024 (MTP inj + prednisone  by podiatrist).  R foot 03/2024   History of adenomatous polyp of colon 2019   Recall 2024   Hyperlipidemia    rec'd statin 04/2020   Hypervitaminosis D 05/03/2020   oversupplementation (10K U tab qd for a couple yrs)   IBS (irritable bowel syndrome)    Insomnia    Migraine syndrome    Dr. Ines    SURGICAL HISTORY: Past Surgical History:  Procedure Laterality Date   COLONOSCOPY N/A 03/07/2018   2019 Adenoma x 1---recall 2024. Procedure: COLONOSCOPY;  Surgeon: Fields, Sandi  L, MD;  [facility];  Service: Endoscopy;  Laterality: N/A;  10:30   coronary calcium  score     09/2022 ZERO   Coronary calcium  score     09/2022 ZERO   ESOPHAGOGASTRODUODENOSCOPY  2005   EYE SURGERY  2000   Lasik   LUMBAR  EPIDURAL INJECTION  2014   L4-5   LUMBAR LAMINECTOMY  2014   MOUTH SURGERY  2003   POLYPECTOMY  03/07/2018   Procedure: POLYPECTOMY;  Surgeon: Harvey Margo CROME, MD;  [facility];  Service: Endoscopy;;  sigmoid   SPINE SURGERY  2001   2 micrdiskectomies, 1 lamenectomy    SOCIAL HISTORY: Social History   Socioeconomic History   Marital status: Married    Spouse name: Not on file   Number of children: Not on file   Years of education: Not on file   Highest education level: Bachelor's degree (e.g., BA, AB, BS)  Occupational History   Occupation: administrator, arts  Tobacco Use   Smoking status: Never   Smokeless tobacco: Former  Building Services Engineer status: Never Used  Substance and Sexual Activity   Alcohol use: Not Currently    Comment: occasional   Drug use: No   Sexual activity: Yes  Other Topics Concern   Not on file  Social History Narrative   Married, 3 daughters.     Orig from [place] , relocated to [facility] [place].   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   No alc.   No drugs.   Hobby: fishing   Social Drivers of Health   Tobacco Use: Medium Risk (04/11/2024)   Patient History    Smoking Tobacco Use: Never    Smokeless Tobacco Use: Former    Passive Exposure: Not on Actuary Strain: Low Risk (03/24/2024)   Received from [facility]   Overall Financial Resource Strain (CARDIA)    How hard is it for you to pay for the very basics like food, housing, medical care, and heating?: Not hard at all  Food Insecurity: No Food Insecurity (04/11/2024)   Epic    Worried About Programme Researcher, Broadcasting/film/video in the Last Year: Never true    Ran Out of Food in the Last Year: Never true  Transportation Needs: No Transportation Needs (04/11/2024)   Epic    Lack of Transportation (Medical): No    Lack of Transportation (Non-Medical): No  Physical Activity: Insufficiently Active (12/07/2022)   Exercise Vital Sign    Days of Exercise per Week:  4 days    Minutes of Exercise per Session: 30 min  Stress: Not on file  Social Connections: Moderately Integrated (12/07/2022)   Social Connection and Isolation Panel    Frequency of Communication with Friends and Family: Once a week    Frequency of Social Gatherings with Friends and Family: Never    Attends Religious Services: 1 to 4 times per year    Active Member of Clubs or Organizations: Yes    Attends Banker Meetings: 1 to 4 times per year    Marital Status: Married  Catering Manager Violence: Not At Risk (04/11/2024)   Epic    Fear of Current or Ex-Partner: No    Emotionally Abused: No    Physically Abused: No    Sexually Abused: No  Depression (PHQ2-9): Low Risk (04/11/2024)   Depression (PHQ2-9)    PHQ-2 Score: 0  Alcohol Screen: Not on file  Housing: Unknown (04/11/2024)  Epic    Unable to Pay for Housing in the Last Year: No    Number of Times Moved in the Last Year: Not on file    Homeless in the Last Year: No  Utilities: Not At Risk (04/11/2024)   Epic    Threatened with loss of utilities: No  Health Literacy: Not on file    FAMILY HISTORY: Family History  Problem Relation Age of Onset   Migraines Mother    Arthritis Mother    Diabetes Father    CAD Father    Heart attack Father    Migraines Sister    Migraines Sister    Bladder Cancer Paternal Uncle    Kidney cancer Paternal Uncle    Cancer Maternal Grandmother    Early death Maternal Grandfather    Diabetes Daughter    Colon cancer Neg Hx    Colon polyps Neg Hx     ALLERGIES:  is allergic to tamsulosin , amoxicillin, penicillins, pseudoephedrine, sulfa antibiotics, and wellbutrin [bupropion].  MEDICATIONS:  Current Outpatient Medications  Medication Sig Dispense Refill   botulinum toxin Type A  (BOTOX ) 200 units injection Provider to inject 155 units into the muscles of the head and neck every 3 months. Discard remainder 1 each 2   co-enzyme Q-10 50 MG capsule Take 200 mg by mouth daily.      Cyanocobalamin  (VITAMIN B-12 PO) Take by mouth.     cyclobenzaprine  (FLEXERIL ) 10 MG tablet Take 1 tablet (10 mg total) by mouth 3 (three) times daily as needed for muscle spasms. 90 tablet 1   HYDROcodone -acetaminophen  (NORCO/VICODIN) 5-325 MG tablet 1-2 tabs po bid prn 20 tablet 0   Magnesium 500 MG TABS Take 500 mg by mouth daily.     naratriptan  (AMERGE) 2.5 MG tablet Take one (1) tablet at onset of headache; if returns or does not resolve, may repeat after 4 hours; do not exceed five (5) mg in 24 hours. 9 tablet 3   prochlorperazine  (COMPAZINE ) 10 MG tablet Take 1 tablet (10 mg total) by mouth every 6 (six) hours as needed for nausea or vomiting. 30 tablet 0   silodosin (RAPAFLO) 4 MG CAPS capsule Take 4 mg by mouth daily.     VITAMIN D  PO Take 10,000 Int'l Units by mouth.     VITAMIN K PO Take 200 mg by mouth.     No current facility-administered medications for this visit.    REVIEW OF SYSTEMS:   All relevant systems were reviewed with the patient and are negative.  PHYSICAL EXAMINATION: ECOG PERFORMANCE STATUS: 0 VSS  GENERAL: alert, no distress and comfortable  LABORATORY & PATHOLOGY DATA:  I have reviewed the results of labs, PSA and biopsy results related to his cancer.  RADIOGRAPHIC STUDIES: I have reviewed the radiological images related to his cancer during tumor board meeting.

## 2024-04-10 NOTE — Progress Notes (Unsigned)
" ° °                              Care Plan Summary  Name: Carlos Ford DOB: 1966-02-16   Your Medical Team:   Urologist -  Dr. Gretel Ferrara, [facility]  Radiation Oncologist - Dr. Donnice Barge, [facility]   Medical Oncologist - Dr. Pauletta Chihuahua, [facility]  Recommendations: Surgery Hormonal Therapy (ADT) with radiation  Brachy boost with 5 weeks of IMRT 8 weeks of IMRT  * These recommendations are based on information available as of todays consult.      Recommendations may change depending on the results of further tests or exams.    Next Steps: Consider all your options.  Contact Vertell, your nurse navigator, with any questions or treatment decision.   When appointments need to be scheduled, you will be contacted by [facility] and/or [facility].  Questions?  Please do not hesitate to call [name], BSN, RN at [phone] with any questions or concerns.  Vertell is your Oncology Nurse Navigator and is available to assist you while youre receiving your medical care at [facility].  "

## 2024-04-11 ENCOUNTER — Encounter: Payer: Self-pay | Admitting: Radiation Oncology

## 2024-04-11 ENCOUNTER — Other Ambulatory Visit: Payer: Self-pay | Admitting: Genetic Counselor

## 2024-04-11 ENCOUNTER — Inpatient Hospital Stay

## 2024-04-11 ENCOUNTER — Encounter: Payer: Self-pay | Admitting: Urology

## 2024-04-11 ENCOUNTER — Inpatient Hospital Stay: Admitting: Genetic Counselor

## 2024-04-11 ENCOUNTER — Encounter: Payer: Self-pay | Admitting: Genetic Counselor

## 2024-04-11 ENCOUNTER — Ambulatory Visit
Admission: RE | Admit: 2024-04-11 | Discharge: 2024-04-11 | Disposition: A | Discharge status: Home / Self Care | Source: Ambulatory Visit | Attending: Radiation Oncology | Admitting: Radiation Oncology

## 2024-04-11 VITALS — BP 115/60 | HR 87 | Temp 97.4°F | Resp 20 | Ht 69.0 in | Wt 224.8 lb

## 2024-04-11 DIAGNOSIS — C61 Malignant neoplasm of prostate: Secondary | ICD-10-CM

## 2024-04-11 DIAGNOSIS — Z8051 Family history of malignant neoplasm of kidney: Secondary | ICD-10-CM | POA: Insufficient documentation

## 2024-04-11 DIAGNOSIS — Z8052 Family history of malignant neoplasm of bladder: Secondary | ICD-10-CM | POA: Insufficient documentation

## 2024-04-11 NOTE — Progress Notes (Signed)
 "  Radiation Oncology         [phone] ________________________________  Multidisciplinary Prostate Cancer Clinic  Initial Radiation Oncology Consultation  Name: Carlos Ford MRN: [id number]  Date: 04/11/2024  DOB: [birth year]  CC:Carlos Ford, Carlos DEL, MD  Renda Glance, MD   REFERRING PHYSICIAN: Renda Glance, MD  DIAGNOSIS: 59 y.o. gentleman with stage T1c adenocarcinoma of the prostate with a Gleason's score of 4+4 and a PSA of 5.1.    ICD-10-CM   1. Malignant neoplasm of prostate (HCC)  C61       HISTORY OF PRESENT ILLNESS::Carlos Ford is a 59 y.o. gentleman.  He was noted to have progressive LUTS and an elevated PSA of 4.36 by his primary care physician, Dr. Candise.  Accordingly, he was referred for evaluation in urology by Dr. Watt on 12/01/23,  digital rectal examination was performed at that time revealing irregularity in the mid gland. A repeat PSA that day remained elevated at 5.1, prompting further evalaution with prostate MRI. MRIp was performed 01/06/24 and showed a PIRADs 4 lesion in the right posterior mid gland peripheral zone  The patient proceeded to MRI fusion transrectal ultrasound with 13 biopsies of the prostate on 6.  The prostate volume measured 24 cc.  Out of 13 core biopsies,6 were positive.  The maximum Gleason score was 4 + 4, and this was seen in MRI ROI, right apex lateral, right mid, right base. Additionally, Gleason 4 + 3 was seen in the right apex lateral and Gleason 3+3 in the left apex. Unfortunately, he went into acute retention following the biopsy. He was started on Rapaflo and that has resolved.  A PSMA PET was performed 02/03/24 to complete disease staging and showed a dominant intraprostatic lesion on the right and left but no evidence of metastatic disease.    Of note, he had a fairly significant injury to his right side-body at 59 years old which has resulted in persistent issues with his abdominal and pelvic floor muscles. He has also  had multiple procedures for degenerative disc disease in the lumbar spine, most recently a lumbar fusion procedure 6 weeks ago.  The patient reviewed the biopsy results with his urologist and he has kindly been referred today to the multidisciplinary prostate cancer clinic for presentation of pathology and radiology studies in our conference for discussion of potential radiation treatment options and clinical evaluation.  He is accompanied by his wife, Carlos Ford, for today's visit.   PREVIOUS RADIATION THERAPY: No  PAST MEDICAL HISTORY:  has a past medical history of Achilles tendon disorder, Chronic low back pain, Depression (2015), Diabetes (HCC), GERD (gastroesophageal reflux disease), Gout, History of adenomatous polyp of colon (2019), Hyperlipidemia, Hypervitaminosis D (05/03/2020), IBS (irritable bowel syndrome), Insomnia, and Migraine syndrome.    PAST SURGICAL HISTORY: Past Surgical History:  Procedure Laterality Date   COLONOSCOPY N/A 03/07/2018   2019 Adenoma x 1---recall 2024. Procedure: COLONOSCOPY;  Surgeon: Harvey Margo CROME, MD;  Location: [facility];  Service: Endoscopy;  Laterality: N/A;  10:30   coronary calcium  score     09/2022 ZERO   Coronary calcium  score     09/2022 ZERO   ESOPHAGOGASTRODUODENOSCOPY  2005   EYE SURGERY  2000   Lasik   LUMBAR EPIDURAL INJECTION  2014   L4-5   LUMBAR LAMINECTOMY  2014   MOUTH SURGERY  2003   POLYPECTOMY  03/07/2018   Procedure: POLYPECTOMY;  Surgeon: Harvey Margo CROME, MD;  Location: [facility];  Service: Endoscopy;;  sigmoid  SPINE SURGERY  2001   2 micrdiskectomies, 1 lamenectomy    FAMILY HISTORY: family history includes Arthritis in his mother; CAD in his father; Cancer in his maternal grandmother; Diabetes in his daughter and father; Early death in his maternal grandfather; Heart attack in his father; Migraines in his mother, sister, and sister.  SOCIAL HISTORY:  reports that he has never smoked. He has quit using smokeless  tobacco. He reports that he does not currently use alcohol. He reports that he does not use drugs.  ALLERGIES: Tamsulosin , Amoxicillin, Penicillins, Pseudoephedrine, Sulfa antibiotics, and Wellbutrin [bupropion]  MEDICATIONS:  Current Outpatient Medications  Medication Sig Dispense Refill   botulinum toxin Type A  (BOTOX ) 200 units injection Provider to inject 155 units into the muscles of the head and neck every 3 months. Discard remainder 1 each 2   co-enzyme Q-10 50 MG capsule Take 200 mg by mouth daily.     Cyanocobalamin  (VITAMIN B-12 PO) Take by mouth.     cyclobenzaprine  (FLEXERIL ) 10 MG tablet Take 1 tablet (10 mg total) by mouth 3 (three) times daily as needed for muscle spasms. 90 tablet 1   HYDROcodone -acetaminophen  (NORCO/VICODIN) 5-325 MG tablet 1-2 tabs po bid prn 20 tablet 0   Magnesium 500 MG TABS Take 500 mg by mouth daily.     naratriptan  (AMERGE) 2.5 MG tablet Take one (1) tablet at onset of headache; if returns or does not resolve, may repeat after 4 hours; do not exceed five (5) mg in 24 hours. 9 tablet 3   prochlorperazine  (COMPAZINE ) 10 MG tablet Take 1 tablet (10 mg total) by mouth every 6 (six) hours as needed for nausea or vomiting. 30 tablet 0   silodosin (RAPAFLO) 4 MG CAPS capsule Take 4 mg by mouth daily.     VITAMIN D  PO Take 10,000 Int'l Units by mouth.     VITAMIN K PO Take 200 mg by mouth.     No current facility-administered medications for this encounter.    REVIEW OF SYSTEMS:  On review of systems, the patient reports that he is doing well overall. He denies any chest pain, shortness of breath, cough, fevers, chills, night sweats, unintended weight changes. He denies any bowel disturbances, and denies abdominal pain, nausea or vomiting. He denies any new musculoskeletal or joint aches or pains. His IPSS was 10, indicating mild-moderate urinary symptoms which are improved on Rapaflo daily. His SHIM was 21, indicating he does not have erectile dysfunction. A  complete review of systems is obtained and is otherwise negative.   PHYSICAL EXAM:  Wt Readings from Last 3 Encounters:  04/11/24 224 lb 12.8 oz (102 kg)  04/03/24 226 lb 3.2 oz (102.6 kg)  02/14/24 233 lb (105.7 kg)   Temp Readings from Last 3 Encounters:  04/11/24 (!) 97.4 F (36.3 C)  04/03/24 97.6 F (36.4 C) (Temporal)  04/01/24 97.8 F (36.6 C) (Oral)   BP Readings from Last 3 Encounters:  04/11/24 115/60  04/03/24 126/77  04/01/24 131/74   Pulse Readings from Last 3 Encounters:  04/11/24 87  04/03/24 80  04/01/24 85   Pain Assessment Pain Loc: Back (lower back/joint pain (arthritis/muscle ache))/10  In general this is a well appearing Caucasian man in no acute distress. He's alert and oriented x4 and appropriate throughout the examination. Cardiopulmonary assessment is negative for acute distress and he exhibits normal effort.    KPS = 100  100 - Normal; no complaints; no evidence of disease. 90   - Able  to carry on normal activity; minor signs or symptoms of disease. 80   - Normal activity with effort; some signs or symptoms of disease. 59   - Cares for self; unable to carry on normal activity or to do active work. 60   - Requires occasional assistance, but is able to care for most of his personal needs. 50   - Requires considerable assistance and frequent medical care. 40   - Disabled; requires special care and assistance. 30   - Severely disabled; hospital admission is indicated although death not imminent. 20   - Very sick; hospital admission necessary; active supportive treatment necessary. 10   - Moribund; fatal processes progressing rapidly. 0     - Dead  Karnofsky DA, Abelmann WH, Craver LS and Burchenal JH [phone]) The use of the nitrogen mustards in the palliative treatment of carcinoma: with particular reference to bronchogenic carcinoma Cancer 1 [id number]   LABORATORY DATA:  Lab Results  Component Value Date   WBC 5.2 01/03/2024   HGB 14.9 01/03/2024    HCT 43.0 01/03/2024   MCV 86.0 01/03/2024   PLT 197 01/03/2024   Lab Results  Component Value Date   NA 140 01/03/2024   K 4.1 01/03/2024   CL 105 01/03/2024   CO2 24 01/03/2024   Lab Results  Component Value Date   ALT 21 10/05/2023   AST 20 10/05/2023   ALKPHOS 61 10/05/2023   BILITOT 0.8 10/05/2023     RADIOGRAPHY: DG Foot Complete Right Result Date: 04/01/2024 EXAM: 3 VIEW(S) XRAY OF THE FOOT 04/01/2024 09:06:40 AM COMPARISON: None available. CLINICAL HISTORY: Pain FINDINGS: BONES AND JOINTS: No acute fracture or dislocation. Tiny plantar and Achilles calcaneal spurs. SOFT TISSUES: Unremarkable. IMPRESSION: 1. No acute findings. Electronically signed by: Waddell Calk MD 04/01/2024 09:20 AM EST RP Workstation: [id number]      IMPRESSION/PLAN: 59 y.o. gentleman with Stage T1c adenocarcinoma of the prostate with a Gleason's score of 4+4 and a PSA of 5.1.  We discussed the patient's workup and outlined the nature of prostate cancer in this setting. The patient's T stage, Gleason's score, and PSA put him into the high risk group. Accordingly, he is eligible for a variety of potential treatment options including LT-ADT concurrent with either 8 weeks of external radiation or 5 weeks of external radiation with an upfront brachytherapy boost, or prostatectomy. We discussed the available radiation techniques, and focused on the details and logistics of delivery. We discussed and outlined the risks, benefits, short and long-term effects associated with radiotherapy and compared and contrasted these with prostatectomy. We discussed the role of rectal spacer gel in reducing the rectal toxicity associated with radiotherapy. We also detailed the role of ADT in the treatment of high risk prostate cancer and outlined the associated side effects that could be expected with this therapy. He appears to have a good understanding of his disease and our treatment recommendations which are of curative  intent. He and his wife were encouraged to ask questions that were answered to their stated satisfaction.  At the end of the conversation the patient is undecided regarding his treatment preference but appears to be leaning towards ADT concurrent with 5 weeks of external radiation with an upfront brachytherapy boost.  He would like to take some additional time to consider his options and has our contact information so that he can let us  know once he reaches a final decision.  If he ultimately elects to proceed with the seed boost and daily  radiation combo, we will make arrangements for a follow-up visit in the urology office, first available, to start ADT now and then coordinate for the seed boost procedure in late March/early April 2026. We will see him back 2-3 weeks after his seed boost procedure and will proceed with CT SIM prostate for treatment planning in anticipation of beginning the 5 week course of daily external beam radiation 3-4 weeks after the seed boost procedure.  We enjoyed meeting him and his wife today and look forward to continue to participate in his care.  We personally spent 60 minutes in this encounter including chart review, reviewing radiological studies, meeting face-to-face with the patient, entering orders and completing documentation.    Sabra MICAEL Rusk, PA-C    Donnice Barge, MD  [facility]  Radiation Oncology Direct Dial : [phone]  Fax: [phone] [email or web address]  Skype  LinkedIn    "

## 2024-04-11 NOTE — Assessment & Plan Note (Addendum)
 Follow up for definitive treatment with radiation oncology and urologic oncology calcium  (1000-1200 mg daily from food and supplements) and vitamin D3 (1000 IU daily) Healthy lifestyle to prevent diabetes and CV disease Weight-bearing exercises (30 minutes per day) Limit alcohol consumption and avoid smoking

## 2024-04-11 NOTE — Progress Notes (Signed)
 REFERRING PROVIDER: Tina Pauletta BROCKS, MD [address] [place],  [place] [address]  PRIMARY PROVIDER:  Candise Aleene DEL, MD  PRIMARY REASON FOR VISIT:  1. Family history of bladder cancer   2. Family history of kidney cancer   3. Malignant neoplasm of prostate (HCC)      HISTORY OF PRESENT ILLNESS:   Carlos Ford, a 59 y.o. male, was seen for a [place] cancer genetics consultation at the request of Dr. Tina due to a personal and family history of cancer.  Carlos Ford presents to clinic today to discuss the possibility of a hereditary predisposition to cancer, genetic testing, and to further clarify his future cancer risks, as well as potential cancer risks for family members.   In November 2025, at the age of 89, Carlos Ford was diagnosed with prostate cancer.  The Gleason score = 8.     CANCER HISTORY:  Oncology History  Malignant neoplasm of prostate (HCC)  01/18/2024 Cancer Staging   Staging form: Prostate, AJCC 8th Edition - Clinical stage from 01/18/2024: Stage IIC (cT1c, cN0, cM0, PSA: 5.1, Grade Group: 4) - Signed by Sherwood Rise, PA-C on 04/11/2024 Histopathologic type: Adenocarcinoma, NOS Stage prefix: Initial diagnosis Prostate specific antigen (PSA) range: Less than 10 Gleason primary pattern: 4 Gleason secondary pattern: 4 Gleason score: 8 Histologic grading system: 5 grade system Number of biopsy cores examined: 13 Number of biopsy cores positive: 6 Location of positive needle core biopsies: Both sides   04/11/2024 Initial Diagnosis   Malignant neoplasm of prostate [facility])     Past Medical History:  Diagnosis Date   Achilles tendon disorder    bilat calcific tendonopathy (shockwave therapy, Dr. Chick)   Chronic low back pain    estab emergeortho 11/2020->DDD/postlaminectomy syndrome   Depression 2015   Winter   Diabetes [facility])    Family history of bladder cancer    Family history of kidney cancer    GERD (gastroesophageal reflux disease)     Gout    L MTP 01/2024 (MTP inj + prednisone  by podiatrist).  R foot 03/2024   History of adenomatous polyp of colon 2019   Recall 2024   Hyperlipidemia    rec'd statin 04/2020   Hypervitaminosis D 05/03/2020   oversupplementation (10K U tab qd for a couple yrs)   IBS (irritable bowel syndrome)    Insomnia    Migraine syndrome    Dr. Ines    Past Surgical History:  Procedure Laterality Date   COLONOSCOPY N/A 03/07/2018   2019 Adenoma x 1---recall 2024. Procedure: COLONOSCOPY;  Surgeon: Harvey Margo CROME, MD;  [facility];  Service: Endoscopy;  Laterality: N/A;  10:30   coronary calcium  score     09/2022 ZERO   Coronary calcium  score     09/2022 ZERO   ESOPHAGOGASTRODUODENOSCOPY  2005   EYE SURGERY  2000   Lasik   LUMBAR EPIDURAL INJECTION  2014   L4-5   LUMBAR LAMINECTOMY  2014   MOUTH SURGERY  2003   POLYPECTOMY  03/07/2018   Procedure: POLYPECTOMY;  Surgeon: Harvey Margo CROME, MD;  Location: [facility];  Service: Endoscopy;;  sigmoid   SPINE SURGERY  2001   2 micrdiskectomies, 1 lamenectomy    Social History   Socioeconomic History   Marital status: Married    Spouse name: Not on file   Number of children: Not on file   Years of education: Not on file   Highest education level: Bachelor's degree (e.g.,  BA, AB, BS)  Occupational History   Occupation: administrator, arts  Tobacco Use   Smoking status: Never   Smokeless tobacco: Former  Building Services Engineer status: Never Used  Substance and Sexual Activity   Alcohol use: Not Currently    Comment: occasional   Drug use: No   Sexual activity: Yes  Other Topics Concern   Not on file  Social History Narrative   Married, 3 daughters.     Orig from [place] , relocated to [facility] 2018.   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   No alc.   No drugs.   Hobby: fishing   Social Drivers of Health   Tobacco Use: Medium Risk (04/11/2024)   Patient History    Smoking Tobacco Use: Never     Smokeless Tobacco Use: Former    Passive Exposure: Not on Actuary Strain: Low Risk (03/24/2024)   Received from [facility]   Overall Financial Resource Strain (CARDIA)    How hard is it for you to pay for the very basics like food, housing, medical care, and heating?: Not hard at all  Food Insecurity: No Food Insecurity (04/11/2024)   Epic    Worried About Programme Researcher, Broadcasting/film/video in the Last Year: Never true    Ran Out of Food in the Last Year: Never true  Transportation Needs: No Transportation Needs (04/11/2024)   Epic    Lack of Transportation (Medical): No    Lack of Transportation (Non-Medical): No  Physical Activity: Insufficiently Active (12/07/2022)   Exercise Vital Sign    Days of Exercise per Week: 4 days    Minutes of Exercise per Session: 30 min  Stress: Not on file  Social Connections: Moderately Integrated (12/07/2022)   Social Connection and Isolation Panel    Frequency of Communication with Friends and Family: Once a week    Frequency of Social Gatherings with Friends and Family: Never    Attends Religious Services: 1 to 4 times per year    Active Member of [employer] or Organizations: Yes    Attends Banker Meetings: 1 to 4 times per year    Marital Status: Married  Depression (PHQ2-9): Low Risk (04/11/2024)   Depression (PHQ2-9)    PHQ-2 Score: 0  Alcohol Screen: Not on file  Housing: Unknown (04/11/2024)   Epic    Unable to Pay for Housing in the Last Year: No    Number of Times Moved in the Last Year: Not on file    Homeless in the Last Year: No  Utilities: Not At Risk (04/11/2024)   Epic    Threatened with loss of utilities: No  Health Literacy: Not on file     FAMILY HISTORY:  We obtained a detailed, 4-generation family history.  Significant diagnoses are listed below: Family History  Problem Relation Age of Onset   Migraines Mother    Arthritis Mother    Diabetes Father    CAD Father    Heart attack Father    Migraines  Sister    Migraines Sister    Bladder Cancer Paternal Uncle    Kidney cancer Paternal Uncle    Cancer Maternal Grandmother    Early death Maternal Grandfather    Diabetes Daughter    Colon cancer Neg Hx    Colon polyps Neg Hx      Significant family history reported includes: Paternal uncle with bladder and kidney cancer  Carlos Ford is unaware of previous  family history of genetic testing for hereditary cancer risks.  There is no reported Ashkenazi Jewish ancestry. There is no known consanguinity.  GENETIC COUNSELING ASSESSMENT: Carlos Ford is a 60 y.o. male with a personal and family history of cancer which is somewhat suggestive of a hereditary cancer syndrome and predisposition to cancer given his high Gleason score and combination of cancer. We, therefore, discussed and recommended the following at today's visit.   DISCUSSION: We discussed that, in general, most cancer is not inherited in families, but instead is sporadic or familial. Sporadic cancers occur by chance and typically happen at older ages (>50 years) as this type of cancer is caused by genetic changes acquired during an individuals lifetime. Some families have more cancers than would be expected by chance; however, the ages or types of cancer are not consistent with a known genetic mutation or known genetic mutations have been ruled out. This type of familial cancer is thought to be due to a combination of multiple genetic, environmental, hormonal, and lifestyle factors. While this combination of factors likely increases the risk of cancer, the exact source of this risk is not currently identifiable or testable.  We discussed that 5 - 10% of prostate cancer is hereditary, with most cases associated with BRCA mutations.  There are other genes that can be associated with hereditary prostate cancer syndromes.  These include ATM, PALB2, HOXB13 and a few others..  We discussed that testing is beneficial for several reasons  including knowing how to follow individuals after completing their treatment, identifying whether potential treatment options such as PARP inhibitors would be beneficial, and understand if other family members could be at risk for cancer and allow them to undergo genetic testing.   We reviewed the characteristics, features and inheritance patterns of hereditary cancer syndromes. We also discussed genetic testing, including the appropriate family members to test, the process of testing, insurance coverage and turn-around-time for results. We discussed the implications of a negative, positive, carrier and/or variant of uncertain significant result.  Carlos Ford will pursue genetic testing for the CancerNext-Expanded+RNAinsight gene panel.   The CancerNext-Expanded gene panel offered by [facility] and includes sequencing, rearrangement, and RNA analysis for the following 77 genes: AIP, ALK, APC, ATM, BAP1, BARD1, BMPR1A, BRCA1, BRCA2, BRIP1, CDC73, CDH1, CDK4, CDKN1B, CDKN2A, CEBPA, CHEK2, CTNNA1, DDX41, DICER1, ETV6, FH, FLCN, GATA2, LZTR1, MAX, MBD4, MEN1, MET, MLH1, MSH2, MSH3, MSH6, MUTYH, NF1, NF2, NTHL1, PALB2, PHOX2B, PMS2, POT1, PRKAR1A, PTCH1, PTEN, RAD51C, RAD51D, RB1, RET, RPS20, RUNX1, SDHA, SDHAF2, SDHB, SDHC, SDHD, SMAD4, SMARCA4, SMARCB1, SMARCE1, STK11, SUFU, TMEM127, TP53, TSC1, TSC2, VHL, and WT1 (sequencing and deletion/duplication); AXIN2, CTNNA1, DDX41, EGFR, HOXB13, KIT, MBD4, MITF, MSH3, PDGFRA, POLD1 and POLE (sequencing only); EPCAM and GREM1 (deletion/duplication only). RNA data is routinely analyzed for use in variant interpretation for all genes.   Based on Carlos Ford's personal and family history of cancer, he meets medical criteria for genetic testing.  Despite that he meets criteria, he may still have an out of pocket cost. We discussed that if his out of pocket cost for testing is over $100, the laboratory will call and confirm whether he wants to proceed with testing.   If the out of pocket cost of testing is less than $100 he will be billed by the genetic testing laboratory.   We discussed that some people do not want to undergo genetic testing due to fear of genetic discrimination.  The Genetic Information Nondiscrimination Act (GINA) was signed into federal law  in 2008. GINA prohibits health insurers and most employers from discriminating against individuals based on genetic information (including the results of genetic tests and family history information). According to GINA, health insurance companies cannot consider genetic information to be a preexisting condition, nor can they use it to make decisions regarding coverage or rates. GINA also makes it illegal for most employers to use genetic information in making decisions about hiring, firing, promotion, or terms of employment. It is important to note that GINA does not offer protections for life insurance, disability insurance, or long-term care insurance. GINA does not apply to those in the [employer], those who work for companies with less than 15 employees, and new life insurance or long-term disability insurance policies.  Health status due to a cancer diagnosis is not protected under GINA. More information about GINA can be found by visiting eliteclients.be.  PLAN: After considering the risks, benefits, and limitations, Carlos Ford provided informed consent to pursue genetic testing and the blood sample was sent to [facility] for analysis of the CancerNext-Expanded+RNAinsight. Results should be available within approximately 2-3 weeks' time, at which point they will be disclosed by telephone to Carlos Ford, as will any additional recommendations warranted by these results. Carlos Ford will receive a summary of his genetic counseling visit and a copy of his results once available. This information will also be available in Epic.   Lastly, we encouraged Carlos Ford to remain in contact  with cancer genetics annually so that we can continuously update the family history and inform him of any changes in cancer genetics and testing that may be of benefit for this family.   Carlos Ford questions were answered to his satisfaction today. Our contact information was provided should additional questions or concerns arise. Thank you for the referral and allowing us  to share in the care of your patient.   Kippy Gohman P. Perri, MS, CGC Licensed, Patent Attorney [email or web address] phone: [phone]  I personally spent a total of 30 minutes in the care of the patient today including preparing to see the patient, getting/reviewing separately obtained history, counseling and educating, placing orders, referring and communicating with other health care professionals, and documenting clinical information in the EHR. The patient brought his wife. Drs. Lanny Stalls, and/or Gudena were available for questions, if needed..    _______________________________________________________________________ For Office Staff:  Number of people involved in session: 2 Was an Intern/ student involved with case: no

## 2024-04-12 ENCOUNTER — Inpatient Hospital Stay

## 2024-04-12 ENCOUNTER — Ambulatory Visit: Admitting: Podiatry

## 2024-04-12 DIAGNOSIS — M25571 Pain in right ankle and joints of right foot: Secondary | ICD-10-CM | POA: Diagnosis not present

## 2024-04-12 DIAGNOSIS — C61 Malignant neoplasm of prostate: Secondary | ICD-10-CM

## 2024-04-12 LAB — GENETIC SCREENING ORDER

## 2024-04-12 NOTE — Progress Notes (Signed)
 Patient presents with complaint of gout flare in the right foot.  Began little over a week ago had a lot of pain and swelling and redness in the foot.  He went to the ER they put him on 50 mg initial dose of prednisone  over 5 or 6 days.  Says is doing better but still pretty painful most of the pain seems to be around the ankle.  Says it is worse toward there is some pain around the first metatarsal phalangeal joint on the right foot also.  No fever chills nausea or vomiting.  Patient is currently being treated for prostate cancer but he is not on any chemotherapy at the moment.   Physical exam:  General appearance: Pleasant, and in no acute distress. AOx3.  Vascular: Pedal pulses: DP 2/4 bilaterally, PT 2/4 bilaterally.  Increased edema in the right foot.  Capillary fill time immediate b/l.SABRA  Neurological: Grossly intact bilaterally  Dermatologic:   Skin normal temperature bilaterally.  Skin normal color, tone, and texture bilaterally.   Musculoskeletal: Tenderness at ankle joint palpation and range of motion right.  Tenderness at first metatarsal phalangeal joint with palpation and range of motion right.  Swelling generally in the right foot with some redness present.    Diagnosis: 1.  Arthralgia ankle joint right secondary to gout  Plan: -Discussed with him gout and gout flares.  He had his back surgery.  To go to make a decision what to do as far as a prostate cancer with radiation or surgery or chemo in the next week.  Long-term would like to put him on colchicine and allopurinol.  When that we will get him through this flare first before we start those meds. -Labs uric acid  - Injected 3cc 2:1 mixture 0.5 cc Marcaine: Triamcinolone  40mg /56ml at ankle joint right.    Return 1 week follow-up injection ankle and potentially start allopurinol and colchicine.

## 2024-04-13 LAB — URIC ACID: Uric Acid: 5.6 mg/dL (ref 3.8–8.4)

## 2024-04-14 DIAGNOSIS — M25571 Pain in right ankle and joints of right foot: Secondary | ICD-10-CM | POA: Diagnosis not present

## 2024-04-14 MED ORDER — TRIAMCINOLONE ACETONIDE 40 MG/ML IJ SUSP
40.0000 mg | Freq: Once | INTRAMUSCULAR | Status: AC
  Administered 2024-04-14: 40 mg

## 2024-04-14 NOTE — Addendum Note (Signed)
 Addended by: CHRISTINE BARTER A on: 04/14/2024 10:01 AM   Modules accepted: Orders

## 2024-04-19 ENCOUNTER — Ambulatory Visit: Admitting: Podiatry

## 2024-04-19 DIAGNOSIS — M25571 Pain in right ankle and joints of right foot: Secondary | ICD-10-CM

## 2024-04-19 MED ORDER — TRIAMCINOLONE ACETONIDE 10 MG/ML IJ SUSP
10.0000 mg | Freq: Once | INTRAMUSCULAR | Status: AC
  Administered 2024-04-19: 10 mg

## 2024-04-19 MED ORDER — PREDNISONE 5 MG PO TABS: ORAL_TABLET | ORAL | 1 refills | Status: AC

## 2024-04-19 NOTE — Progress Notes (Signed)
 Patient presents 2 weeks status post injection ankle joint right for acute gout.  Doing better as far as the ankle but still having pain in the sinus tarsi little bit of pain at the fourth fifth met cuboid joint of the right foot.  Still little bit of swelling in the foot.   Physical exam:  General appearance: Pleasant, and in no acute distress. AOx3.  Vascular: Pedal pulses: DP 2/4 bilaterally, PT 2/4 bilaterally.  Mild edema lower legs bilaterally. Capillary fill time bilaterally.  Neurological: Grossly intact bilaterally  Dermatologic:   Skin normal temperature bilaterally.  Skin normal color, tone, and texture bilaterally.   Musculoskeletal: Tenderness at the sinus tarsi and with range of motion subtalar joint right.  No tenderness along the ankle joint proper today.  Some tenderness at the fourth fifth met cuboid joint right.    Diagnosis: 1.  Arthralgia subtalar joint right  Plan: -Still some pain although improved.  Pain now is strictly at the subtalar joint and sinus tarsi with some at the fourth fifth met cuboid joint right.  His uric acid was 5.6 which is in therapeutic range.  Will try an injection in the subtalar joint today as well as oral prednisone . - Rx prednisone  5 mg, 30 mg p.o. daily first day, then decrease by 5 mg every other day for 12 days.   - Injected 3cc 2:1 mixture 0.5 cc Marcaine: Triamcinolone  40mg /9ml at subtalar joint and sinus tarsi right.    Return 2 weeks follow-up injection STJ right

## 2024-04-20 ENCOUNTER — Telehealth: Payer: Self-pay | Admitting: Genetic Counselor

## 2024-04-20 ENCOUNTER — Ambulatory Visit: Payer: Self-pay | Admitting: Genetic Counselor

## 2024-04-20 DIAGNOSIS — Z1379 Encounter for other screening for genetic and chromosomal anomalies: Secondary | ICD-10-CM | POA: Insufficient documentation

## 2024-04-20 NOTE — Progress Notes (Signed)
 HPI:  Mr. Dambrosia was previously seen in the [facility] due to a personal and family history of cancer and concerns regarding a hereditary predisposition to cancer. Please refer to our prior cancer genetics clinic note for more information regarding our discussion, assessment and recommendations, at the time. Mr. Lacomb recent genetic test results were disclosed to him, as were recommendations warranted by these results. These results and recommendations are discussed in more detail below.  CANCER HISTORY:  Oncology History  Malignant neoplasm of prostate (HCC)  01/18/2024 Cancer Staging   Staging form: Prostate, AJCC 8th Edition - Clinical stage from 01/18/2024: Stage IIC (cT1c, cN0, cM0, PSA: 5.1, Grade Group: 4) - Signed by Sherwood Rise, PA-C on 04/11/2024 Histopathologic type: Adenocarcinoma, NOS Stage prefix: Initial diagnosis Prostate specific antigen (PSA) range: Less than 10 Gleason primary pattern: 4 Gleason secondary pattern: 4 Gleason score: 8 Histologic grading system: 5 grade system Number of biopsy cores examined: 13 Number of biopsy cores positive: 6 Location of positive needle core biopsies: Both sides   04/11/2024 Initial Diagnosis   Malignant neoplasm of prostate (HCC)   04/17/2024 Genetic Testing   Negative genetic testing. HOXB13 p.R217C (c.649C>T) VUS identified.  The CancerNext-Expanded gene panel offered by [facility] and includes sequencing, rearrangement, and RNA analysis for the following 77 genes: AIP, ALK, APC, ATM, BAP1, BARD1, BMPR1A, BRCA1, BRCA2, BRIP1, CDC73, CDH1, CDK4, CDKN1B, CDKN2A, CEBPA, CHEK2, CTNNA1, DDX41, DICER1, ETV6, FH, FLCN, GATA2, LZTR1, MAX, MBD4, MEN1, MET, MLH1, MSH2, MSH3, MSH6, MUTYH, NF1, NF2, NTHL1, PALB2, PHOX2B, PMS2, POT1, PRKAR1A, PTCH1, PTEN, RAD51C, RAD51D, RB1, RET, RPS20, RUNX1, SDHA, SDHAF2, SDHB, SDHC, SDHD, SMAD4, SMARCA4, SMARCB1, SMARCE1, STK11, SUFU, TMEM127, TP53, TSC1, TSC2, VHL, and WT1  (sequencing and deletion/duplication); AXIN2, CTNNA1, DDX41, EGFR, HOXB13, KIT, MBD4, MITF, MSH3, PDGFRA, POLD1 and POLE (sequencing only); EPCAM and GREM1 (deletion/duplication only). RNA data is routinely analyzed for use in variant interpretation for all genes.      FAMILY HISTORY:  We obtained a detailed, 4-generation family history.  Significant diagnoses are listed below: Family History  Problem Relation Age of Onset   Migraines Mother    Arthritis Mother    Diabetes Father    CAD Father    Heart attack Father    Migraines Sister    Migraines Sister    Bladder Cancer Paternal Uncle    Kidney cancer Paternal Uncle    Cancer Maternal Grandmother    Early death Maternal Grandfather    Diabetes Daughter    Colon cancer Neg Hx    Colon polyps Neg Hx        Significant family history reported includes: Paternal uncle with bladder and kidney cancer   Mr. Kinnaird is unaware of previous family history of genetic testing for hereditary cancer risks.  There is no reported Ashkenazi Jewish ancestry. There is no known consanguinity  GENETIC TEST RESULTS: Genetic testing reported out on April 17, 2024 through the CancerNext-Expanded+RNAinsight cancer panel found no pathogenic mutations. The CancerNext-Expanded gene panel offered by [facility] and includes sequencing, rearrangement, and RNA analysis for the following 77 genes: AIP, ALK, APC, ATM, BAP1, BARD1, BMPR1A, BRCA1, BRCA2, BRIP1, CDC73, CDH1, CDK4, CDKN1B, CDKN2A, CEBPA, CHEK2, CTNNA1, DDX41, DICER1, ETV6, FH, FLCN, GATA2, LZTR1, MAX, MBD4, MEN1, MET, MLH1, MSH2, MSH3, MSH6, MUTYH, NF1, NF2, NTHL1, PALB2, PHOX2B, PMS2, POT1, PRKAR1A, PTCH1, PTEN, RAD51C, RAD51D, RB1, RET, RPS20, RUNX1, SDHA, SDHAF2, SDHB, SDHC, SDHD, SMAD4, SMARCA4, SMARCB1, SMARCE1, STK11, SUFU, TMEM127, TP53, TSC1, TSC2, VHL, and WT1 (  sequencing and deletion/duplication); AXIN2, CTNNA1, DDX41, EGFR, HOXB13, KIT, MBD4, MITF, MSH3, PDGFRA, POLD1 and POLE  (sequencing only); EPCAM and GREM1 (deletion/duplication only). RNA data is routinely analyzed for use in variant interpretation for all genes. The test report has been scanned into EPIC and is located under the Molecular Pathology section of the Results Review tab.  A portion of the result report is included below for reference.     We discussed with Mr. Escandon that because current genetic testing is not perfect, it is possible there may be a gene mutation in one of these genes that current testing cannot detect, but that chance is small.  We also discussed, that there could be another gene that has not yet been discovered, or that we have not yet tested, that is responsible for the cancer diagnoses in the family. It is also possible there is a hereditary cause for the cancer in the family that Mr. Mcmenamin did not inherit and therefore was not identified in his testing.  Therefore, it is important to remain in touch with cancer genetics in the future so that we can continue to offer Mr. Jasmer the most up to date genetic testing.   Genetic testing did identify a variant of uncertain significance (VUS) was identified in the HOXB13 gene called c.649C>T (p.R217C).  At this time, it is unknown if this variant is associated with increased cancer risk or if this is a normal finding, but most variants such as this get reclassified to being inconsequential. It should not be used to make medical management decisions. With time, we suspect the lab will determine the significance of this variant, if any. If we do learn more about it, we will try to contact Mr. Mundt to discuss it further. However, it is important to stay in touch with us  periodically and keep the address and phone number up to date.  ADDITIONAL GENETIC TESTING: We discussed with Mr. Bertoni that his genetic testing was fairly extensive.  If there are genes identified to increase cancer risk that can be analyzed in the future, we  would be happy to discuss and coordinate this testing at that time.    CANCER SCREENING RECOMMENDATIONS: Mr. Carneiro test result is considered negative (normal).  This means that we have not identified a hereditary cause for his personal and family history of cancer at this time. Most cancers happen by chance and this negative test suggests that his personal and family history of cancer may fall into this category.    Possible reasons for Mr. Reyez's negative genetic test include:  1. There may be a gene mutation in one of these genes that current testing methods cannot detect but that chance is small.  2. There could be another gene that has not yet been discovered, or that we have not yet tested, that is responsible for the cancer diagnoses in the family.  3.  There may be no hereditary risk for cancer in the family. The cancers in Mr. Kitchens and/or his family may be sporadic/familial or due to other genetic and environmental factors. 4. It is also possible there is a hereditary cause for the cancer in the family that Mr. Kocak did not inherit.  Therefore, it is recommended he continue to follow the cancer management and screening guidelines provided by his oncology and primary healthcare provider. An individual's cancer risk and medical management are not determined by genetic test results alone. Overall cancer risk assessment incorporates additional factors, including personal medical history, family  history, and any available genetic information that may result in a personalized plan for cancer prevention and surveillance  RECOMMENDATIONS FOR FAMILY MEMBERS:   Since he did not inherit a identifiable mutation in a cancer predisposition gene included on this panel, his children could not have inherited a known mutation from his in one of these genes. Individuals in this family might be at some increased risk of developing cancer, over the general population risk, simply due to the  family history of cancer.  We recommended women in this family have a yearly mammogram beginning at age 76, or 25 years younger than the earliest onset of cancer, an annual clinical breast exam, and perform monthly breast self-exams. Women in this family should also have a gynecological exam as recommended by their primary provider. All family members should be referred for colonoscopy starting at age 21, or 60 years younger than the earliest onset of cancer.  FOLLOW-UP: Lastly, we discussed with Mr. Pooley that cancer genetics is a rapidly advancing field and it is possible that new genetic tests will be appropriate for him and/or his family members in the future. We encouraged him to remain in contact with cancer genetics on an annual basis so we can update his personal and family histories and let him know of advances in cancer genetics that may benefit this family.   Our contact number was provided. Mr. Okuda questions were answered to his satisfaction, and he knows he is welcome to call us  at anytime with additional questions or concerns.   Darice Monte, MS, [facility] Licensed, Certified Genetic Counselor [email or web address]

## 2024-04-20 NOTE — Telephone Encounter (Signed)
 I contacted  Carlos Ford to discuss his genetic testing results. No pathogenic variants were identified in the 77 genes analyzed. HOXB13 p.R217C VUS was identified.  This will not change medical management.  Discussed that we do not know why he has prostate cancer or why there is cancer in the family. It could be due to a different gene that we are not testing, or maybe our current technology may not be able to pick something up.  It will be important for him to keep in contact with genetics to keep up with whether additional testing may be needed.Detailed clinic note to follow.   The test report will be scanned into EPIC and will be located under the Molecular Pathology section of the Results Review tab.  A portion of the result report is included below for reference.

## 2024-04-21 NOTE — Progress Notes (Signed)
 RN provided additional education on ADT, brachy boost, and 5 weeks of IMRT for patient.  Will continue to follow.

## 2024-05-03 ENCOUNTER — Ambulatory Visit: Admitting: Podiatry

## 2024-05-09 ENCOUNTER — Ambulatory Visit: Admitting: Family Medicine
